# Patient Record
Sex: Female | Born: 1945 | Race: White | Hispanic: No | Smoking: Never smoker
Health system: Southern US, Community
[De-identification: ages and names within clinical notes are randomized; demographics above are authoritative.]

## PROBLEM LIST (undated history)

## (undated) DIAGNOSIS — E119 Type 2 diabetes mellitus without complications: Secondary | ICD-10-CM

## (undated) DIAGNOSIS — M1990 Unspecified osteoarthritis, unspecified site: Secondary | ICD-10-CM

## (undated) DIAGNOSIS — M109 Gout, unspecified: Secondary | ICD-10-CM

## (undated) DIAGNOSIS — D649 Anemia, unspecified: Secondary | ICD-10-CM

## (undated) DIAGNOSIS — Z860100 Personal history of colon polyps, unspecified: Secondary | ICD-10-CM

## (undated) DIAGNOSIS — N289 Disorder of kidney and ureter, unspecified: Secondary | ICD-10-CM

## (undated) DIAGNOSIS — C801 Malignant (primary) neoplasm, unspecified: Secondary | ICD-10-CM

## (undated) DIAGNOSIS — Z9889 Other specified postprocedural states: Secondary | ICD-10-CM

## (undated) DIAGNOSIS — J40 Bronchitis, not specified as acute or chronic: Secondary | ICD-10-CM

## (undated) DIAGNOSIS — I2699 Other pulmonary embolism without acute cor pulmonale: Secondary | ICD-10-CM

## (undated) DIAGNOSIS — E7800 Pure hypercholesterolemia, unspecified: Secondary | ICD-10-CM

## (undated) DIAGNOSIS — I10 Essential (primary) hypertension: Secondary | ICD-10-CM

## (undated) HISTORY — DX: Other pulmonary embolism without acute cor pulmonale: I26.99

## (undated) HISTORY — PX: CHOLECYSTECTOMY: SHX55

## (undated) HISTORY — DX: Anemia, unspecified: D64.9

## (undated) HISTORY — PX: PARTIAL HYSTERECTOMY: SHX80

## (undated) HISTORY — DX: Gout, unspecified: M10.9

## (undated) HISTORY — PX: KNEE ARTHROSCOPY: SHX127

## (undated) HISTORY — PX: TONSILLECTOMY: SUR1361

## (undated) HISTORY — PX: OTHER SURGICAL HISTORY: SHX169

## (undated) HISTORY — PX: THYROIDECTOMY, PARTIAL: SHX18

---

## 2011-06-14 HISTORY — PX: COLONOSCOPY: SHX174

## 2011-09-12 DIAGNOSIS — N189 Chronic kidney disease, unspecified: Secondary | ICD-10-CM | POA: Insufficient documentation

## 2012-11-02 DIAGNOSIS — E039 Hypothyroidism, unspecified: Secondary | ICD-10-CM | POA: Insufficient documentation

## 2012-11-15 DIAGNOSIS — E781 Pure hyperglyceridemia: Secondary | ICD-10-CM | POA: Insufficient documentation

## 2012-11-29 DIAGNOSIS — G4733 Obstructive sleep apnea (adult) (pediatric): Secondary | ICD-10-CM | POA: Insufficient documentation

## 2013-05-20 DIAGNOSIS — E785 Hyperlipidemia, unspecified: Secondary | ICD-10-CM | POA: Insufficient documentation

## 2016-07-04 DIAGNOSIS — M1712 Unilateral primary osteoarthritis, left knee: Secondary | ICD-10-CM | POA: Diagnosis not present

## 2016-07-04 DIAGNOSIS — M7051 Other bursitis of knee, right knee: Secondary | ICD-10-CM | POA: Diagnosis not present

## 2016-07-05 DIAGNOSIS — M1712 Unilateral primary osteoarthritis, left knee: Secondary | ICD-10-CM | POA: Diagnosis not present

## 2016-07-05 DIAGNOSIS — M7051 Other bursitis of knee, right knee: Secondary | ICD-10-CM | POA: Diagnosis not present

## 2016-07-25 DIAGNOSIS — J9691 Respiratory failure, unspecified with hypoxia: Secondary | ICD-10-CM | POA: Insufficient documentation

## 2016-07-25 DIAGNOSIS — J9621 Acute and chronic respiratory failure with hypoxia: Secondary | ICD-10-CM | POA: Diagnosis not present

## 2016-07-25 DIAGNOSIS — R748 Abnormal levels of other serum enzymes: Secondary | ICD-10-CM | POA: Diagnosis not present

## 2016-07-25 DIAGNOSIS — N183 Chronic kidney disease, stage 3 (moderate): Secondary | ICD-10-CM | POA: Diagnosis not present

## 2016-07-25 DIAGNOSIS — I2699 Other pulmonary embolism without acute cor pulmonale: Secondary | ICD-10-CM | POA: Diagnosis not present

## 2016-07-25 DIAGNOSIS — R079 Chest pain, unspecified: Secondary | ICD-10-CM | POA: Diagnosis not present

## 2016-07-25 DIAGNOSIS — Z79899 Other long term (current) drug therapy: Secondary | ICD-10-CM | POA: Diagnosis not present

## 2016-07-25 DIAGNOSIS — E039 Hypothyroidism, unspecified: Secondary | ICD-10-CM | POA: Diagnosis not present

## 2016-07-25 DIAGNOSIS — Z885 Allergy status to narcotic agent status: Secondary | ICD-10-CM | POA: Diagnosis not present

## 2016-07-25 DIAGNOSIS — I5189 Other ill-defined heart diseases: Secondary | ICD-10-CM | POA: Diagnosis not present

## 2016-07-25 DIAGNOSIS — E785 Hyperlipidemia, unspecified: Secondary | ICD-10-CM | POA: Diagnosis not present

## 2016-07-25 DIAGNOSIS — G4730 Sleep apnea, unspecified: Secondary | ICD-10-CM | POA: Diagnosis not present

## 2016-07-25 DIAGNOSIS — R0902 Hypoxemia: Secondary | ICD-10-CM | POA: Diagnosis not present

## 2016-07-25 DIAGNOSIS — Z888 Allergy status to other drugs, medicaments and biological substances status: Secondary | ICD-10-CM | POA: Diagnosis not present

## 2016-07-25 DIAGNOSIS — K219 Gastro-esophageal reflux disease without esophagitis: Secondary | ICD-10-CM | POA: Diagnosis not present

## 2016-07-25 DIAGNOSIS — J9601 Acute respiratory failure with hypoxia: Secondary | ICD-10-CM | POA: Diagnosis not present

## 2016-07-25 DIAGNOSIS — I129 Hypertensive chronic kidney disease with stage 1 through stage 4 chronic kidney disease, or unspecified chronic kidney disease: Secondary | ICD-10-CM | POA: Diagnosis present

## 2016-07-25 DIAGNOSIS — Z91040 Latex allergy status: Secondary | ICD-10-CM | POA: Diagnosis not present

## 2016-08-12 DIAGNOSIS — Z09 Encounter for follow-up examination after completed treatment for conditions other than malignant neoplasm: Secondary | ICD-10-CM | POA: Diagnosis not present

## 2016-08-12 DIAGNOSIS — I2699 Other pulmonary embolism without acute cor pulmonale: Secondary | ICD-10-CM | POA: Diagnosis not present

## 2016-08-12 DIAGNOSIS — I10 Essential (primary) hypertension: Secondary | ICD-10-CM | POA: Diagnosis not present

## 2016-08-12 DIAGNOSIS — N183 Chronic kidney disease, stage 3 (moderate): Secondary | ICD-10-CM | POA: Diagnosis not present

## 2016-08-18 DIAGNOSIS — K219 Gastro-esophageal reflux disease without esophagitis: Secondary | ICD-10-CM | POA: Insufficient documentation

## 2016-08-24 DIAGNOSIS — H1013 Acute atopic conjunctivitis, bilateral: Secondary | ICD-10-CM | POA: Diagnosis not present

## 2016-08-24 DIAGNOSIS — H2513 Age-related nuclear cataract, bilateral: Secondary | ICD-10-CM | POA: Diagnosis not present

## 2016-08-24 DIAGNOSIS — H524 Presbyopia: Secondary | ICD-10-CM | POA: Diagnosis not present

## 2016-08-24 DIAGNOSIS — H40053 Ocular hypertension, bilateral: Secondary | ICD-10-CM | POA: Diagnosis not present

## 2016-08-24 DIAGNOSIS — H16223 Keratoconjunctivitis sicca, not specified as Sjogren's, bilateral: Secondary | ICD-10-CM | POA: Diagnosis not present

## 2016-08-24 DIAGNOSIS — I10 Essential (primary) hypertension: Secondary | ICD-10-CM | POA: Diagnosis not present

## 2016-08-26 DIAGNOSIS — I2699 Other pulmonary embolism without acute cor pulmonale: Secondary | ICD-10-CM | POA: Diagnosis not present

## 2016-08-26 DIAGNOSIS — N183 Chronic kidney disease, stage 3 (moderate): Secondary | ICD-10-CM | POA: Diagnosis not present

## 2016-08-26 DIAGNOSIS — G4730 Sleep apnea, unspecified: Secondary | ICD-10-CM | POA: Diagnosis not present

## 2016-08-26 DIAGNOSIS — I129 Hypertensive chronic kidney disease with stage 1 through stage 4 chronic kidney disease, or unspecified chronic kidney disease: Secondary | ICD-10-CM | POA: Diagnosis not present

## 2016-08-26 DIAGNOSIS — R609 Edema, unspecified: Secondary | ICD-10-CM | POA: Diagnosis not present

## 2016-08-31 DIAGNOSIS — M170 Bilateral primary osteoarthritis of knee: Secondary | ICD-10-CM | POA: Diagnosis not present

## 2016-08-31 DIAGNOSIS — I129 Hypertensive chronic kidney disease with stage 1 through stage 4 chronic kidney disease, or unspecified chronic kidney disease: Secondary | ICD-10-CM | POA: Diagnosis not present

## 2016-08-31 DIAGNOSIS — N183 Chronic kidney disease, stage 3 (moderate): Secondary | ICD-10-CM | POA: Diagnosis not present

## 2016-08-31 DIAGNOSIS — Z6837 Body mass index (BMI) 37.0-37.9, adult: Secondary | ICD-10-CM | POA: Diagnosis not present

## 2016-08-31 DIAGNOSIS — I2699 Other pulmonary embolism without acute cor pulmonale: Secondary | ICD-10-CM | POA: Diagnosis not present

## 2016-08-31 DIAGNOSIS — E669 Obesity, unspecified: Secondary | ICD-10-CM | POA: Diagnosis not present

## 2016-08-31 DIAGNOSIS — K219 Gastro-esophageal reflux disease without esophagitis: Secondary | ICD-10-CM | POA: Diagnosis not present

## 2016-08-31 DIAGNOSIS — E782 Mixed hyperlipidemia: Secondary | ICD-10-CM | POA: Diagnosis not present

## 2016-09-08 DIAGNOSIS — E785 Hyperlipidemia, unspecified: Secondary | ICD-10-CM | POA: Diagnosis not present

## 2016-09-08 DIAGNOSIS — Z5181 Encounter for therapeutic drug level monitoring: Secondary | ICD-10-CM | POA: Diagnosis not present

## 2016-09-08 DIAGNOSIS — N189 Chronic kidney disease, unspecified: Secondary | ICD-10-CM | POA: Diagnosis not present

## 2016-09-08 DIAGNOSIS — I2699 Other pulmonary embolism without acute cor pulmonale: Secondary | ICD-10-CM | POA: Diagnosis not present

## 2016-09-08 DIAGNOSIS — D649 Anemia, unspecified: Secondary | ICD-10-CM | POA: Diagnosis not present

## 2016-09-08 DIAGNOSIS — Z7901 Long term (current) use of anticoagulants: Secondary | ICD-10-CM | POA: Diagnosis not present

## 2016-09-08 DIAGNOSIS — Z6838 Body mass index (BMI) 38.0-38.9, adult: Secondary | ICD-10-CM | POA: Diagnosis not present

## 2016-09-08 DIAGNOSIS — D631 Anemia in chronic kidney disease: Secondary | ICD-10-CM | POA: Diagnosis not present

## 2016-09-08 DIAGNOSIS — E669 Obesity, unspecified: Secondary | ICD-10-CM | POA: Diagnosis not present

## 2016-09-08 DIAGNOSIS — Z1211 Encounter for screening for malignant neoplasm of colon: Secondary | ICD-10-CM | POA: Diagnosis not present

## 2016-09-08 DIAGNOSIS — I129 Hypertensive chronic kidney disease with stage 1 through stage 4 chronic kidney disease, or unspecified chronic kidney disease: Secondary | ICD-10-CM | POA: Diagnosis not present

## 2016-09-08 DIAGNOSIS — Z6839 Body mass index (BMI) 39.0-39.9, adult: Secondary | ICD-10-CM | POA: Diagnosis not present

## 2016-09-26 DIAGNOSIS — H1013 Acute atopic conjunctivitis, bilateral: Secondary | ICD-10-CM | POA: Diagnosis not present

## 2016-09-26 DIAGNOSIS — H16223 Keratoconjunctivitis sicca, not specified as Sjogren's, bilateral: Secondary | ICD-10-CM | POA: Diagnosis not present

## 2016-09-26 DIAGNOSIS — H11433 Conjunctival hyperemia, bilateral: Secondary | ICD-10-CM | POA: Diagnosis not present

## 2016-10-04 DIAGNOSIS — M7051 Other bursitis of knee, right knee: Secondary | ICD-10-CM | POA: Diagnosis not present

## 2016-10-04 DIAGNOSIS — M1712 Unilateral primary osteoarthritis, left knee: Secondary | ICD-10-CM | POA: Diagnosis not present

## 2016-12-01 DIAGNOSIS — I10 Essential (primary) hypertension: Secondary | ICD-10-CM | POA: Diagnosis not present

## 2016-12-01 DIAGNOSIS — E782 Mixed hyperlipidemia: Secondary | ICD-10-CM | POA: Diagnosis not present

## 2016-12-01 DIAGNOSIS — E039 Hypothyroidism, unspecified: Secondary | ICD-10-CM | POA: Diagnosis not present

## 2016-12-02 DIAGNOSIS — E782 Mixed hyperlipidemia: Secondary | ICD-10-CM | POA: Diagnosis not present

## 2016-12-02 DIAGNOSIS — K219 Gastro-esophageal reflux disease without esophagitis: Secondary | ICD-10-CM | POA: Diagnosis not present

## 2016-12-02 DIAGNOSIS — N183 Chronic kidney disease, stage 3 (moderate): Secondary | ICD-10-CM | POA: Diagnosis not present

## 2016-12-02 DIAGNOSIS — Z780 Asymptomatic menopausal state: Secondary | ICD-10-CM | POA: Diagnosis not present

## 2016-12-02 DIAGNOSIS — I2699 Other pulmonary embolism without acute cor pulmonale: Secondary | ICD-10-CM | POA: Diagnosis not present

## 2016-12-02 DIAGNOSIS — G4733 Obstructive sleep apnea (adult) (pediatric): Secondary | ICD-10-CM | POA: Diagnosis not present

## 2016-12-02 DIAGNOSIS — I129 Hypertensive chronic kidney disease with stage 1 through stage 4 chronic kidney disease, or unspecified chronic kidney disease: Secondary | ICD-10-CM | POA: Diagnosis not present

## 2016-12-02 DIAGNOSIS — Z9989 Dependence on other enabling machines and devices: Secondary | ICD-10-CM | POA: Diagnosis not present

## 2016-12-02 DIAGNOSIS — E039 Hypothyroidism, unspecified: Secondary | ICD-10-CM | POA: Diagnosis not present

## 2016-12-07 DIAGNOSIS — Z13820 Encounter for screening for osteoporosis: Secondary | ICD-10-CM | POA: Diagnosis not present

## 2016-12-07 DIAGNOSIS — M8588 Other specified disorders of bone density and structure, other site: Secondary | ICD-10-CM | POA: Diagnosis not present

## 2016-12-07 DIAGNOSIS — Z780 Asymptomatic menopausal state: Secondary | ICD-10-CM | POA: Diagnosis not present

## 2016-12-09 DIAGNOSIS — N189 Chronic kidney disease, unspecified: Secondary | ICD-10-CM | POA: Diagnosis not present

## 2017-01-05 DIAGNOSIS — M1712 Unilateral primary osteoarthritis, left knee: Secondary | ICD-10-CM | POA: Diagnosis not present

## 2017-01-05 DIAGNOSIS — M7051 Other bursitis of knee, right knee: Secondary | ICD-10-CM | POA: Diagnosis not present

## 2017-01-08 DIAGNOSIS — M7051 Other bursitis of knee, right knee: Secondary | ICD-10-CM | POA: Diagnosis not present

## 2017-01-08 DIAGNOSIS — M1712 Unilateral primary osteoarthritis, left knee: Secondary | ICD-10-CM | POA: Diagnosis not present

## 2017-01-19 DIAGNOSIS — Z9989 Dependence on other enabling machines and devices: Secondary | ICD-10-CM | POA: Diagnosis not present

## 2017-01-19 DIAGNOSIS — I10 Essential (primary) hypertension: Secondary | ICD-10-CM | POA: Diagnosis not present

## 2017-01-19 DIAGNOSIS — G4733 Obstructive sleep apnea (adult) (pediatric): Secondary | ICD-10-CM | POA: Diagnosis not present

## 2017-03-28 DIAGNOSIS — H40053 Ocular hypertension, bilateral: Secondary | ICD-10-CM | POA: Diagnosis not present

## 2017-03-28 DIAGNOSIS — H5340 Unspecified visual field defects: Secondary | ICD-10-CM | POA: Diagnosis not present

## 2017-03-28 DIAGNOSIS — H539 Unspecified visual disturbance: Secondary | ICD-10-CM | POA: Diagnosis not present

## 2017-04-05 DIAGNOSIS — E039 Hypothyroidism, unspecified: Secondary | ICD-10-CM | POA: Diagnosis not present

## 2017-04-05 DIAGNOSIS — J0100 Acute maxillary sinusitis, unspecified: Secondary | ICD-10-CM | POA: Diagnosis not present

## 2017-04-05 DIAGNOSIS — N183 Chronic kidney disease, stage 3 (moderate): Secondary | ICD-10-CM | POA: Diagnosis not present

## 2017-04-05 DIAGNOSIS — H539 Unspecified visual disturbance: Secondary | ICD-10-CM | POA: Diagnosis not present

## 2017-04-05 DIAGNOSIS — I129 Hypertensive chronic kidney disease with stage 1 through stage 4 chronic kidney disease, or unspecified chronic kidney disease: Secondary | ICD-10-CM | POA: Diagnosis not present

## 2017-04-05 DIAGNOSIS — E782 Mixed hyperlipidemia: Secondary | ICD-10-CM | POA: Diagnosis not present

## 2017-04-05 DIAGNOSIS — I2699 Other pulmonary embolism without acute cor pulmonale: Secondary | ICD-10-CM | POA: Diagnosis not present

## 2017-04-11 DIAGNOSIS — I6782 Cerebral ischemia: Secondary | ICD-10-CM | POA: Diagnosis not present

## 2017-04-11 DIAGNOSIS — M1712 Unilateral primary osteoarthritis, left knee: Secondary | ICD-10-CM | POA: Diagnosis not present

## 2017-04-11 DIAGNOSIS — H5340 Unspecified visual field defects: Secondary | ICD-10-CM | POA: Diagnosis not present

## 2017-04-11 DIAGNOSIS — I6523 Occlusion and stenosis of bilateral carotid arteries: Secondary | ICD-10-CM | POA: Diagnosis not present

## 2017-04-11 DIAGNOSIS — H539 Unspecified visual disturbance: Secondary | ICD-10-CM | POA: Diagnosis not present

## 2017-04-11 DIAGNOSIS — M7051 Other bursitis of knee, right knee: Secondary | ICD-10-CM | POA: Diagnosis not present

## 2017-06-14 DIAGNOSIS — Z6838 Body mass index (BMI) 38.0-38.9, adult: Secondary | ICD-10-CM | POA: Diagnosis not present

## 2017-06-14 DIAGNOSIS — J0100 Acute maxillary sinusitis, unspecified: Secondary | ICD-10-CM | POA: Diagnosis not present

## 2017-06-14 DIAGNOSIS — I10 Essential (primary) hypertension: Secondary | ICD-10-CM | POA: Diagnosis not present

## 2017-06-14 DIAGNOSIS — Z86711 Personal history of pulmonary embolism: Secondary | ICD-10-CM | POA: Diagnosis not present

## 2017-07-11 DIAGNOSIS — I10 Essential (primary) hypertension: Secondary | ICD-10-CM | POA: Diagnosis not present

## 2017-07-11 DIAGNOSIS — Z0000 Encounter for general adult medical examination without abnormal findings: Secondary | ICD-10-CM | POA: Diagnosis not present

## 2017-07-13 DIAGNOSIS — M5137 Other intervertebral disc degeneration, lumbosacral region: Secondary | ICD-10-CM | POA: Diagnosis not present

## 2017-07-13 DIAGNOSIS — M9905 Segmental and somatic dysfunction of pelvic region: Secondary | ICD-10-CM | POA: Diagnosis not present

## 2017-07-17 DIAGNOSIS — M5137 Other intervertebral disc degeneration, lumbosacral region: Secondary | ICD-10-CM | POA: Diagnosis not present

## 2017-07-17 DIAGNOSIS — M9905 Segmental and somatic dysfunction of pelvic region: Secondary | ICD-10-CM | POA: Diagnosis not present

## 2017-07-18 DIAGNOSIS — I129 Hypertensive chronic kidney disease with stage 1 through stage 4 chronic kidney disease, or unspecified chronic kidney disease: Secondary | ICD-10-CM | POA: Diagnosis not present

## 2017-07-18 DIAGNOSIS — M1712 Unilateral primary osteoarthritis, left knee: Secondary | ICD-10-CM | POA: Diagnosis not present

## 2017-07-18 DIAGNOSIS — E7800 Pure hypercholesterolemia, unspecified: Secondary | ICD-10-CM | POA: Diagnosis not present

## 2017-07-18 DIAGNOSIS — N184 Chronic kidney disease, stage 4 (severe): Secondary | ICD-10-CM | POA: Diagnosis not present

## 2017-07-18 DIAGNOSIS — Z0000 Encounter for general adult medical examination without abnormal findings: Secondary | ICD-10-CM | POA: Diagnosis not present

## 2017-07-18 DIAGNOSIS — Z86711 Personal history of pulmonary embolism: Secondary | ICD-10-CM | POA: Diagnosis not present

## 2017-07-18 DIAGNOSIS — Z6838 Body mass index (BMI) 38.0-38.9, adult: Secondary | ICD-10-CM | POA: Diagnosis not present

## 2017-07-18 DIAGNOSIS — M7051 Other bursitis of knee, right knee: Secondary | ICD-10-CM | POA: Diagnosis not present

## 2017-07-18 DIAGNOSIS — K219 Gastro-esophageal reflux disease without esophagitis: Secondary | ICD-10-CM | POA: Diagnosis not present

## 2017-07-19 DIAGNOSIS — M9905 Segmental and somatic dysfunction of pelvic region: Secondary | ICD-10-CM | POA: Diagnosis not present

## 2017-07-19 DIAGNOSIS — M5137 Other intervertebral disc degeneration, lumbosacral region: Secondary | ICD-10-CM | POA: Diagnosis not present

## 2017-07-20 DIAGNOSIS — M9905 Segmental and somatic dysfunction of pelvic region: Secondary | ICD-10-CM | POA: Diagnosis not present

## 2017-07-20 DIAGNOSIS — M5137 Other intervertebral disc degeneration, lumbosacral region: Secondary | ICD-10-CM | POA: Diagnosis not present

## 2017-07-24 DIAGNOSIS — M9905 Segmental and somatic dysfunction of pelvic region: Secondary | ICD-10-CM | POA: Diagnosis not present

## 2017-07-24 DIAGNOSIS — M5137 Other intervertebral disc degeneration, lumbosacral region: Secondary | ICD-10-CM | POA: Diagnosis not present

## 2017-07-26 DIAGNOSIS — M5137 Other intervertebral disc degeneration, lumbosacral region: Secondary | ICD-10-CM | POA: Diagnosis not present

## 2017-07-26 DIAGNOSIS — M9905 Segmental and somatic dysfunction of pelvic region: Secondary | ICD-10-CM | POA: Diagnosis not present

## 2017-07-27 DIAGNOSIS — M5137 Other intervertebral disc degeneration, lumbosacral region: Secondary | ICD-10-CM | POA: Diagnosis not present

## 2017-07-27 DIAGNOSIS — M9905 Segmental and somatic dysfunction of pelvic region: Secondary | ICD-10-CM | POA: Diagnosis not present

## 2017-07-31 DIAGNOSIS — M9905 Segmental and somatic dysfunction of pelvic region: Secondary | ICD-10-CM | POA: Diagnosis not present

## 2017-07-31 DIAGNOSIS — M5137 Other intervertebral disc degeneration, lumbosacral region: Secondary | ICD-10-CM | POA: Diagnosis not present

## 2017-08-01 DIAGNOSIS — M9905 Segmental and somatic dysfunction of pelvic region: Secondary | ICD-10-CM | POA: Diagnosis not present

## 2017-08-01 DIAGNOSIS — M5137 Other intervertebral disc degeneration, lumbosacral region: Secondary | ICD-10-CM | POA: Diagnosis not present

## 2017-08-03 DIAGNOSIS — M9905 Segmental and somatic dysfunction of pelvic region: Secondary | ICD-10-CM | POA: Diagnosis not present

## 2017-08-03 DIAGNOSIS — M5137 Other intervertebral disc degeneration, lumbosacral region: Secondary | ICD-10-CM | POA: Diagnosis not present

## 2017-08-07 DIAGNOSIS — M9905 Segmental and somatic dysfunction of pelvic region: Secondary | ICD-10-CM | POA: Diagnosis not present

## 2017-08-07 DIAGNOSIS — M5137 Other intervertebral disc degeneration, lumbosacral region: Secondary | ICD-10-CM | POA: Diagnosis not present

## 2017-09-28 DIAGNOSIS — H40053 Ocular hypertension, bilateral: Secondary | ICD-10-CM | POA: Diagnosis not present

## 2017-10-03 DIAGNOSIS — J302 Other seasonal allergic rhinitis: Secondary | ICD-10-CM | POA: Diagnosis not present

## 2017-10-04 DIAGNOSIS — J302 Other seasonal allergic rhinitis: Secondary | ICD-10-CM | POA: Diagnosis not present

## 2017-10-05 DIAGNOSIS — J302 Other seasonal allergic rhinitis: Secondary | ICD-10-CM | POA: Diagnosis not present

## 2017-10-06 DIAGNOSIS — J302 Other seasonal allergic rhinitis: Secondary | ICD-10-CM | POA: Diagnosis not present

## 2017-10-07 DIAGNOSIS — J302 Other seasonal allergic rhinitis: Secondary | ICD-10-CM | POA: Diagnosis not present

## 2017-10-09 DIAGNOSIS — J302 Other seasonal allergic rhinitis: Secondary | ICD-10-CM | POA: Diagnosis not present

## 2017-10-10 DIAGNOSIS — J302 Other seasonal allergic rhinitis: Secondary | ICD-10-CM | POA: Diagnosis not present

## 2017-10-11 DIAGNOSIS — I2699 Other pulmonary embolism without acute cor pulmonale: Secondary | ICD-10-CM

## 2017-10-11 HISTORY — DX: Other pulmonary embolism without acute cor pulmonale: I26.99

## 2017-10-13 ENCOUNTER — Encounter (HOSPITAL_COMMUNITY): Payer: Self-pay | Admitting: Emergency Medicine

## 2017-10-13 ENCOUNTER — Emergency Department (HOSPITAL_COMMUNITY): Payer: Medicare Other

## 2017-10-13 ENCOUNTER — Observation Stay (HOSPITAL_COMMUNITY)
Admission: EM | Admit: 2017-10-13 | Discharge: 2017-10-14 | Disposition: A | Discharge status: Home / Self Care | Payer: Medicare Other | Attending: Internal Medicine | Admitting: Internal Medicine

## 2017-10-13 ENCOUNTER — Other Ambulatory Visit: Payer: Self-pay

## 2017-10-13 DIAGNOSIS — N289 Disorder of kidney and ureter, unspecified: Secondary | ICD-10-CM | POA: Diagnosis not present

## 2017-10-13 DIAGNOSIS — Z6839 Body mass index (BMI) 39.0-39.9, adult: Secondary | ICD-10-CM | POA: Diagnosis not present

## 2017-10-13 DIAGNOSIS — Z9889 Other specified postprocedural states: Secondary | ICD-10-CM

## 2017-10-13 DIAGNOSIS — Z86711 Personal history of pulmonary embolism: Secondary | ICD-10-CM | POA: Diagnosis not present

## 2017-10-13 DIAGNOSIS — I129 Hypertensive chronic kidney disease with stage 1 through stage 4 chronic kidney disease, or unspecified chronic kidney disease: Secondary | ICD-10-CM | POA: Diagnosis not present

## 2017-10-13 DIAGNOSIS — R0602 Shortness of breath: Secondary | ICD-10-CM | POA: Diagnosis not present

## 2017-10-13 DIAGNOSIS — N182 Chronic kidney disease, stage 2 (mild): Secondary | ICD-10-CM | POA: Insufficient documentation

## 2017-10-13 DIAGNOSIS — Z9049 Acquired absence of other specified parts of digestive tract: Secondary | ICD-10-CM | POA: Diagnosis not present

## 2017-10-13 DIAGNOSIS — M1990 Unspecified osteoarthritis, unspecified site: Secondary | ICD-10-CM | POA: Diagnosis present

## 2017-10-13 DIAGNOSIS — Z7901 Long term (current) use of anticoagulants: Secondary | ICD-10-CM | POA: Insufficient documentation

## 2017-10-13 DIAGNOSIS — I8290 Acute embolism and thrombosis of unspecified vein: Secondary | ICD-10-CM | POA: Insufficient documentation

## 2017-10-13 DIAGNOSIS — Z79899 Other long term (current) drug therapy: Secondary | ICD-10-CM | POA: Insufficient documentation

## 2017-10-13 DIAGNOSIS — Z888 Allergy status to other drugs, medicaments and biological substances status: Secondary | ICD-10-CM | POA: Insufficient documentation

## 2017-10-13 DIAGNOSIS — E7800 Pure hypercholesterolemia, unspecified: Secondary | ICD-10-CM | POA: Insufficient documentation

## 2017-10-13 DIAGNOSIS — K449 Diaphragmatic hernia without obstruction or gangrene: Secondary | ICD-10-CM | POA: Insufficient documentation

## 2017-10-13 DIAGNOSIS — I10 Essential (primary) hypertension: Secondary | ICD-10-CM | POA: Diagnosis not present

## 2017-10-13 DIAGNOSIS — Z860100 Personal history of colon polyps, unspecified: Secondary | ICD-10-CM

## 2017-10-13 DIAGNOSIS — Z86718 Personal history of other venous thrombosis and embolism: Secondary | ICD-10-CM | POA: Diagnosis not present

## 2017-10-13 DIAGNOSIS — I2699 Other pulmonary embolism without acute cor pulmonale: Principal | ICD-10-CM | POA: Diagnosis present

## 2017-10-13 DIAGNOSIS — E669 Obesity, unspecified: Secondary | ICD-10-CM | POA: Insufficient documentation

## 2017-10-13 DIAGNOSIS — Z886 Allergy status to analgesic agent status: Secondary | ICD-10-CM | POA: Diagnosis not present

## 2017-10-13 DIAGNOSIS — R05 Cough: Secondary | ICD-10-CM | POA: Diagnosis not present

## 2017-10-13 DIAGNOSIS — I700 Atherosclerosis of aorta: Secondary | ICD-10-CM | POA: Diagnosis not present

## 2017-10-13 DIAGNOSIS — Z885 Allergy status to narcotic agent status: Secondary | ICD-10-CM | POA: Diagnosis not present

## 2017-10-13 HISTORY — DX: Personal history of colon polyps, unspecified: Z86.0100

## 2017-10-13 HISTORY — DX: Pure hypercholesterolemia, unspecified: E78.00

## 2017-10-13 HISTORY — DX: Disorder of kidney and ureter, unspecified: N28.9

## 2017-10-13 HISTORY — DX: Other specified postprocedural states: Z86.010

## 2017-10-13 HISTORY — DX: Essential (primary) hypertension: I10

## 2017-10-13 HISTORY — DX: Unspecified osteoarthritis, unspecified site: M19.90

## 2017-10-13 HISTORY — DX: Bronchitis, not specified as acute or chronic: J40

## 2017-10-13 HISTORY — DX: Other specified postprocedural states: Z98.890

## 2017-10-13 LAB — HEPATIC FUNCTION PANEL
ALT: 31 U/L (ref 14–54)
AST: 39 U/L (ref 15–41)
Albumin: 4.2 g/dL (ref 3.5–5.0)
Alkaline Phosphatase: 99 U/L (ref 38–126)
Bilirubin, Direct: 0.1 mg/dL (ref 0.1–0.5)
Indirect Bilirubin: 0.7 mg/dL (ref 0.3–0.9)
Total Bilirubin: 0.8 mg/dL (ref 0.3–1.2)
Total Protein: 7.6 g/dL (ref 6.5–8.1)

## 2017-10-13 LAB — TROPONIN I
Troponin I: <0.03 ng/mL (ref <0.03)
Troponin I: <0.03 ng/mL (ref <0.03)

## 2017-10-13 LAB — CBC WITH DIFFERENTIAL/PLATELET
Basophils Absolute: 0 10*3/uL (ref 0.0–0.1)
Basophils Relative: 0 %
Eosinophils Absolute: 0.2 10*3/uL (ref 0.0–0.7)
Eosinophils Relative: 3 %
HCT: 38.4 % (ref 36.0–46.0)
Hemoglobin: 12.4 g/dL (ref 12.0–15.0)
Lymphocytes Relative: 26 %
Lymphs Abs: 2.3 10*3/uL (ref 0.7–4.0)
MCH: 29.5 pg (ref 26.0–34.0)
MCHC: 32.3 g/dL (ref 30.0–36.0)
MCV: 91.4 fL (ref 78.0–100.0)
Monocytes Absolute: 0.7 10*3/uL (ref 0.1–1.0)
Monocytes Relative: 7 %
Neutro Abs: 5.6 10*3/uL (ref 1.7–7.7)
Neutrophils Relative %: 64 %
Platelets: 252 10*3/uL (ref 150–400)
RBC: 4.2 MIL/uL (ref 3.87–5.11)
RDW: 14.6 % (ref 11.5–15.5)
WBC: 8.8 10*3/uL (ref 4.0–10.5)

## 2017-10-13 LAB — BASIC METABOLIC PANEL
Anion gap: 15 (ref 5–15)
BUN: 28 mg/dL — ABNORMAL HIGH (ref 6–20)
CO2: 23 mmol/L (ref 22–32)
Calcium: 9.7 mg/dL (ref 8.9–10.3)
Chloride: 97 mmol/L — ABNORMAL LOW (ref 101–111)
Creatinine, Ser: 1.58 mg/dL — ABNORMAL HIGH (ref 0.44–1.00)
GFR calc Af Amer: 37 mL/min — ABNORMAL LOW (ref >60)
GFR calc non Af Amer: 32 mL/min — ABNORMAL LOW (ref >60)
Glucose, Bld: 101 mg/dL — ABNORMAL HIGH (ref 65–99)
Potassium: 3.8 mmol/L (ref 3.5–5.1)
Sodium: 135 mmol/L (ref 135–145)

## 2017-10-13 LAB — D-DIMER, QUANTITATIVE (NOT AT ARMC): D-Dimer, Quant: 2.6 ug/mL-FEU — ABNORMAL HIGH (ref 0.00–0.50)

## 2017-10-13 LAB — PROTIME-INR
INR: 1.09
Prothrombin Time: 14 seconds (ref 11.4–15.2)

## 2017-10-13 MED ORDER — TRIAMTERENE-HCTZ 37.5-25 MG PO TABS
1.0000 | ORAL_TABLET | Freq: Every morning | ORAL | Status: DC
  Administered 2017-10-14: 1 via ORAL
  Filled 2017-10-13: qty 1

## 2017-10-13 MED ORDER — IOPAMIDOL (ISOVUE-370) INJECTION 76%
100.0000 mL | Freq: Once | INTRAVENOUS | Status: AC | PRN
  Administered 2017-10-13: 75 mL via INTRAVENOUS

## 2017-10-13 MED ORDER — SODIUM CHLORIDE 0.9 % IV BOLUS
1000.0000 mL | Freq: Once | INTRAVENOUS | Status: AC
  Administered 2017-10-13: 1000 mL via INTRAVENOUS

## 2017-10-13 MED ORDER — HEPARIN BOLUS VIA INFUSION
4000.0000 [IU] | Freq: Once | INTRAVENOUS | Status: AC
  Administered 2017-10-13: 4000 [IU] via INTRAVENOUS

## 2017-10-13 MED ORDER — SODIUM CHLORIDE 0.9% FLUSH: 3.0000 mL | INTRAVENOUS | Status: DC | PRN

## 2017-10-13 MED ORDER — SODIUM CHLORIDE 0.9 % IV SOLN: 250.0000 mL | INTRAVENOUS | Status: DC | PRN

## 2017-10-13 MED ORDER — CYCLOBENZAPRINE HCL 10 MG PO TABS: 10.0000 mg | ORAL_TABLET | Freq: Every day | ORAL | Status: DC | PRN

## 2017-10-13 MED ORDER — ONDANSETRON HCL 4 MG/2ML IJ SOLN: 4.0000 mg | Freq: Four times a day (QID) | INTRAMUSCULAR | Status: DC | PRN

## 2017-10-13 MED ORDER — ALBUTEROL SULFATE (2.5 MG/3ML) 0.083% IN NEBU: 5.0000 mg | INHALATION_SOLUTION | Freq: Once | RESPIRATORY_TRACT | Status: DC

## 2017-10-13 MED ORDER — FUROSEMIDE 20 MG PO TABS
20.0000 mg | ORAL_TABLET | Freq: Every day | ORAL | Status: DC | PRN
Start: 2017-10-13 — End: 2017-10-15

## 2017-10-13 MED ORDER — HEPARIN (PORCINE) IN NACL 100-0.45 UNIT/ML-% IJ SOLN
1300.0000 [IU]/h | INTRAMUSCULAR | Status: DC
Start: 2017-10-13 — End: 2017-10-14
  Administered 2017-10-13: 1000 [IU]/h via INTRAVENOUS
  Filled 2017-10-13: qty 250

## 2017-10-13 MED ORDER — ONDANSETRON HCL 4 MG PO TABS: 4.0000 mg | ORAL_TABLET | Freq: Four times a day (QID) | ORAL | Status: DC | PRN

## 2017-10-13 MED ORDER — SODIUM CHLORIDE 0.9% FLUSH
3.0000 mL | Freq: Two times a day (BID) | INTRAVENOUS | Status: DC
  Administered 2017-10-14: 3 mL via INTRAVENOUS

## 2017-10-13 MED ORDER — LISINOPRIL 10 MG PO TABS
40.0000 mg | ORAL_TABLET | Freq: Every evening | ORAL | Status: DC
  Administered 2017-10-13: 40 mg via ORAL
  Filled 2017-10-13: qty 4

## 2017-10-13 NOTE — Progress Notes (Signed)
ANTICOAGULATION CONSULT NOTE - Initial Consult  Pharmacy Consult for Heparin Indication: pulmonary embolus  Allergies  Allergen Reactions  . Hydrocodone Other (See Comments)    Altered mental status-Hallucination  . Naproxen Other (See Comments)    Damage to kidney Kidney damage   . Statins Other (See Comments)    Unknown reaction    Patient Measurements: Height: 5\' 6"  (167.6 cm) Weight: 242 lb (109.8 kg) IBW/kg (Calculated) : 59.3  HEPARIN DW (KG): 84.8  Vital Signs: Temp: 97.9 F (36.6 C) (05/03 1551) Temp Source: Oral (05/03 1551) BP: 185/84 (05/03 2006) Pulse Rate: 88 (05/03 2007)  Labs: Recent Labs    10/13/17 1635  HGB 12.4  HCT 38.4  PLT 252  CREATININE 1.58*  TROPONINI <0.03    Estimated Creatinine Clearance: 41 mL/min (A) (by C-G formula based on SCr of 1.58 mg/dL (H)).   Medical History: Past Medical History:  Diagnosis Date  . Arthritis   . Bronchitis   . H/O colonoscopy with polypectomy   . Hypercholesteremia   . Hypertension   . Renal disorder     Medications:  See med rec  Assessment: 72 y.o. 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 She presents to ED with over 2 weeks of worsening shortness of breath and dyspnea on exertion.  No pain or swelling in  her legs. Patient found to have bilateral pulmonary emboli again and referred for admission . Pharmacy asked to start heparin.   Goal of Therapy:  Heparin level 0.3-0.7 units/ml Monitor platelets by anticoagulation protocol: Yes   Plan:  Give 4000 units bolus x 1 Start heparin infusion at 1400 units/hr Check anti-Xa level in 6-8 hours and daily while on heparin Continue to monitor H&H and platelets F/U plan for transition to po  Isac Sarna, BS Vena [place], BCPS Clinical Pharmacist Pager [phone] 10/13/2017,8:35 PM

## 2017-10-13 NOTE — ED Notes (Signed)
Lung sounds clear but decreased

## 2017-10-13 NOTE — ED Provider Notes (Signed)
[facility] EMERGENCY DEPARTMENT Provider Note   CSN: [id number] Arrival date & time: 10/13/17  1533     History   Chief Complaint Chief Complaint  Patient presents with  . Shortness of Breath    HPI Lauren Hamilton is a 72 y.o. female.  Patient complains of shortness of breath.  Patient has a history of PE.  And has been off her blood thinner for about 3 months and has been short of breath for 3 weeks  The history is provided by the patient. No language interpreter was used.  Shortness of Breath  This is a recurrent problem. The current episode started more than 1 week ago. The problem has not changed since onset.Pertinent negatives include no fever, no headaches, no cough, no chest pain, no abdominal pain and no rash. Precipitated by: Unknown. Risk factors: Obese. She has tried nothing for the symptoms. The treatment provided no relief. She has had prior hospitalizations. She has had prior ED visits. She has had prior ICU admissions. Associated medical issues do not include asthma.    Past Medical History:  Diagnosis Date  . Arthritis   . Bronchitis   . Hypercholesteremia   . Hypertension   . Renal disorder     Patient Active Problem List   Diagnosis Date Noted  . Pulmonary embolism ([place]) 10/13/2017    Past Surgical History:  Procedure Laterality Date  . carpel tunnel    . CHOLECYSTECTOMY    . KNEE ARTHROSCOPY       OB History   None      Home Medications    Prior to Admission medications   Medication Sig Start Date End Date Taking? Authorizing Provider  Artificial Tear Solution (SOOTHE XP) SOLN Apply 1-2 drops to eye daily.   Yes [provider]  cetirizine (ZYRTEC) 10 MG tablet Take 10 mg by mouth every morning.   Yes [provider]  cyclobenzaprine (FLEXERIL) 10 MG tablet Take 10 mg by mouth daily as needed for muscle spasms.   Yes [provider]  furosemide (LASIX) 20 MG tablet Take 20 mg by mouth daily as needed for fluid.    Yes [provider]  lisinopril (PRINIVIL,ZESTRIL) 40 MG tablet Take 40 mg by mouth every evening.  10/03/17  Yes [provider]  lovastatin (MEVACOR) 10 MG tablet Take 10 mg by mouth at bedtime.   Yes [provider]  omeprazole (PRILOSEC) 20 MG capsule Take 20 mg by mouth every morning.   Yes [provider]  SALINE NASAL MIST NA Place 1-2 sprays into the nose daily as needed.   Yes [provider]  triamterene-hydrochlorothiazide (MAXZIDE-25) 37.5-25 MG tablet Take 1 tablet by mouth every morning.   Yes [provider]    Family History History reviewed. No pertinent family history.  Social History Social History   Tobacco Use  . Smoking status: Never Smoker  . Smokeless tobacco: Never Used  Substance Use Topics  . Alcohol use: Never    Frequency: Never  . Drug use: Never     Allergies   Hydrocodone; Naproxen; and Statins   Review of Systems Review of Systems  Constitutional: Negative for appetite change, fatigue and fever.  HENT: Negative for congestion, ear discharge and sinus pressure.   Eyes: Negative for discharge.  Respiratory: Positive for shortness of breath. Negative for cough.   Cardiovascular: Negative for chest pain.  Gastrointestinal: Negative for abdominal pain and diarrhea.  Genitourinary: Negative for frequency and hematuria.  Musculoskeletal:  Negative for back pain.  Skin: Negative for rash.  Neurological: Negative for seizures and headaches.  Psychiatric/Behavioral: Negative for hallucinations.     Physical Exam Updated Vital Signs BP 138/79   Pulse 95   Temp 97.9 F (36.6 C) (Oral)   Resp 20   SpO2 97%   Physical Exam  Constitutional: She is oriented to person, place, and time. She appears well-developed.  HENT:  Head: Normocephalic.  Eyes: Conjunctivae and EOM are normal. No scleral icterus.  Neck: Neck supple. No thyromegaly present.  Cardiovascular: Normal rate and regular  rhythm. Exam reveals no gallop and no friction rub.  No murmur heard. Pulmonary/Chest: No stridor. She has no wheezes. She has no rales. She exhibits no tenderness.  Abdominal: She exhibits no distension. There is no tenderness. There is no rebound.  Musculoskeletal: Normal range of motion. She exhibits no edema.  Lymphadenopathy:    She has no cervical adenopathy.  Neurological: She is oriented to person, place, and time. She exhibits normal muscle tone. Coordination normal.  Skin: No rash noted. No erythema.  Psychiatric: She has a normal mood and affect. Her behavior is normal.     ED Treatments / Results  Labs (all labs ordered are listed, but only abnormal results are displayed) Labs Reviewed  BASIC METABOLIC PANEL - Abnormal; Notable for the following components:      Result Value   Chloride 97 (*)    Glucose, Bld 101 (*)    BUN 28 (*)    Creatinine, Ser 1.58 (*)    GFR calc non Af Amer 32 (*)    GFR calc Af Amer 37 (*)    All other components within normal limits  D-DIMER, QUANTITATIVE (NOT AT [facility]) - Abnormal; Notable for the following components:   D-Dimer, Quant 2.60 (*)    All other components within normal limits  CBC WITH DIFFERENTIAL/PLATELET  TROPONIN I  HEPATIC FUNCTION PANEL    EKG None  Radiology Dg Chest 2 View  Result Date: 10/13/2017 CLINICAL DATA:  Cough and short of breath EXAM: CHEST - 2 VIEW COMPARISON:  None. FINDINGS: The heart size and mediastinal contours are within normal limits. Both lungs are clear. The visualized skeletal structures are unremarkable. IMPRESSION: No active cardiopulmonary disease. Electronically Signed   By: Franchot Gallo M.D.   On: 10/13/2017 16:05   Ct Angio Chest Pe W And/or Wo Contrast  Result Date: 10/13/2017 CLINICAL DATA:  Worsening shortness of breath. History of blood clots. EXAM: CT ANGIOGRAPHY CHEST WITH CONTRAST TECHNIQUE: Multidetector CT imaging of the chest was performed using the standard protocol during bolus  administration of intravenous contrast. Multiplanar CT image reconstructions and MIPs were obtained to evaluate the vascular anatomy. CONTRAST:  39mL ISOVUE-370 IOPAMIDOL (ISOVUE-370) INJECTION 76% COMPARISON:  Chest x-ray from same day. FINDINGS: Cardiovascular: Lobar and segmental pulmonary emboli involving the right upper and lower lobes. Segmental pulmonary emboli involving the left upper lobe, left lower lobe, and right middle lobe. No right heart strain. RV/LV ratio is 0.85. Borderline cardiomegaly. Trace pericardial effusion. Normal caliber thoracic aorta. Coronary, aortic arch, and branch vessel atherosclerotic vascular disease. Mediastinum/Nodes: No enlarged mediastinal, hilar, or axillary lymph nodes. Prior left hemithyroidectomy. The trachea and esophagus demonstrate no significant findings. Small hiatal hernia. Lungs/Pleura: Scattered geographic ground-glass density throughout both lungs could reflect atelectasis versus mosaic perfusion. No focal consolidation, pleural effusion, or pneumothorax. No suspicious pulmonary nodule. Upper Abdomen: No acute abnormality. Musculoskeletal: No chest wall abnormality. No acute or significant osseous findings. Review  of the MIP images confirms the above findings. IMPRESSION: 1. Bilateral lobar and segmental pulmonary emboli with borderline right heart strain. RV/LV ratio is 0.85. 2. Borderline cardiomegaly.  Trace pericardial effusion. 3.  Aortic atherosclerosis (ICD10-I70.0). Critical Value/emergent results were called by telephone at the time of interpretation on 10/13/2017 at 7:32 pm to Dr. Milton Ferguson , who verbally acknowledged these results. Electronically Signed   By: Titus Dubin M.D.   On: 10/13/2017 19:35    Procedures Procedures (including critical care time)  Medications Ordered in ED Medications  sodium chloride 0.9 % bolus 1,000 mL (1,000 mLs Intravenous New Bag/Given 10/13/17 1936)  heparin bolus via infusion 4,000 Units (has no  administration in time range)  heparin ADULT infusion 100 units/mL (25000 units/262mL sodium chloride 0.45%) (has no administration in time range)  iopamidol (ISOVUE-370) 76 % injection 100 mL (75 mLs Intravenous Contrast Given 10/13/17 1903)     Initial Impression / Assessment and Plan / ED Course  I have reviewed the triage vital signs and the nursing notes.  Pertinent labs & imaging results that were available during my care of the patient were reviewed by me and considered in my medical decision making (see chart for details).    CRITICAL CARE Performed by: Milton Ferguson Total critical care time: 45 minutes Critical care time was exclusive of separately billable procedures and treating other patients. Critical care was necessary to treat or prevent imminent or life-threatening deterioration. Critical care was time spent personally by me on the following activities: development of treatment plan with patient and/or surrogate as well as nursing, discussions with consultants, evaluation of patient's response to treatment, examination of patient, obtaining history from patient or surrogate, ordering and performing treatments and interventions, ordering and review of laboratory studies, ordering and review of radiographic studies, pulse oximetry and re-evaluation of patient's condition.   Patient with bilateral PEs seen on CT angios.  She will be placed on heparin and admitted to medicine  Final Clinical Impressions(s) / ED Diagnoses   Final diagnoses:  PE (pulmonary thromboembolism) [facility])    ED Discharge Orders    None       Milton Ferguson, MD 10/13/17 2006

## 2017-10-13 NOTE — ED Notes (Signed)
ED Provider at bedside. 

## 2017-10-13 NOTE — ED Notes (Signed)
Vitals obtained.  Pt knitting in the room and wants to ambulate in the room or sit in the chair so unable to having continuous VS

## 2017-10-13 NOTE — H&P (Signed)
History and Physical    Lyndy Russman ZCH:[id number] DOB: Aug 01, 1945 DOA: 10/13/2017  PCP: Medicine, [facility]  Patient coming from: Home  Chief Complaint: Shortness of breath  HPI: Monserrath Junio is a 72 y.o. 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  Patient denies any swelling her legs.  She called her primary care provider who advised her to come the emergency department for concerns of another PE.  Patient has been off of her anticoagulation since February.  She did complete a year therapy for her last PE.  Patient reports she did not get a cancer work-up from her last pulmonary emboli.  She has a history of having 3 polyps removed from her last colonoscopy which was 3 years ago and she is refused to have another one since.  She denies any rectal bleeding.  She gets her yearly mammograms done and they have been normal per her report.  Patient found to have bilateral pulmonary emboli again and referred for admission for such.  She is afebrile her vital signs are stable.  She is in start on heparin drip.   Review of Systems: As per HPI otherwise 10 point review of systems negative.   Past Medical History:  Diagnosis Date  . Arthritis   . Bronchitis   . H/O colonoscopy with polypectomy   . Hypercholesteremia   . Hypertension   . Renal disorder     Past Surgical History:  Procedure Laterality Date  . carpel tunnel    . CHOLECYSTECTOMY    . KNEE ARTHROSCOPY       reports that she has never smoked. She has never used smokeless tobacco. She reports that she does not drink alcohol or use drugs.  Allergies  Allergen Reactions  . Hydrocodone Other (See Comments)    Altered mental status-Hallucination  . Naproxen Other (See Comments)   Damage to kidney Kidney damage   . Statins Other (See Comments)    Unknown reaction    History reviewed. No pertinent family history.  No premature coronary artery disease  Prior to Admission medications   Medication Sig Start Date End Date Taking? Authorizing Provider  Artificial Tear Solution (SOOTHE XP) SOLN Apply 1-2 drops to eye daily.   Yes [provider]  cetirizine (ZYRTEC) 10 MG tablet Take 10 mg by mouth every morning.   Yes [provider]  cyclobenzaprine (FLEXERIL) 10 MG tablet Take 10 mg by mouth daily as needed for muscle spasms.   Yes [provider]  furosemide (LASIX) 20 MG tablet Take 20 mg by mouth daily as needed for fluid.   Yes [provider]  lisinopril (PRINIVIL,ZESTRIL) 40 MG tablet Take 40 mg by mouth every evening.  10/03/17  Yes [provider]  lovastatin (MEVACOR) 10 MG tablet Take 10 mg by mouth at bedtime.   Yes [provider]  omeprazole (PRILOSEC) 20 MG capsule Take 20 mg by mouth every morning.   Yes [provider]  SALINE NASAL MIST NA Place 1-2 sprays into the nose daily as needed.   Yes [provider]  triamterene-hydrochlorothiazide (MAXZIDE-25) 37.5-25 MG tablet Take 1 tablet by mouth every morning.   Yes [provider]    Physical Exam: Vitals:   10/13/17 1551 10/13/17 1746 10/13/17 1748  10/13/17 1939  BP: (!) 154/90 138/79    Pulse: 90  85 95  Resp: 20   20  Temp: 97.9 F (36.6 C)     TempSrc: Oral     SpO2: 95%  93% 97%    Constitutional: NAD, calm, comfortable Vitals:   10/13/17 1551 10/13/17 1746 10/13/17 1748 10/13/17 1939  BP: (!) 154/90 138/79    Pulse: 90  85 95  Resp: 20   20  Temp: 97.9 F (36.6 C)     TempSrc: Oral     SpO2: 95%  93% 97%   Eyes: PERRL, lids and conjunctivae normal ENMT: Mucous membranes are moist. Posterior pharynx clear of any exudate or lesions.Normal dentition.  Neck: normal, supple, no masses, no  thyromegaly Respiratory: clear to auscultation bilaterally, no wheezing, no crackles. Normal respiratory effort. No accessory muscle use.  Cardiovascular: Regular rate and rhythm, no murmurs / rubs / gallops. No extremity edema. 2+ pedal pulses. No carotid bruits.  Abdomen: no tenderness, no masses palpated. No hepatosplenomegaly. Bowel sounds positive.  Musculoskeletal: no clubbing / cyanosis. No joint deformity upper and lower extremities. Good ROM, no contractures. Normal muscle tone.  Skin: no rashes, lesions, ulcers. No induration Neurologic: CN 2-12 grossly intact. Sensation intact, DTR normal. Strength 5/5 in all 4.  Psychiatric: Normal judgment and insight. Alert and oriented x 3. Normal mood.    Labs on Admission: I have personally reviewed following labs and imaging studies  CBC: Recent Labs  Lab 10/13/17 1635  WBC 8.8  NEUTROABS 5.6  HGB 12.4  HCT 38.4  MCV 91.4  PLT 789   Basic Metabolic Panel: Recent Labs  Lab 10/13/17 1635  NA 135  K 3.8  CL 97*  CO2 23  GLUCOSE 101*  BUN 28*  CREATININE 1.58*  CALCIUM 9.7   GFR: CrCl cannot be calculated (Unknown ideal weight.). Liver Function Tests: Recent Labs  Lab 10/13/17 1723  AST 39  ALT 31  ALKPHOS 99  BILITOT 0.8  PROT 7.6  ALBUMIN 4.2   No results for input(s): LIPASE, AMYLASE in the last 168 hours. No results for input(s): AMMONIA in the last 168 hours. Coagulation Profile: No results for input(s): INR, PROTIME in the last 168 hours. Cardiac Enzymes: Recent Labs  Lab 10/13/17 1635  TROPONINI <0.03   BNP (last 3 results) No results for input(s): PROBNP in the last 8760 hours. HbA1C: No results for input(s): HGBA1C in the last 72 hours. CBG: No results for input(s): GLUCAP in the last 168 hours. Lipid Profile: No results for input(s): CHOL, HDL, LDLCALC, TRIG, CHOLHDL, LDLDIRECT in the last 72 hours. Thyroid Function Tests: No results for input(s): TSH, T4TOTAL, FREET4, T3FREE, THYROIDAB in  the last 72 hours. Anemia Panel: No results for input(s): VITAMINB12, FOLATE, FERRITIN, TIBC, IRON, RETICCTPCT in the last 72 hours. Urine analysis: No results found for: COLORURINE, APPEARANCEUR, LABSPEC, PHURINE, GLUCOSEU, HGBUR, BILIRUBINUR, KETONESUR, PROTEINUR, UROBILINOGEN, NITRITE, LEUKOCYTESUR Sepsis Labs: !!!!!!!!!!!!!!!!!!!!!!!!!!!!!!!!!!!!!!!!!!!! @LABRCNTIP (procalcitonin:4,lacticidven:4) )No results found for this or any previous visit (from the past 240 hour(s)).   Radiological Exams on Admission: Dg Chest 2 View  Result Date: 10/13/2017 CLINICAL DATA:  Cough and short of breath EXAM: CHEST - 2 VIEW COMPARISON:  None. FINDINGS: The heart size and mediastinal contours are within normal limits. Both lungs are clear. The visualized skeletal structures are unremarkable. IMPRESSION: No active cardiopulmonary disease. Electronically Signed   By: Franchot Gallo M.D.   On: 10/13/2017 16:05   Ct Angio Chest Pe W And/or Wo  Contrast  Result Date: 10/13/2017 CLINICAL DATA:  Worsening shortness of breath. History of blood clots. EXAM: CT ANGIOGRAPHY CHEST WITH CONTRAST TECHNIQUE: Multidetector CT imaging of the chest was performed using the standard protocol during bolus administration of intravenous contrast. Multiplanar CT image reconstructions and MIPs were obtained to evaluate the vascular anatomy. CONTRAST:  37mL ISOVUE-370 IOPAMIDOL (ISOVUE-370) INJECTION 76% COMPARISON:  Chest x-ray from same day. FINDINGS: Cardiovascular: Lobar and segmental pulmonary emboli involving the right upper and lower lobes. Segmental pulmonary emboli involving the left upper lobe, left lower lobe, and right middle lobe. No right heart strain. RV/LV ratio is 0.85. Borderline cardiomegaly. Trace pericardial effusion. Normal caliber thoracic aorta. Coronary, aortic arch, and branch vessel atherosclerotic vascular disease. Mediastinum/Nodes: No enlarged mediastinal, hilar, or axillary lymph nodes. Prior left  hemithyroidectomy. The trachea and esophagus demonstrate no significant findings. Small hiatal hernia. Lungs/Pleura: Scattered geographic ground-glass density throughout both lungs could reflect atelectasis versus mosaic perfusion. No focal consolidation, pleural effusion, or pneumothorax. No suspicious pulmonary nodule. Upper Abdomen: No acute abnormality. Musculoskeletal: No chest wall abnormality. No acute or significant osseous findings. Review of the MIP images confirms the above findings. IMPRESSION: 1. Bilateral lobar and segmental pulmonary emboli with borderline right heart strain. RV/LV ratio is 0.85. 2. Borderline cardiomegaly.  Trace pericardial effusion. 3.  Aortic atherosclerosis (ICD10-I70.0). Critical Value/emergent results were called by telephone at the time of interpretation on 10/13/2017 at 7:32 pm to Dr. Milton Ferguson , who verbally acknowledged these results. Electronically Signed   By: Titus Dubin M.D.   On: 10/13/2017 19:35    EKG: Independently reviewed.  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  Troponin is normal.  She will need lifelong anticoagulation at this time.  I discussed with her-the importance of cancer screening including colonoscopy she is going to think about this.  She is due for one due to her polypectomy x3 about 3 years ago.  Place patient on heparin drip.  Can transition back to Xarelto.  She cannot afford her medication in the past and she was getting free samples from her primary care physician every month.  We will leave up to PCP on whether to put her on Coumadin for life.  Active Problems:   H/O colonoscopy with  polypectomy-as above considering repeating colonoscopy   Hypertension stable-   Arthritis-stable   Renal disorder stable-    DVT prophylaxis: Heparin drip Code Status: Full Family Communication: Daughter Disposition Plan: Per day team Consults called: None Admission status: Observation   DAVID,RACHAL A MD [facility]  If 7PM-7AM, please contact night-coverage [email or web address] Password [email or web address]  10/13/2017, 8:08 PM

## 2017-10-13 NOTE — ED Triage Notes (Addendum)
Pt c/o sob. Hx of bilateral blood clots. Sob since then which was months ago. Sob mildy worse x 2 weeks. A/o. Ambulatory. Denies pain. No ble edema noted. Prod cough with yellow phlegm x 3 months

## 2017-10-14 DIAGNOSIS — I2699 Other pulmonary embolism without acute cor pulmonale: Secondary | ICD-10-CM | POA: Diagnosis not present

## 2017-10-14 DIAGNOSIS — I2602 Saddle embolus of pulmonary artery with acute cor pulmonale: Secondary | ICD-10-CM | POA: Diagnosis not present

## 2017-10-14 LAB — BASIC METABOLIC PANEL
Anion gap: 11 (ref 5–15)
BUN: 23 mg/dL — ABNORMAL HIGH (ref 6–20)
CO2: 23 mmol/L (ref 22–32)
Calcium: 9.3 mg/dL (ref 8.9–10.3)
Chloride: 103 mmol/L (ref 101–111)
Creatinine, Ser: 1.34 mg/dL — ABNORMAL HIGH (ref 0.44–1.00)
GFR calc Af Amer: 45 mL/min — ABNORMAL LOW (ref >60)
GFR calc non Af Amer: 39 mL/min — ABNORMAL LOW (ref >60)
Glucose, Bld: 108 mg/dL — ABNORMAL HIGH (ref 65–99)
Potassium: 4 mmol/L (ref 3.5–5.1)
Sodium: 137 mmol/L (ref 135–145)

## 2017-10-14 LAB — TROPONIN I
Troponin I: <0.03 ng/mL (ref <0.03)
Troponin I: <0.03 ng/mL (ref <0.03)

## 2017-10-14 LAB — CBC
HCT: 36.2 % (ref 36.0–46.0)
Hemoglobin: 11.7 g/dL — ABNORMAL LOW (ref 12.0–15.0)
MCH: 29.6 pg (ref 26.0–34.0)
MCHC: 32.3 g/dL (ref 30.0–36.0)
MCV: 91.6 fL (ref 78.0–100.0)
Platelets: 241 10*3/uL (ref 150–400)
RBC: 3.95 MIL/uL (ref 3.87–5.11)
RDW: 14.7 % (ref 11.5–15.5)
WBC: 8.4 10*3/uL (ref 4.0–10.5)

## 2017-10-14 LAB — MRSA PCR SCREENING: MRSA by PCR: NEGATIVE

## 2017-10-14 LAB — ANTITHROMBIN III: AntiThromb III Func: 103 % (ref 75–120)

## 2017-10-14 LAB — HEPARIN LEVEL (UNFRACTIONATED): Heparin Unfractionated: 0.77 IU/mL — ABNORMAL HIGH (ref 0.30–0.70)

## 2017-10-14 MED ORDER — RIVAROXABAN (XARELTO) VTE STARTER PACK (15 & 20 MG): ORAL_TABLET | ORAL | 0 refills | Status: DC

## 2017-10-14 MED ORDER — RIVAROXABAN 15 MG PO TABS
15.0000 mg | ORAL_TABLET | Freq: Two times a day (BID) | ORAL | Status: DC
  Administered 2017-10-14 (×2): 15 mg via ORAL
  Filled 2017-10-14 (×3): qty 1

## 2017-10-14 MED ORDER — LISINOPRIL 10 MG PO TABS
40.0000 mg | ORAL_TABLET | Freq: Every morning | ORAL | Status: DC
  Administered 2017-10-14: 40 mg via ORAL
  Filled 2017-10-14: qty 4

## 2017-10-14 MED ORDER — RIVAROXABAN 20 MG PO TABS: 20.0000 mg | ORAL_TABLET | Freq: Every day | ORAL | 3 refills | Status: DC

## 2017-10-14 NOTE — Discharge Summary (Addendum)
Physician Discharge Summary  Adri Schloss ZOX:[id number] DOB: 1945/10/28 DOA: 10/13/2017  PCP: [facility]  Admit date: 10/13/2017 Discharge date: 10/14/2017  Time spent: 45 minutes  Recommendations for Outpatient Follow-up:  -To be discharged home today. -Has been started on Xarelto for pulmonary embolism.  As this is her second event she is now on lifelong anticoagulation. -She has been provided a free 30-day card, has been instructed to follow-up with PCP within the next 1 to 2 weeks to discuss further assistance with anticoagulation. -Hypercoagulable panel has been ordered and will need to be followed at time of hospital follow-up. -She also needs to be up-to-date on her cancer screening, she is due for repeat colonoscopy. -Desaturates to the mid 80s with ambulation although remains in the low 90s at rest.  We will send her home with 2 L of oxygen to use with activity.  Suspect this can be discontinued over the course of the next 2 to 3 weeks, oxygen levels will need to be assessed at time of hospital follow-up.  Discharge Diagnoses:  Principal Problem:   Pulmonary embolism ([place]) Active Problems:   Hypertension   Arthritis   Renal disorder   H/O colonoscopy with polypectomy   Discharge Condition: Stable and improved  Filed Weights   10/13/17 2033 10/14/17 0400  Weight: 109.8 kg (242 lb) 111.2 kg (245 lb 2.4 oz)    History of present illness:  As per Dr. Shanon Brow on 5/3: Lauren Hamilton is a 72 y.o. 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  Patient denies any swelling her legs.  She called her primary care provider who advised her to come the emergency department for concerns of another PE.  Patient has been off of her  anticoagulation since February.  She did complete a year therapy for her last PE.  Patient reports she did not get a cancer work-up from her last pulmonary emboli.  She has a history of having 3 polyps removed from her last colonoscopy which was 3 years ago and she is refused to have another one since.  She denies any rectal bleeding.  She gets her yearly mammograms done and they have been normal per her report.  Patient found to have bilateral pulmonary emboli again and referred for admission for such.  She is afebrile her vital signs are stable.  She is in start on heparin drip.    Hospital Course:   Bilateral pulmonary embolism -This is her second episode of unprovoked VTE, she finished a 1 year anticoagulation course in February. -I have not recommended lifelong anticoagulation, she wishes to resume Xarelto which is what she had been on in the past. -Case management has provided her with a 30-day card. -Hypercoagulable panel has been ordered with results pending at time of discharge and will need to be followed up. -She has been advised to stay up-to-date on her cancer screening, she is due for repeat colonoscopy. -As above, will send home with oxygen 2 L to use with activity, suspect she can be weaned off this over the course of the next 2 to 4 weeks.  Hypertension -Stable  Chronic kidney disease stage II -Creatinine stable   Procedures:  None  Consultations:  None  Discharge Instructions  Discharge Instructions  Diet - low sodium heart healthy   Complete by:  As directed    Increase activity slowly   Complete by:  As directed      Allergies as of 10/14/2017      Reactions   Hydrocodone Other (See Comments)   Altered mental status-Hallucination   Naproxen Other (See Comments)   Damage to kidney Kidney damage   Statins Other (See Comments)   Unknown reaction      Medication List    TAKE these medications   cetirizine 10 MG tablet Commonly known as:   ZYRTEC Take 10 mg by mouth every morning.   cyclobenzaprine 10 MG tablet Commonly known as:  FLEXERIL Take 10 mg by mouth daily as needed for muscle spasms.   furosemide 20 MG tablet Commonly known as:  LASIX Take 20 mg by mouth daily as needed for fluid.   lisinopril 40 MG tablet Commonly known as:  PRINIVIL,ZESTRIL Take 40 mg by mouth every evening.   lovastatin 10 MG tablet Commonly known as:  MEVACOR Take 10 mg by mouth at bedtime.   omeprazole 20 MG capsule Commonly known as:  PRILOSEC Take 20 mg by mouth every morning.   Rivaroxaban 15 & 20 MG Tbpk Take as directed on package: Start with one 15mg  tablet by mouth twice a day with food. On Day 22, switch to one 20mg  tablet once a day with food.   rivaroxaban 20 MG Tabs tablet Commonly known as:  XARELTO Take 1 tablet (20 mg total) by mouth daily with supper. Start using after you complete the starter pack   SALINE NASAL MIST NA Place 1-2 sprays into the nose daily as needed.   SOOTHE XP Soln Apply 1-2 drops to eye daily.   triamterene-hydrochlorothiazide 37.5-25 MG tablet Commonly known as:  MAXZIDE-25 Take 1 tablet by mouth every morning.      Allergies  Allergen Reactions  . Hydrocodone Other (See Comments)    Altered mental status-Hallucination  . Naproxen Other (See Comments)    Damage to kidney Kidney damage   . Statins Other (See Comments)    Unknown reaction   Follow-up Information    [facility]. Schedule an appointment as soon as possible for a visit in 2 week(s).   Specialty:  Internal Medicine Contact information: [place] [address] [phone]            The results of significant diagnostics from this hospitalization (including imaging, microbiology, ancillary and laboratory) are listed below for reference.    Significant Diagnostic Studies: Dg Chest 2 View  Result Date: 10/13/2017 CLINICAL DATA:  Cough and short of breath EXAM: CHEST - 2  VIEW COMPARISON:  None. FINDINGS: The heart size and mediastinal contours are within normal limits. Both lungs are clear. The visualized skeletal structures are unremarkable. IMPRESSION: No active cardiopulmonary disease. Electronically Signed   By: Franchot Gallo M.D.   On: 10/13/2017 16:05   Ct Angio Chest Pe W And/or Wo Contrast  Result Date: 10/13/2017 CLINICAL DATA:  Worsening shortness of breath. History of blood clots. EXAM: CT ANGIOGRAPHY CHEST WITH CONTRAST TECHNIQUE: Multidetector CT imaging of the chest was performed using the standard protocol during bolus administration of intravenous contrast. Multiplanar CT image reconstructions and MIPs were obtained to evaluate the vascular anatomy. CONTRAST:  51mL ISOVUE-370 IOPAMIDOL (ISOVUE-370) INJECTION 76% COMPARISON:  Chest x-ray from same day. FINDINGS: Cardiovascular: Lobar and segmental pulmonary emboli involving the right upper and lower lobes. Segmental pulmonary  emboli involving the left upper lobe, left lower lobe, and right middle lobe. No right heart strain. RV/LV ratio is 0.85. Borderline cardiomegaly. Trace pericardial effusion. Normal caliber thoracic aorta. Coronary, aortic arch, and branch vessel atherosclerotic vascular disease. Mediastinum/Nodes: No enlarged mediastinal, hilar, or axillary lymph nodes. Prior left hemithyroidectomy. The trachea and esophagus demonstrate no significant findings. Small hiatal hernia. Lungs/Pleura: Scattered geographic ground-glass density throughout both lungs could reflect atelectasis versus mosaic perfusion. No focal consolidation, pleural effusion, or pneumothorax. No suspicious pulmonary nodule. Upper Abdomen: No acute abnormality. Musculoskeletal: No chest wall abnormality. No acute or significant osseous findings. Review of the MIP images confirms the above findings. IMPRESSION: 1. Bilateral lobar and segmental pulmonary emboli with borderline right heart strain. RV/LV ratio is 0.85. 2. Borderline  cardiomegaly.  Trace pericardial effusion. 3.  Aortic atherosclerosis (ICD10-I70.0). Critical Value/emergent results were called by telephone at the time of interpretation on 10/13/2017 at 7:32 pm to Dr. Milton Ferguson , who verbally acknowledged these results. Electronically Signed   By: Titus Dubin M.D.   On: 10/13/2017 19:35    Microbiology: Recent Results (from the past 240 hour(s))  MRSA PCR Screening     Status: None   Collection Time: 10/13/17  9:30 PM  Result Value Ref Range Status   MRSA by PCR NEGATIVE NEGATIVE Final    Comment:        The GeneXpert MRSA Assay (FDA approved for NASAL specimens only), is one component of a comprehensive MRSA colonization surveillance program. It is not intended to diagnose MRSA infection nor to guide or monitor treatment for MRSA infections. Performed at [facility], [address], [place], [place] [address]      Labs: Basic Metabolic Panel: Recent Labs  Lab 10/13/17 1635 10/14/17 0416  NA 135 137  K 3.8 4.0  CL 97* 103  CO2 23 23  GLUCOSE 101* 108*  BUN 28* 23*  CREATININE 1.58* 1.34*  CALCIUM 9.7 9.3   Liver Function Tests: Recent Labs  Lab 10/13/17 1723  AST 39  ALT 31  ALKPHOS 99  BILITOT 0.8  PROT 7.6  ALBUMIN 4.2   No results for input(s): LIPASE, AMYLASE in the last 168 hours. No results for input(s): AMMONIA in the last 168 hours. CBC: Recent Labs  Lab 10/13/17 1635 10/14/17 0416  WBC 8.8 8.4  NEUTROABS 5.6  --   HGB 12.4 11.7*  HCT 38.4 36.2  MCV 91.4 91.6  PLT 252 241   Cardiac Enzymes: Recent Labs  Lab 10/13/17 1635 10/13/17 2042 10/14/17 0416 10/14/17 0812  TROPONINI <0.03 <0.03 <0.03 <0.03   BNP: BNP (last 3 results) No results for input(s): BNP in the last 8760 hours.  ProBNP (last 3 results) No results for input(s): PROBNP in the last 8760 hours.  CBG: No results for input(s): GLUCAP in the last 168 hours.     Signed:  Lelon Frohlich  [facility] Pager:  [phone] 10/14/2017, 11:42 AM

## 2017-10-14 NOTE — Progress Notes (Signed)
ANTICOAGULATION CONSULT NOTE - follow up [place] for Heparin Indication: pulmonary embolus  Allergies  Allergen Reactions  . Hydrocodone Other (See Comments)    Altered mental status-Hallucination  . Naproxen Other (See Comments)    Damage to kidney Kidney damage   . Statins Other (See Comments)    Unknown reaction    Patient Measurements: Height: 5\' 6"  (167.6 cm) Weight: 245 lb 2.4 oz (111.2 kg) IBW/kg (Calculated) : 59.3  HEPARIN DW (KG): 84.8  Vital Signs: Temp: 98.3 F (36.8 C) (05/04 0400) Temp Source: Axillary (05/04 0000) BP: 145/82 (05/04 0600) Pulse Rate: 84 (05/04 0600)  Labs: Recent Labs    10/13/17 1635 10/13/17 2042 10/14/17 0416  HGB 12.4  --  11.7*  HCT 38.4  --  36.2  PLT 252  --  241  LABPROT  --  14.0  --   INR  --  1.09  --   HEPARINUNFRC  --   --  0.77*  CREATININE 1.58*  --  1.34*  TROPONINI <0.03 <0.03 <0.03    Estimated Creatinine Clearance: 48.7 mL/min (A) (by C-G formula based on SCr of 1.34 mg/dL (H)).   Medical History: Past Medical History:  Diagnosis Date  . Arthritis   . Bronchitis   . H/O colonoscopy with polypectomy   . Hypercholesteremia   . Hypertension   . Renal disorder     Medications:  See med rec  Assessment: 72 y.o. 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 She presents to ED with over 2 weeks of worsening shortness of breath and dyspnea on exertion.  No pain or swelling in  her legs. Patient found to have bilateral pulmonary emboli again and referred for admission . Pharmacy asked to start heparin. HL is slightly above goal this AM  Goal of Therapy:  Heparin level 0.3-0.7 units/ml Monitor platelets by anticoagulation protocol: Yes   Plan:  Decrease heparin infusion to 1300 units/hr Check anti-Xa level in 6-8 hours and daily while on  heparin Continue to monitor H&H and platelets F/U plan for transition to po  Isac Sarna, BS [place], BCPS Clinical Pharmacist Pager [phone] 10/14/2017,7:51 AM

## 2017-10-14 NOTE — Progress Notes (Signed)
ANTICOAGULATION CONSULT NOTE - follow up [place] for Heparin--> xarelto Indication: pulmonary embolus  Allergies  Allergen Reactions  . Hydrocodone Other (See Comments)    Altered mental status-Hallucination  . Naproxen Other (See Comments)    Damage to kidney Kidney damage   . Statins Other (See Comments)    Unknown reaction    Patient Measurements: Height: 5\' 6"  (167.6 cm) Weight: 245 lb 2.4 oz (111.2 kg) IBW/kg (Calculated) : 59.3  HEPARIN DW (KG): 84.8  Vital Signs: Temp: 97.9 F (36.6 C) (05/04 0800) Temp Source: Oral (05/04 0800) BP: 145/68 (05/04 0800) Pulse Rate: 77 (05/04 0800)  Labs: Recent Labs    10/13/17 1635 10/13/17 2042 10/14/17 0416 10/14/17 0812  HGB 12.4  --  11.7*  --   HCT 38.4  --  36.2  --   PLT 252  --  241  --   LABPROT  --  14.0  --   --   INR  --  1.09  --   --   HEPARINUNFRC  --   --  0.77*  --   CREATININE 1.58*  --  1.34*  --   TROPONINI <0.03 <0.03 <0.03 <0.03    Estimated Creatinine Clearance: 48.7 mL/min (A) (by C-G formula based on SCr of 1.34 mg/dL (H)).   Medical History: Past Medical History:  Diagnosis Date  . Arthritis   . Bronchitis   . H/O colonoscopy with polypectomy   . Hypercholesteremia   . Hypertension   . Renal disorder     Medications:  See med rec  Assessment: 72 y.o. 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 She presents to ED with over 2 weeks of worsening shortness of breath and dyspnea on exertion.  No pain or swelling in  her legs. Patient found to have bilateral pulmonary emboli again and referred for admission . Pharmacy asked to transition to xarelto  Goal of Therapy:  Monitor platelets by anticoagulation protocol: Yes   Plan:  D/c heparin Xarelto 15mg  po BID with food for 21 days, then 20mg  daily Monitor for S/S of bleeding Educate  on [place], BS Pharm D, BCPS Clinical Pharmacist Pager [phone] 10/14/2017,10:57 AM

## 2017-10-14 NOTE — Progress Notes (Signed)
Faxed Xaralto 30 day card to nurses station at [phone]. Please call for any questions. [place] RN [employer] [phone]

## 2017-10-14 NOTE — Progress Notes (Signed)
Patient's O2 sat noted to be dropping mid-low 80's on RA. Dr. Jerilee Hoh made aware- patient still able to be discharged home with O2. Patient currently on 2 L [place] sat 96-98%.

## 2017-10-14 NOTE — Progress Notes (Signed)
Patient Saturations on Room Air at Rest = 88-96%   Patient Saturations on [employer] while Ambulating = 84-88%  Patient Saturations on 2 Liters of oxygen while Ambulating = 94%

## 2017-10-14 NOTE — Care Management Note (Signed)
Case Management Note  Patient Details  Name: Lauren Hamilton MRN: [id number] Date of Birth: 10-30-1945  Subjective/Objective:                 Oxygen referral made to [facility]. Portable tank will be delivered to room today by 5pm for patient to use in transport home. Jennye Moccasin card faxed to RN station, verified with nurse that it was received, and will go home with patient. No other CM needs.   Action/Plan:   Expected Discharge Date:  10/14/17               Expected Discharge Plan:  Home/Self Care  In-House Referral:     Discharge planning Services  CM Consult  Post Acute Care Choice:    Choice offered to:     DME Arranged:  Oxygen DME Agency:  [place]:    [facility] Agency:     Status of Service:  Completed, signed off  If discussed at [place] of Stay Meetings, dates discussed:    Additional Comments:  Carles Collet, RN 10/14/2017, 1:51 PM

## 2017-10-16 LAB — HOMOCYSTEINE: Homocysteine: 16.3 umol/L — ABNORMAL HIGH (ref 0.0–15.0)

## 2017-10-17 LAB — PROTEIN S ACTIVITY: Protein S Activity: 115 % (ref 63–140)

## 2017-10-17 LAB — PROTEIN S, TOTAL: Protein S Ag, Total: 123 % (ref 60–150)

## 2017-10-17 LAB — DRVVT CONFIRM: dRVVT Confirm: 1.4 ratio — ABNORMAL HIGH (ref 0.8–1.2)

## 2017-10-17 LAB — LUPUS ANTICOAGULANT PANEL
DRVVT: 76.7 s — ABNORMAL HIGH (ref 0.0–47.0)
PTT Lupus Anticoagulant: 44.8 s (ref 0.0–51.9)

## 2017-10-17 LAB — BETA-2-GLYCOPROTEIN I ABS, IGG/M/A
Beta-2 Glyco I IgG: <9 GPI IgG units (ref 0–20)
Beta-2-Glycoprotein I IgA: <9 GPI IgA units (ref 0–25)
Beta-2-Glycoprotein I IgM: <9 GPI IgM units (ref 0–32)

## 2017-10-17 LAB — CARDIOLIPIN ANTIBODIES, IGG, IGM, IGA
Anticardiolipin IgA: <9 APL U/mL (ref 0–11)
Anticardiolipin IgG: <9 GPL U/mL (ref 0–14)
Anticardiolipin IgM: 16 MPL U/mL — ABNORMAL HIGH (ref 0–12)

## 2017-10-17 LAB — DRVVT MIX: dRVVT Mix: 58.9 s — ABNORMAL HIGH (ref 0.0–47.0)

## 2017-10-17 LAB — PROTEIN C ACTIVITY: Protein C Activity: 140 % (ref 73–180)

## 2017-10-18 LAB — PROTEIN C, TOTAL: Protein C, Total: 117 % (ref 60–150)

## 2017-10-18 LAB — PROTHROMBIN GENE MUTATION

## 2017-10-20 LAB — FACTOR 5 LEIDEN

## 2017-10-27 DIAGNOSIS — I129 Hypertensive chronic kidney disease with stage 1 through stage 4 chronic kidney disease, or unspecified chronic kidney disease: Secondary | ICD-10-CM | POA: Diagnosis not present

## 2017-10-27 DIAGNOSIS — I2699 Other pulmonary embolism without acute cor pulmonale: Secondary | ICD-10-CM | POA: Diagnosis not present

## 2017-10-27 DIAGNOSIS — N184 Chronic kidney disease, stage 4 (severe): Secondary | ICD-10-CM | POA: Diagnosis not present

## 2017-11-08 DIAGNOSIS — Z1212 Encounter for screening for malignant neoplasm of rectum: Secondary | ICD-10-CM | POA: Diagnosis not present

## 2017-11-08 DIAGNOSIS — Z1211 Encounter for screening for malignant neoplasm of colon: Secondary | ICD-10-CM | POA: Diagnosis not present

## 2017-11-13 DIAGNOSIS — R05 Cough: Secondary | ICD-10-CM | POA: Diagnosis not present

## 2017-11-13 DIAGNOSIS — Z6838 Body mass index (BMI) 38.0-38.9, adult: Secondary | ICD-10-CM | POA: Diagnosis not present

## 2017-11-13 DIAGNOSIS — K219 Gastro-esophageal reflux disease without esophagitis: Secondary | ICD-10-CM | POA: Diagnosis not present

## 2017-11-13 DIAGNOSIS — R1314 Dysphagia, pharyngoesophageal phase: Secondary | ICD-10-CM | POA: Diagnosis not present

## 2017-11-13 DIAGNOSIS — I129 Hypertensive chronic kidney disease with stage 1 through stage 4 chronic kidney disease, or unspecified chronic kidney disease: Secondary | ICD-10-CM | POA: Diagnosis not present

## 2017-11-13 DIAGNOSIS — N184 Chronic kidney disease, stage 4 (severe): Secondary | ICD-10-CM | POA: Diagnosis not present

## 2018-01-05 ENCOUNTER — Encounter: Payer: Self-pay | Admitting: Gastroenterology

## 2018-01-11 ENCOUNTER — Encounter: Payer: Self-pay | Admitting: Allergy & Immunology

## 2018-01-11 ENCOUNTER — Ambulatory Visit (INDEPENDENT_AMBULATORY_CARE_PROVIDER_SITE_OTHER): Payer: Medicare Other | Admitting: Allergy & Immunology

## 2018-01-11 VITALS — BP 118/62 | HR 94 | Temp 98.1°F | Ht 65.5 in | Wt 225.2 lb

## 2018-01-11 DIAGNOSIS — T781XXD Other adverse food reactions, not elsewhere classified, subsequent encounter: Secondary | ICD-10-CM

## 2018-01-11 DIAGNOSIS — R05 Cough: Secondary | ICD-10-CM

## 2018-01-11 DIAGNOSIS — R059 Cough, unspecified: Secondary | ICD-10-CM

## 2018-01-11 DIAGNOSIS — J310 Chronic rhinitis: Secondary | ICD-10-CM

## 2018-01-11 NOTE — Patient Instructions (Addendum)
1. Cough - Spirometry looked good today, but it did improve slightly with the use of albuterol.  - We will give you a sample of Symbicort to see if this helps with your cough.  - Spacer sample and demonstration provided. - Daily controller medication(s): Symbicort 160/4.13mcg two puffs twice daily with spacer - Prior to physical activity: ProAir 2 puffs 10-15 minutes before physical activity. - Rescue medications: ProAir 4 puffs every 4-6 hours as needed - Asthma control goals:  * Full participation in all desired activities (may need albuterol before activity) * Albuterol use two time or less a week on average (not counting use with activity) * Cough interfering with sleep two time or less a month * Oral steroids no more than once a year * No hospitalizations   2. Chronic rhinitis - We will get the shots overnighted to [place] and you can get your next shot on Tuesday in [place]. - Continue with nasal saline rinses 1-2 times daily as needed.  - Continue with montelukast 10mg  daily.  - EpiPen is up to date.   3. Adverse food reaction (corn) - I am not convinced that the corn is related to your symptoms at all. - Since there is no improvement with the cough with avoidance of corn, I would just reintroduce corn back into your diet.  4. Return in about 4 days (around 01/15/2018) for alergy shot ([place]).   Please inform us of any Emergency Department visits, hospitalizations, or changes in symptoms. Call us before going to the ED for breathing or allergy symptoms since we might be able to fit you in for a sick visit. Feel free to contact us anytime with any questions, problems, or concerns.  It was a pleasure to meet you today!  Websites that have reliable patient information: 1. American Academy of Asthma, Allergy, and Immunology: www.aaaai.org 2. Food Allergy Research and Education (FARE): foodallergy.org 3. Mothers of Asthmatics: http://www.asthmacommunitynetwork.org 4. American  College of Allergy, Asthma, and Immunology: MonthlyElectricBill.co.uk   Make sure you are registered to vote! If you have moved or changed any of your contact information, you will need to get this updated before voting!

## 2018-01-11 NOTE — Progress Notes (Signed)
NEW PATIENT  Date of Service/Encounter:  01/11/18  Referring provider: Gildardo Cranker, MD   Assessment:   Chronic rhinitis  Cough  Adverse food reaction  Plan/Recommendations:   1. Cough - Spirometry looked good today, but it did improve slightly with the use of albuterol.  - We will give you a sample of Symbicort to see if this helps with your cough.  - Spacer sample and demonstration provided. - Daily controller medication(s): Symbicort 160/4.83mcg two puffs twice daily with spacer - Prior to physical activity: ProAir 2 puffs 10-15 minutes before physical activity. - Rescue medications: ProAir 4 puffs every 4-6 hours as needed - Asthma control goals:  * Full participation in all desired activities (may need albuterol before activity) * Albuterol use two time or less a week on average (not counting use with activity) * Cough interfering with sleep two time or less a month * Oral steroids no more than once a year * No hospitalizations   2. Chronic rhinitis - We will get the shots overnighted to [place] and you can get your next shot on Tuesday in [place]. - Continue with nasal saline rinses 1-2 times daily as needed.  - Continue with montelukast 10mg  daily.  - EpiPen is up to date.   3. Adverse food reaction (corn) - I am not convinced that the corn is related to your symptoms at all. - Since there is no improvement with the cough with avoidance of corn, I would just reintroduce corn back into your diet. - I will review her other results once I get them from her previous provider.   4. Return in about 4 days (around 01/15/2018) for alergy shot ([place]).  Subjective:   Lauren Hamilton is a 72 y.o. female presenting today for evaluation of  Chief Complaint  Patient presents with  . Injections    Trying to relocate with injections to [employer] has a history of the following: Patient Active Problem List   Diagnosis Date Noted  . Chronic  rhinitis 01/12/2018  . Pulmonary embolism ([place]) 10/13/2017  . Hypertension   . Arthritis   . Renal disorder   . H/O colonoscopy with polypectomy     History obtained from: chart review and patient.  Lauren Hamilton was referred by Gildardo Cranker, MD. She sees [facility] in [place], [place]. She has moved to [place] from here, and now lives in [place]. She would like to get her shots in [place].    Lauren Hamilton is a 72 y.o. female presenting to establish care. She is on allergy shots and has only received her first three injections. She is moving from [place] to [place].   Asthma/Respiratory Symptom History: She does report a chronic cough for ages. She has had this constantly for around 7 months. She "took bronchitis". She has never been treated for prednisone for her cough. She has never had a breathing test to evaluate for asthma. She does have GERD and is on omeprazole on a daily basis. She can tell when she does not take it since her symptoms do acutely worsen.   Allergic Rhinitis Symptom History: She has "sinuses" with headaches and drainage. She does not do fluticasone. But she will do nasal saline rinses as well. She is on montelukast, but she is not on an antihistamine.  She is unsure what was positive on the testing at her outside provider, but she had vials made and has only received three injections. She said that  her previous provider would sent notes AFTER she established care, so we will get those after her visit today. She did have skin testing performed, but again she does not remember what was positive.   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 She has avoided all of this for 2.5 months without any improvement. It seems that she was under the impression that this might help with her coughing. In any case, there have been no changes. She does not think that anything else was was positive with regards  to food. She has never had a classical IgE mediated reaction to any foods at all.   Otherwise, there is no history of other atopic diseases, including stinging insect allergies or urticaria. There is no significant infectious history. Vaccinations are up to date.    Past Medical History: Patient Active Problem List   Diagnosis Date Noted  . Chronic rhinitis 01/12/2018  . Pulmonary embolism ([place]) 10/13/2017  . Hypertension   . Arthritis   . Renal disorder   . H/O colonoscopy with polypectomy     Medication List:  Allergies as of 01/11/2018      Reactions   Hydrocodone Other (See Comments)   Altered mental status-Hallucination   Naproxen Other (See Comments)   Damage to kidney Kidney damage   Statins Other (See Comments)   Unknown reaction      Medication List        Accurate as of 01/11/18 11:59 PM. Always use your most recent med list.          albuterol 108 (90 Base) MCG/ACT inhaler Commonly known as:  PROAIR HFA Inhale 2 puffs into the lungs every 4 (four) hours as needed for wheezing or shortness of breath.   budesonide-formoterol 160-4.5 MCG/ACT inhaler Commonly known as:  SYMBICORT Inhale 2 puffs into the lungs 2 (two) times daily.   EPINEPHrine 0.3 mg/0.3 mL Soaj injection Commonly known as:  EPI-PEN See admin instructions.   furosemide 20 MG tablet Commonly known as:  LASIX Take 20 mg by mouth daily as needed for fluid.   lovastatin 10 MG tablet Commonly known as:  MEVACOR Take 10 mg by mouth at bedtime.   omeprazole 40 MG capsule Commonly known as:  PRILOSEC Take 40 mg by mouth daily.   rivaroxaban 20 MG Tabs tablet Commonly known as:  XARELTO Take 1 tablet (20 mg total) by mouth daily with supper. Start using after you complete the starter pack   SOOTHE XP Soln Apply 1-2 drops to eye daily.   triamterene-hydrochlorothiazide 37.5-25 MG tablet Commonly known as:  MAXZIDE-25 Take 1 tablet by mouth every morning.       Birth History:  non-contributory.   Developmental History: non-contributory.   Past Surgical History: Past Surgical History:  Procedure Laterality Date  . carpel tunnel    . CHOLECYSTECTOMY    . KNEE ARTHROSCOPY    . TONSILLECTOMY       Family History: Family History  Problem Relation Age of Onset  . Allergic rhinitis Sister   . Allergic rhinitis Brother      Social History: Margarett lives at home by herself. She moved to [place] recently to be closer to her daughter, who has 8 children and homeschools all of them. She clearly loves her grandchildren and she tells me that they "keep her young".  She has no animals in her room.  There are hardwoods throughout her home.  There is no  smoking exposure.  She stays busy in a knitting club as well as another group that does a lot of volunteer work for [employer].  She is also clearly busy with her grandchildren.    Review of Systems: a 14-point review of systems is pertinent for what is mentioned in HPI.  Otherwise, all other systems were negative. Constitutional: negative other than that listed in the HPI Eyes: negative other than that listed in the HPI Ears, nose, mouth, throat, and face: negative other than that listed in the HPI Respiratory: negative other than that listed in the HPI Cardiovascular: negative other than that listed in the HPI Gastrointestinal: negative other than that listed in the HPI Genitourinary: negative other than that listed in the HPI Integument: negative other than that listed in the HPI Hematologic: negative other than that listed in the HPI Musculoskeletal: negative other than that listed in the HPI Neurological: negative other than that listed in the HPI Allergy/Immunologic: negative other than that listed in the HPI    Objective:   Blood pressure 118/62, pulse 94, temperature 98.1 F (36.7 C), temperature source Oral, height 5' 5.5" (1.664 m), weight 225 lb 3.2 oz (102.2 kg), SpO2 98 %. Body mass index  is 36.91 kg/m.   Physical Exam:  General: Alert, interactive, in no acute distress. Smiling. Weaving a hat for a preemie female on a loom when I walk in. Eyes: No conjunctival injection bilaterally, no discharge on the right, no discharge on the left and no Horner-Trantas dots present. PERRL bilaterally. EOMI without pain. No photophobia.  Ears: Right TM pearly gray with normal light reflex, Left TM pearly gray with normal light reflex, Right TM intact without perforation and Left TM intact without perforation.  Nose/Throat: External nose within normal limits and septum midline. Turbinates edematous with clear discharge. Posterior oropharynx moderately erythematous with cobblestoning in the posterior oropharynx. Tonsils 2+ without exudates.  Tongue without thrush. Neck: Supple without thyromegaly. Trachea midline. Adenopathy: no enlarged lymph nodes appreciated in the anterior cervical, occipital, axillary, epitrochlear, inguinal, or popliteal regions. Lungs: Clear to auscultation without wheezing, rhonchi or rales. No increased work of breathing. CV: Normal S1/S2. No murmurs. Capillary refill <2 seconds.  Abdomen: Nondistended, nontender. No guarding or rebound tenderness. Bowel sounds present in all fields and hyperactive  Skin: Warm and dry, without lesions or rashes. Extremities:  No clubbing, cyanosis or edema. Neuro:   Grossly intact. No focal deficits appreciated. Responsive to questions.  Diagnostic studies:   Spirometry: results normal (FEV1: 2.49/107%, FVC: 3.09/100%, FEV1/FVC: 81%).    Spirometry consistent with normal pattern.   Allergy Studies: none     Salvatore Marvel, MD Allergy and [place] [facility] [place]

## 2018-01-12 ENCOUNTER — Encounter: Payer: Self-pay | Admitting: Allergy & Immunology

## 2018-01-12 DIAGNOSIS — J310 Chronic rhinitis: Secondary | ICD-10-CM | POA: Insufficient documentation

## 2018-01-12 MED ORDER — BUDESONIDE-FORMOTEROL FUMARATE 160-4.5 MCG/ACT IN AERO: 2.0000 | INHALATION_SPRAY | Freq: Two times a day (BID) | RESPIRATORY_TRACT | 5 refills | Status: DC

## 2018-01-12 MED ORDER — ALBUTEROL SULFATE HFA 108 (90 BASE) MCG/ACT IN AERS: 2.0000 | INHALATION_SPRAY | RESPIRATORY_TRACT | 3 refills | Status: DC | PRN

## 2018-01-12 NOTE — Progress Notes (Signed)
I spoke with [facility] again. Advised by Anderson Malta that the allergy person was gone for the day. I then spoke with T. Glass the office manager. She advised that I would not be getting the vials today because they were not able to access them. She told me that they have an outside source who handles that for them. I did let her know that was poor continuity of care for our patient. She told me that she ws sorry and that our office has policies and so does theirs. I then spoke with my Engineer, building services and she told me to tell the other office that we would just contact the patient and let them know what was going on.   I spoke with patient and advised of above. Patient advised that she had requested the vials yesterday to be mailed overnight. They were not received today and I did tell the patient that. She was very unhappy and told me that she would be call that office back and someone would be hearing something. I let her know that the only options we have are to let her pick them up on Monday and bring them with her or to just remake the entire set.

## 2018-01-12 NOTE — Progress Notes (Signed)
I spoke with Anderson Malta at 108 pm. She advised me that Lauren Hamilton had left for the day and unfortunately they were not able to access the vials. I told her that I would like a call back from the MD or NP on site because this was important. I let her know that the patient was expecting her injections to be here Monday and the MD I worked for was expecting the same. She took my name and number, again, then advised someone would be in touch today.

## 2018-01-12 NOTE — Progress Notes (Signed)
I have reached out to [facility]. They do the immunotherapy onsite. I am waiting on a call from Roselyn Reef, the allergy coordinator, to call back.

## 2018-01-16 ENCOUNTER — Ambulatory Visit (INDEPENDENT_AMBULATORY_CARE_PROVIDER_SITE_OTHER): Payer: Medicare Other | Admitting: *Deleted

## 2018-01-16 DIAGNOSIS — H40053 Ocular hypertension, bilateral: Secondary | ICD-10-CM | POA: Diagnosis not present

## 2018-01-16 DIAGNOSIS — J309 Allergic rhinitis, unspecified: Secondary | ICD-10-CM | POA: Diagnosis not present

## 2018-01-17 NOTE — Addendum Note (Signed)
Addended by: Isabel Caprice on: 01/17/2018 05:28 PM   Modules accepted: Orders

## 2018-01-17 NOTE — Progress Notes (Signed)
I spoke to [facility] about her vials, most of which looked like they were straight diluent rather than allergen. The directions are completely unclear and we are not certain what is in the vials themselves. Therefore we are going to get blood testing and then reorder the vials ourselves.  Salvatore Marvel, MD Allergy and [place] [facility] [place]

## 2018-01-17 NOTE — Addendum Note (Signed)
Addended by: Valentina Shaggy on: 01/17/2018 04:42 PM   Modules accepted: Orders

## 2018-01-18 ENCOUNTER — Telehealth: Payer: Self-pay | Admitting: *Deleted

## 2018-01-18 NOTE — Progress Notes (Signed)
Immunotherapy   Patient Details  Name: Lauren Hamilton MRN: [id number] Date of Birth: [birth year]  01/16/18   Patient transferred vials and started injections in office.  Vials are from [facility].  [employer].  Silver Vial 1:10,000 and 0.30 ml of each vial was given per their schedule and protocol.  Patient will come once weekly for injections.   Patient does have current EpiPen and has been instructed on proper use. Consent signed and patient instructions given.  Patient waited 30 minutes after injection and no issues.   Maree Erie 01/18/2018, 11:14 AM

## 2018-01-18 NOTE — Telephone Encounter (Signed)
Called and spoke with patient regarding lab work requested by Dr. Ernst Bowler.  Patient will come to [facility] office on Tuesday to pick up lab requisition and get lab work done.  Discussed with patient getting vials made by our office depending on results of blood work.  Patient voiced understanding.

## 2018-01-19 NOTE — Progress Notes (Signed)
Dr Ernst Bowler asked me if we could charge for new vials since the other office had just recently made some.  I explained that we can only charge for 5 units, so I thought it would be ok.  However, I did tell him there is a chance we will not get paid for the vials but there should be no problem getting paid for her injections.

## 2018-01-23 ENCOUNTER — Other Ambulatory Visit (HOSPITAL_COMMUNITY)
Admission: RE | Admit: 2018-01-23 | Discharge: 2018-01-23 | Disposition: A | Discharge status: Home / Self Care | Payer: Medicare Other | Source: Ambulatory Visit | Attending: Allergy & Immunology | Admitting: Allergy & Immunology

## 2018-01-23 DIAGNOSIS — J310 Chronic rhinitis: Secondary | ICD-10-CM | POA: Insufficient documentation

## 2018-01-25 DIAGNOSIS — Z9989 Dependence on other enabling machines and devices: Secondary | ICD-10-CM | POA: Diagnosis not present

## 2018-01-25 DIAGNOSIS — G4733 Obstructive sleep apnea (adult) (pediatric): Secondary | ICD-10-CM | POA: Diagnosis not present

## 2018-01-25 LAB — ALLERGENS W/TOTAL IGE AREA 2

## 2018-01-31 NOTE — Progress Notes (Signed)
We received records from [facility], where her allergy testing was performed. She had an entire food panel for unknown reason that showed positives to shrimp, sesame, buckwheat, fish mix, and vanilla. She had environmental allergy testing that demonstrated positives to white ash, bahia grass, rye grass, sorrel, cocklebur, lamb's quarters, rough marshelder, pigweed, ragweed, russian thistle, mugwort, Alternaria, Aspergillus, and Mucor.    She is scheduled for repeat testing in our clinic next Tuesday 02/06/18.   Salvatore Marvel, MD Allergy and [place] [facility] [place]

## 2018-02-06 ENCOUNTER — Encounter: Payer: Self-pay | Admitting: Allergy & Immunology

## 2018-02-06 ENCOUNTER — Ambulatory Visit (INDEPENDENT_AMBULATORY_CARE_PROVIDER_SITE_OTHER): Payer: Medicare Other | Admitting: Allergy & Immunology

## 2018-02-06 VITALS — BP 116/64 | HR 90 | Resp 17

## 2018-02-06 DIAGNOSIS — J302 Other seasonal allergic rhinitis: Secondary | ICD-10-CM

## 2018-02-06 DIAGNOSIS — J3089 Other allergic rhinitis: Secondary | ICD-10-CM

## 2018-02-06 DIAGNOSIS — J4540 Moderate persistent asthma, uncomplicated: Secondary | ICD-10-CM | POA: Diagnosis not present

## 2018-02-06 NOTE — Patient Instructions (Addendum)
1. Cough - Spirometry looked good today.  - Your cough seems to have improved with the inhaler, so I think we are going to keep you on that.  - Start Breo 200/25 one puff once daily (CALL us if this is too expensive!)  - Daily controller medication(s): Breo 200/8mcg one puff once daily - Prior to physical activity: ProAir 2 puffs 10-15 minutes before physical activity. - Rescue medications: ProAir 4 puffs every 4-6 hours as needed - Asthma control goals:  * Full participation in all desired activities (may need albuterol before activity) * Albuterol use two time or less a week on average (not counting use with activity) * Cough interfering with sleep two time or less a month * Oral steroids no more than once a year * No hospitalizations   2. Chronic rhinitis - Testing today showed: indoor molds and outdoor molds - Avoidance measures provided. - Continue with: nasal saline rinses 1-2 times daily.  - Start taking: Singulair (montelukast) 10mg  daily - You can use an extra dose of the antihistamine, if needed, for breakthrough symptoms.  - Consider nasal saline rinses 1-2 times daily to remove allergens from the nasal cavities as well as help with mucous clearance (this is especially helpful to do before the nasal sprays are given) - Consider allergy shots as a means of long-term control if this is not working.   3. Return in about 3 months (around 05/09/2018).   Please inform us of any Emergency Department visits, hospitalizations, or changes in symptoms. Call us before going to the ED for breathing or allergy symptoms since we might be able to fit you in for a sick visit. Feel free to contact us anytime with any questions, problems, or concerns.  It was a pleasure to see you again today!  Websites that have reliable patient information: 1. American Academy of Asthma, Allergy, and Immunology: www.aaaai.org 2. Food Allergy Research and Education (FARE): foodallergy.org 3. Mothers of  Asthmatics: http://www.asthmacommunitynetwork.org 4. American College of Allergy, Asthma, and Immunology: MonthlyElectricBill.co.uk   Make sure you are registered to vote! If you have moved or changed any of your contact information, you will need to get this updated before voting!       Control of Mold Allergen   Mold and fungi can grow on a variety of surfaces provided certain temperature and moisture conditions exist.  Outdoor molds grow on plants, decaying vegetation and soil.  The major outdoor mold, Alternaria and Cladosporium, are found in very high numbers during hot and dry conditions.  Generally, a late Summer - Fall peak is seen for common outdoor fungal spores.  Rain will temporarily lower outdoor mold spore count, but counts rise rapidly when the rainy period ends.  The most important indoor molds are Aspergillus and Penicillium.  Dark, humid and poorly ventilated basements are ideal sites for mold growth.  The next most common sites of mold growth are the bathroom and the kitchen.  Outdoor (Seasonal) Mold Control  Positive outdoor molds via skin testing: Alternaria and Cladosporium  1. Use air conditioning and keep windows closed 2. Avoid exposure to decaying vegetation. 3. Avoid leaf raking. 4. Avoid grain handling. 5. Consider wearing a face mask if working in moldy areas.  6.   Indoor (Perennial) Mold Control   Positive indoor molds via skin testing: Aspergillus, Penicillium, Fusarium, Aureobasidium (Pullulara) and Rhizopus  1. Maintain humidity below 50%. 2. Clean washable surfaces with 5% bleach solution. 3. Remove sources e.g. contaminated carpets.  Allergy Shots   Allergies are the result of a chain reaction that starts in the immune system. Your immune system controls how your body defends itself. For instance, if you have an allergy to pollen, your immune system identifies pollen as an invader or allergen. Your immune system overreacts by producing antibodies called  Immunoglobulin E (IgE). These antibodies travel to cells that release chemicals, causing an allergic reaction.  The concept behind allergy immunotherapy, whether it is received in the form of shots or tablets, is that the immune system can be desensitized to specific allergens that trigger allergy symptoms. Although it requires time and patience, the payback can be long-term relief.  How Do Allergy Shots Work?  Allergy shots work much like a vaccine. Your body responds to injected amounts of a particular allergen given in increasing doses, eventually developing a resistance and tolerance to it. Allergy shots can lead to decreased, minimal or no allergy symptoms.  There generally are two phases: build-up and maintenance. Build-up often ranges from three to six months and involves receiving injections with increasing amounts of the allergens. The shots are typically given once or twice a week, though more rapid build-up schedules are sometimes used.  The maintenance phase begins when the most effective dose is reached. This dose is different for each person, depending on how allergic you are and your response to the build-up injections. Once the maintenance dose is reached, there are longer periods between injections, typically two to four weeks.  Occasionally doctors give cortisone-type shots that can temporarily reduce allergy symptoms. These types of shots are different and should not be confused with allergy immunotherapy shots.  Who Can Be Treated with Allergy Shots?  Allergy shots may be a good treatment approach for people with allergic rhinitis (hay fever), allergic asthma, conjunctivitis (eye allergy) or stinging insect allergy.   Before deciding to begin allergy shots, you should consider:  . The length of allergy season and the severity of your symptoms . Whether medications and/or changes to your environment can control your symptoms . Your desire to avoid long-term medication use .  Time: allergy immunotherapy requires a major time commitment . Cost: may vary depending on your insurance coverage  Allergy shots for children age 30 and older are effective and often well tolerated. They might prevent the onset of new allergen sensitivities or the progression to asthma.  Allergy shots are not started on patients who are pregnant but can be continued on patients who become pregnant while receiving them. In some patients with other medical conditions or who take certain common medications, allergy shots may be of risk. It is important to mention other medications you talk to your allergist.   When Will I Feel Better?  Some may experience decreased allergy symptoms during the build-up phase. For others, it may take as long as 12 months on the maintenance dose. If there is no improvement after a year of maintenance, your allergist will discuss other treatment options with you.  If you aren't responding to allergy shots, it may be because there is not enough dose of the allergen in your vaccine or there are missing allergens that were not identified during your allergy testing. Other reasons could be that there are high levels of the allergen in your environment or major exposure to non-allergic triggers like tobacco smoke.  What Is the Length of Treatment?  Once the maintenance dose is reached, allergy shots are generally continued for three to five years. The decision to stop  should be discussed with your allergist at that time. Some people may experience a permanent reduction of allergy symptoms. Others may relapse and a longer course of allergy shots can be considered.  What Are the Possible Reactions?  The two types of adverse reactions that can occur with allergy shots are local and systemic. Common local reactions include very mild redness and swelling at the injection site, which can happen immediately or several hours after. A systemic reaction, which is less common,  affects the entire body or a particular body system. They are usually mild and typically respond quickly to medications. Signs include increased allergy symptoms such as sneezing, a stuffy nose or hives.  Rarely, a serious systemic reaction called anaphylaxis can develop. Symptoms include swelling in the throat, wheezing, a feeling of tightness in the chest, nausea or dizziness. Most serious systemic reactions develop within 30 minutes of allergy shots. This is why it is strongly recommended you wait in your doctor's office for 30 minutes after your injections. Your allergist is trained to watch for reactions, and his or her staff is trained and equipped with the proper medications to identify and treat them.  Who Should Administer Allergy Shots?  The preferred location for receiving shots is your prescribing allergist's office. Injections can sometimes be given at another facility where the physician and staff are trained to recognize and treat reactions, and have received instructions by your prescribing allergist.

## 2018-02-06 NOTE — Progress Notes (Signed)
FOLLOW UP  Date of Service/Encounter:  02/06/18   Assessment:   Seasonal and perennial allergic rhinitis (indoor/outdoor molds)  Moderate persistent asthma, uncomplicated   Ms. Robben has mildly reactive intradermal testing to indoor and outdoor molds, otherwise her testing is completely negative. It is difficult to understand how the testing could be completely different at a previous evaluation. Testing can vary based on the prick devices used and interpretation can vary slightly, but I do not think that this would make the testing so different. We did provide a copy of our testing to Ms. Uphoff so that she can use it as she would like. We are doing to continue with nasal saline rinses for now. Unfortunately, she does not tolerate antihistamines or nasal steroids/antihistamines due to over drying. She also has a history of glaucoma and is hesitant to use any nasal steroids. Therefore, we can consider allergen immunotherapy if needed at that point.   Plan/Recommendations:   1. Cough - Spirometry looked good today.  - Your cough seems to have improved with the inhaler, so I think we are going to keep you on that.  - Start Breo 200/25 one puff once daily (CALL us if this is too expensive!)  - Daily controller medication(s): Breo 200/37mcg one puff once daily - Prior to physical activity: ProAir 2 puffs 10-15 minutes before physical activity. - Rescue medications: ProAir 4 puffs every 4-6 hours as needed - Asthma control goals:  * Full participation in all desired activities (may need albuterol before activity) * Albuterol use two time or less a week on average (not counting use with activity) * Cough interfering with sleep two time or less a month * Oral steroids no more than once a year * No hospitalizations   2. Chronic rhinitis - Testing today showed: indoor molds and outdoor molds - Avoidance measures provided. - Continue with: nasal saline rinses 1-2 times daily.  - Start  taking: Singulair (montelukast) 10mg  daily - You can use an extra dose of the antihistamine, if needed, for breakthrough symptoms.  - Consider nasal saline rinses 1-2 times daily to remove allergens from the nasal cavities as well as help with mucous clearance (this is especially helpful to do before the nasal sprays are given) - Consider allergy shots as a means of long-term control if this is not working.   3. Return in about 3 months (around 05/09/2018).  Subjective:   Illyanna Petillo is a 72 y.o. female presenting today for follow up of  Chief Complaint  Patient presents with  . Allergy Testing    Kae Lauman has a history of the following: Patient Active Problem List   Diagnosis Date Noted  . Moderate persistent asthma, uncomplicated 16/03/9603  . Chronic rhinitis 01/12/2018  . Pulmonary embolism ([place]) 10/13/2017  . Hypertension   . Arthritis   . Renal disorder   . H/O colonoscopy with polypectomy     History obtained from: chart review and patient.  Angelita Ingles Kurowski's Primary Care Provider is Gildardo Cranker, MD.     Alona is a 72 y.o. female presenting for a follow up visit. She was last seen in early August for her first visit with [place]. At the time, she endorsed a chronic cough. A spirometry was normal, but there was some improvement with the albuterol nebulizer treatment. We provided a sample of Symbicort to see if this would help with the chronic cough. She had never needed inhalers in the past and had never been diagnosed with asthma.  She also came with a diagnosis of allergic rhinitis. She had allergy shots that had been ordered by her previous physician in [place], [place]. It was unclear at the visit what her testing had shown, so we requested the records and obtained an environmental sIgE panel. The blood work was completely negative.  In the interim, we received her vials (which were made at [place]). One of them was completely clear despite being the Red  Vial. We were confused and felt that there was no allergen in the vials. Therefore we recommended that she get retested before we order new vials. We also obtained the records from her previous physician that demonstrated positives to white ash, bahia grass, rye grass, sorrel, cocklebur, lamb's quarters, rough marshelder, pigweed, ragweed, russian thistle, mugwort, Alternaria, Aspergillus, and Mucor. She also had food testing performed for unknown reasons that showed sensitizations to shrimp, sesame, buckwheat, fish mix, and vanilla.   Since the last visit, she has done well. The addition of the Symbicort has helped to clear up her cough. However, the cost of the Symbicort was $120 so she did not fill the prescription (instead she has been using the samples we provided). ACT today is 20, indicating excellent asthma control. She has not been on prednisone since the last visit.  Rhinitis has continued to be a problem. She has been off of antihistamines for one week for the testing today. She is very interested in seeing what our testing today shows. She tells me that her daughter works for an [employer] and is investigating [employer] [facility]. Evidently there were some charges on days that Ms. Start was not in the clinic, which peaked Ms. Blakeman's interest.   Otherwise, there have been no changes to her past medical history, surgical history, family history, or social history.    Review of Systems: a 14-point review of systems is pertinent for what is mentioned in HPI.  Otherwise, all other systems were negative. Constitutional: negative other than that listed in the HPI Eyes: negative other than that listed in the HPI Ears, nose, mouth, throat, and face: negative other than that listed in the HPI Respiratory: negative other than that listed in the HPI Cardiovascular: negative other than that listed in the HPI Gastrointestinal: negative other than that listed in the HPI Genitourinary:  negative other than that listed in the HPI Integument: negative other than that listed in the HPI Hematologic: negative other than that listed in the HPI Musculoskeletal: negative other than that listed in the HPI Neurological: negative other than that listed in the HPI Allergy/Immunologic: negative other than that listed in the HPI    Objective:   Blood pressure 116/64, pulse 90, resp. rate 17, SpO2 97 %. There is no height or weight on file to calculate BMI.   Physical Exam:  General: Alert, interactive, in no acute distress. Eyes: No conjunctival injection bilaterally, no discharge on the right, no discharge on the left and no Horner-Trantas dots present. PERRL bilaterally. EOMI without pain. No photophobia.  Ears: Right TM pearly gray with normal light reflex, Left TM pearly gray with normal light reflex, Right TM intact without perforation and Left TM intact without perforation.  Nose/Throat: External nose within normal limits and septum midline. Turbinates edematous without discharge. Posterior oropharynx mildly erythematous without cobblestoning in the posterior oropharynx. Tonsils 2+ without exudates.  Tongue without thrush. Lungs: Clear to auscultation without wheezing, rhonchi or rales. No increased work of breathing. CV: Normal S1/S2. No murmurs. Capillary refill <  2 seconds.  Skin: Warm and dry, without lesions or rashes. Neuro:   Grossly intact. No focal deficits appreciated. Responsive to questions.  Diagnostic studies:   Allergy Studies:   Indoor/Outdoor Percutaneous Adult Environmental Panel: negative with adequate controls.  Indoor/Outdoor Selected Intradermal Environmental Panel: positive to mold mix #1, mold mix #2 and mold mix #4. Otherwise negative with adequate controls.  Allergy testing results were read and interpreted by myself, documented by clinical staff.      Salvatore Marvel, MD  [facility]

## 2018-03-17 DIAGNOSIS — M10071 Idiopathic gout, right ankle and foot: Secondary | ICD-10-CM | POA: Diagnosis not present

## 2018-03-19 DIAGNOSIS — M1712 Unilateral primary osteoarthritis, left knee: Secondary | ICD-10-CM | POA: Diagnosis not present

## 2018-03-19 DIAGNOSIS — M7051 Other bursitis of knee, right knee: Secondary | ICD-10-CM | POA: Diagnosis not present

## 2018-03-19 DIAGNOSIS — M10071 Idiopathic gout, right ankle and foot: Secondary | ICD-10-CM | POA: Diagnosis not present

## 2018-03-27 DIAGNOSIS — E7800 Pure hypercholesterolemia, unspecified: Secondary | ICD-10-CM | POA: Diagnosis not present

## 2018-03-27 DIAGNOSIS — J069 Acute upper respiratory infection, unspecified: Secondary | ICD-10-CM | POA: Diagnosis not present

## 2018-03-27 DIAGNOSIS — I10 Essential (primary) hypertension: Secondary | ICD-10-CM | POA: Diagnosis not present

## 2018-03-27 DIAGNOSIS — Z6833 Body mass index (BMI) 33.0-33.9, adult: Secondary | ICD-10-CM | POA: Diagnosis not present

## 2018-03-27 DIAGNOSIS — Z299 Encounter for prophylactic measures, unspecified: Secondary | ICD-10-CM | POA: Diagnosis not present

## 2018-03-27 DIAGNOSIS — R350 Frequency of micturition: Secondary | ICD-10-CM | POA: Diagnosis not present

## 2018-03-28 DIAGNOSIS — E7800 Pure hypercholesterolemia, unspecified: Secondary | ICD-10-CM | POA: Diagnosis not present

## 2018-04-12 ENCOUNTER — Encounter: Payer: Self-pay | Admitting: Gastroenterology

## 2018-04-12 ENCOUNTER — Other Ambulatory Visit: Payer: Self-pay

## 2018-04-12 ENCOUNTER — Ambulatory Visit (INDEPENDENT_AMBULATORY_CARE_PROVIDER_SITE_OTHER): Payer: Medicare Other | Admitting: Gastroenterology

## 2018-04-12 VITALS — BP 121/71 | HR 101 | Temp 97.9°F | Ht 66.0 in | Wt 214.0 lb

## 2018-04-12 DIAGNOSIS — Z86010 Personal history of colonic polyps: Secondary | ICD-10-CM | POA: Diagnosis not present

## 2018-04-12 DIAGNOSIS — Z7901 Long term (current) use of anticoagulants: Secondary | ICD-10-CM | POA: Diagnosis not present

## 2018-04-12 DIAGNOSIS — R195 Other fecal abnormalities: Secondary | ICD-10-CM

## 2018-04-12 DIAGNOSIS — Z9889 Other specified postprocedural states: Secondary | ICD-10-CM

## 2018-04-12 MED ORDER — NA SULFATE-K SULFATE-MG SULF 17.5-3.13-1.6 GM/177ML PO SOLN: 1.0000 | ORAL | 0 refills | Status: DC

## 2018-04-12 NOTE — Assessment & Plan Note (Signed)
72 y/o female presenting to schedule colonoscopy for h/o positive cologuard testing. She has intermittent brbpr possibly benign anorectal source. She has prior h/o colon polyps, last tcs 2013, hyperplastic. Would agree with need for colonoscopy at this time. Her last PEs were in early 10/2017. Would advise holding Xarelto for 36 hours prior to colonoscopy with plans to resume asap after procedure.  I have discussed the risks, alternatives, benefits with regards to but not limited to the risk of reaction to medication, bleeding, infection, perforation and the patient is agreeable to proceed. Written consent to be obtained.  Recommend small volume prep given history of intolerances to prep in the past.

## 2018-04-12 NOTE — Patient Instructions (Addendum)
1. Colonoscopy as scheduled. See separate instructions.  

## 2018-04-12 NOTE — Progress Notes (Signed)
Primary Care Physician: Monico Blitz, MD  Primary Gastroenterologist:  Garfield Cornea, MD   Chief Complaint  Patient presents with  . Abdominal Pain    denies having any abd pain  . Colonoscopy    done about ? 59yrs ago. Reports had hard time doing the bowel prep  . Hemorrhoids    reports has internal/external    HPI: Lauren Hamilton is a 72 y.o. female here to schedule colonoscopy. She was referred by her previous PCP at [facility]. She is now followed by Dr. Manuella Ghazi in [place].   She reports that she completed Cologuard several months back and was advised by PCP at the time to follow up for a colonoscopy. She says it was positive. Patient was overdue for surveillance colonoscopy in 2018 for h/o colon polyps but due to h/o PEs, most recent one in 10/2017, colonoscopy was postponed and Cologuard ordered instead.   Initially had PEs in 2018. On eliquis for 10 months and then switched to Xarelto for 2 months due to cost. She was off Xarelto for two months (stopped after 12 months of anticoagulation at request of her provider) which she developed new PEs. She has been back on Xarelto for six months. Previously saw hematology who felt her age and obesity were risk factors for PEs. Due to medicare not paying for hypercoagulable work up this was not done. She reports that she was told she can come off Xarelto for 48 hours for procedures.   She denies any current UGI symptoms. Her reflux is well controlled, although did require increasing to omeprazole 40mg  daily from 20mg  daily about one year ago. No dysphagia. No vomiting. She has chronic constipation but currently going regularly on slippery elm supplement. No melena. She has intermittent brbpr which she suspects is due to internal/external hemorrhoids. No abdominal pain.   Her last colonoscopy was in 2013. She had hyperplastic polyps. Advised to come back in five years. unclear if previous history of adenomatous colon polyps. She  reports problems with vomiting with prep last time due to taste/amount.      Current Outpatient Medications  Medication Sig Dispense Refill  . albuterol (PROAIR HFA) 108 (90 Base) MCG/ACT inhaler Inhale 2 puffs into the lungs every 4 (four) hours as needed for wheezing or shortness of breath. 1 Inhaler 3  . Artificial Tear Solution (SOOTHE XP) SOLN Apply 1-2 drops to eye as needed.     . cyclobenzaprine (FLEXERIL) 10 MG tablet TAKE 1 TABLET BY MOUTH TWICE DAILY AS NEEDED FOR MUSCLE SPASMS    . EPINEPHrine 0.3 mg/0.3 mL IJ SOAJ injection See admin instructions.  0  . furosemide (LASIX) 20 MG tablet Take 20 mg by mouth daily as needed for fluid.    [name] lovastatin (MEVACOR) 10 MG tablet Take 10 mg by mouth at bedtime.    [name] METOPROLOL TARTRATE PO Take 10 mg by mouth at bedtime.     [name] omeprazole (PRILOSEC) 40 MG capsule Take 40 mg by mouth daily.    . rivaroxaban (XARELTO) 20 MG TABS tablet Take 1 tablet (20 mg total) by mouth daily with supper. Start using after you complete the starter pack 30 tablet 3  . triamterene-hydrochlorothiazide (MAXZIDE-25) 37.5-25 MG tablet Take 1 tablet by mouth every morning.     No current facility-administered medications for this visit.     Allergies as of 04/12/2018 - Review Complete 04/12/2018  Allergen Reaction Noted  . Etodolac  04/12/2018  . Hydrocodone  Other (See Comments) 10/13/2017  . Naproxen Other (See Comments) 11/10/2011  . Statins Other (See Comments) 10/13/2017  . Tramadol  10/20/2011  . Amlodipine Cough 01/25/2018  . Lisinopril Cough 01/25/2018  . Losartan Cough 01/25/2018   Past Medical History:  Diagnosis Date  . Arthritis   . 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 Bronchitis   . Gout   . H/O colonoscopy with polypectomy   . Hypercholesteremia   . Hypertension   . Moderate persistent  asthma, uncomplicated 02/13/4096  . Renal disorder    Past Surgical History:  Procedure Laterality Date  . carpel tunnel Bilateral   . CHOLECYSTECTOMY    . COLONOSCOPY  2013   [facility]: hyperplastic polyps. advised 5 year follow up colonoscopy.   [name] KNEE ARTHROSCOPY Right   . PARTIAL HYSTERECTOMY    . THYROIDECTOMY, PARTIAL    . TONSILLECTOMY     Family History  Problem Relation Age of Onset  . Allergic rhinitis Sister   . Allergic rhinitis Brother   . Hypertension Mother   . Heart attack Father   . Cirrhosis Father   . Emphysema Father   . Breast cancer Maternal Aunt   . Colon cancer Neg Hx    Social History   Tobacco Use  . Smoking status: Never Smoker  . Smokeless tobacco: Never Used  Substance Use Topics  . Alcohol use: Never    Frequency: Never  . Drug use: Never    ROS:  General: Negative for anorexia, weight loss, fever, chills, fatigue, weakness. ENT: Negative for hoarseness, difficulty swallowing , nasal congestion. CV: Negative for chest pain, angina, palpitations, dyspnea on exertion, peripheral edema.  Respiratory: Negative for dyspnea at rest, dyspnea on exertion, cough, sputum, wheezing.  GI: See history of present illness. GU:  Negative for dysuria, hematuria, urinary incontinence, urinary frequency, nocturnal urination.  Endo: Negative for unusual weight change.    Physical Examination:   BP 121/71   Pulse (!) 101   Temp 97.9 F (36.6 C) (Oral)   Ht 5\' 6"  (1.676 m)   Wt 214 lb (97.1 kg)   BMI 34.54 kg/m   General: Well-nourished, well-developed in no acute distress.  Eyes: No icterus. Mouth: Oropharyngeal mucosa moist and pink , no lesions erythema or exudate. Lungs: Clear to auscultation bilaterally.  Heart: Regular rate and rhythm, no murmurs rubs or gallops.  Abdomen: Bowel sounds are normal, nontender, nondistended, no hepatosplenomegaly or masses, no abdominal bruits or hernia , no rebound or guarding.     Extremities: No lower extremity edema. No clubbing or deformities. Neuro: Alert and oriented x 4   Skin: Warm and dry, no jaundice.   Psych: Alert and cooperative, normal mood and affect.  Labs:  Lab Results  Component Value Date   CREATININE 1.34 (H) 10/14/2017   BUN 23 (H) 10/14/2017   NA 137 10/14/2017   K 4.0 10/14/2017   CL 103 10/14/2017   CO2 23 10/14/2017   Lab Results  Component Value Date   ALT 31 10/13/2017   AST 39 10/13/2017   ALKPHOS 99 10/13/2017   BILITOT 0.8 10/13/2017   Lab Results  Component Value Date   WBC 8.4 10/14/2017   HGB 11.7 (L) 10/14/2017   HCT 36.2 10/14/2017   MCV 91.6 10/14/2017   PLT 241 10/14/2017   No results found for:  IRON, TIBC, FERRITIN   Imaging Studies: No results found.

## 2018-04-13 NOTE — Progress Notes (Signed)
CC'D TO PCP °

## 2018-04-20 ENCOUNTER — Telehealth: Payer: Self-pay | Admitting: Gastroenterology

## 2018-04-20 NOTE — Telephone Encounter (Signed)
Noted. Letter faxed to Dr. Manuella Ghazi. Called and spoke with the receptionist that will keep an eye out on the letter.

## 2018-04-20 NOTE — Telephone Encounter (Signed)
Please ask Dr. Manuella Ghazi if we can hold Xarelto 36 hours for colonoscopy.

## 2018-04-24 NOTE — Telephone Encounter (Signed)
Dr. Cyndi Bender called back from [facility] and said it's ok for pt to hold Xarelto 48 hrs prior to TCS. They called pt and let her know it was ok to proceed with procedure.

## 2018-04-24 NOTE — Telephone Encounter (Signed)
Lauren Hamilton, she is scheduled for procedure 06/12/18 at 8:30am. I had put on her instructions to hold Xarelto the day before procedure on 06/11/18. Do I need to let her know anything different?

## 2018-05-01 NOTE — Telephone Encounter (Signed)
Called and informed pt, verbalized understanding.

## 2018-05-01 NOTE — Telephone Encounter (Addendum)
Last dose of Xarelto evening of 06/10/18. No Xarelto 06/11/18. Procedure 06/12/18 at 730.   Just make sure she takes last dose of Xarelto prior to 7:30 PM on 06/10/18  Since she has early am procedure. This will allow full 36 hours off med. She is at risk of clots so want to minimize time of xarelto.

## 2018-05-08 ENCOUNTER — Ambulatory Visit: Payer: Medicare Other | Admitting: Allergy & Immunology

## 2018-05-09 ENCOUNTER — Telehealth: Payer: Self-pay | Admitting: *Deleted

## 2018-05-09 ENCOUNTER — Ambulatory Visit (INDEPENDENT_AMBULATORY_CARE_PROVIDER_SITE_OTHER): Payer: Medicare Other | Admitting: Allergy & Immunology

## 2018-05-09 ENCOUNTER — Encounter: Payer: Self-pay | Admitting: Allergy & Immunology

## 2018-05-09 ENCOUNTER — Other Ambulatory Visit: Payer: Self-pay | Admitting: Allergy & Immunology

## 2018-05-09 VITALS — BP 118/76 | HR 91 | Resp 12

## 2018-05-09 DIAGNOSIS — J3089 Other allergic rhinitis: Secondary | ICD-10-CM | POA: Diagnosis not present

## 2018-05-09 DIAGNOSIS — R0982 Postnasal drip: Secondary | ICD-10-CM

## 2018-05-09 DIAGNOSIS — J302 Other seasonal allergic rhinitis: Secondary | ICD-10-CM | POA: Diagnosis not present

## 2018-05-09 MED ORDER — CARBINOXAMINE MALEATE 4 MG PO TABS: 1.0000 | ORAL_TABLET | Freq: Three times a day (TID) | ORAL | 5 refills | Status: DC | PRN

## 2018-05-09 NOTE — Patient Instructions (Addendum)
1. Seasonal and perennial allergic rhinitis (indoor/outdoor molds) - Continue with: nasal saline rinses 1-2 times daily.  - Add on carbinoxamine 4mg  every 8 hours as needed (this can cause sleepiness). - Call us with an update on this.   2. Return in about 1 year (around 05/10/2019).   Please inform us of any Emergency Department visits, hospitalizations, or changes in symptoms. Call us before going to the ED for breathing or allergy symptoms since we might be able to fit you in for a sick visit. Feel free to contact us anytime with any questions, problems, or concerns.  It was a pleasure to see you again today!  Websites that have reliable patient information: 1. American Academy of Asthma, Allergy, and Immunology: www.aaaai.org 2. Food Allergy Research and Education (FARE): foodallergy.org 3. Mothers of Asthmatics: http://www.asthmacommunitynetwork.org 4. American College of Allergy, Asthma, and Immunology: MonthlyElectricBill.co.uk   Make sure you are registered to vote! If you have moved or changed any of your contact information, you will need to get this updated before voting!

## 2018-05-09 NOTE — Telephone Encounter (Signed)
Per Dr. Ernst Bowler patient has tried and failed Xyzal and Zyrtec. PA will need to be submitted.

## 2018-05-09 NOTE — Telephone Encounter (Signed)
A PA has been submitted for Carbinoxamine and is currently pending with Cover My Meds. Patient's Rx card has not been scanned into media. Called patient and received insurance card information for prescriptions. Please review below.  Member#: [id number] Plan #: [id number] Rx BIN: [id number] RxPCN: [id number] Group#: [id number] [facility] Medicare Rx Card

## 2018-05-09 NOTE — Telephone Encounter (Signed)
Error

## 2018-05-09 NOTE — Telephone Encounter (Signed)
Will initiate PA today.

## 2018-05-09 NOTE — Telephone Encounter (Signed)
Lauren Hamilton is not covered. Patient's insurance will cover Xyzal or Zyrtec. Please advise.

## 2018-05-09 NOTE — Progress Notes (Signed)
FOLLOW UP  Date of Service/Encounter:  05/09/18   Assessment:   Seasonal and perennial allergic rhinitis (indoor molds, outdoor molds)  Postnasal drip   Lauren Hamilton returns and is doing better from her breathing.  She did have a chronic cough for the last couple of visits, but this seems to have resolved itself.  We have tried treating this with asthma medications, including Breo at the last visit, but she has completed all of the samples and felt no different.  She does have albuterol on hand to use if needed, but she has not needed it in several months.  Her rhinitis symptoms continue to be a problem, predominantly in the form of postnasal drip.  Unfortunately, she is not able to tolerate any of the traditional medications used for this purpose including nasal sprays as well as antihistamines.  I did add montelukast at the last visit but this seems to have done nothing.  She is using nasal saline rinses 1-2 times daily which is the only thing that provide any kind of help.  Because of the postnasal drip as a predominant symptom, I think a trial of carbinoxamine could be very helpful.  We will send in the 4 mg tablet since this is generic and should be covered by her Medicare.  I did tell her to be careful since she does tend to be sensitive to new medications.  I also warned her that it could cause some fatigue.  Plan/Recommendations:   1. Seasonal and perennial allergic rhinitis (indoor/outdoor molds) - Continue with: nasal saline rinses 1-2 times daily.  - Add on carbinoxamine 4mg  every 8 hours as needed (this can cause sleepiness). - Call us with an update on this.   2. Return in about 1 year (around 05/10/2019).  Subjective:   Lauren Hamilton is a 72 y.o. female presenting today for follow up of  Chief Complaint  Patient presents with  . Allergic Rhinitis     no complaints    Lauren Hamilton has a history of the following: Patient Active Problem List   Diagnosis Date Noted    . Seasonal and perennial allergic rhinitis 05/09/2018  . Positive colorectal cancer screening using Cologuard test 04/12/2018  . Long term current use of anticoagulant therapy 04/12/2018  . Chronic rhinitis 01/12/2018  . Pulmonary embolism ([place]) 10/13/2017  . Hypertension   . Arthritis   . Renal disorder   . H/O colonoscopy with polypectomy     History obtained from: chart review and patient.  Lauren Hamilton's Primary Care Provider is Lauren Katz, Lauren Hamilton.     Lauren Hamilton is a 72 y.o. female presenting for a follow up visit.  She was last seen in August 2019 when we did intradermal testing.  It was reactive to indoor and outdoor mold, but was otherwise negative.  This was confusing since she had had multiple positives at a previous provider.  She had been on allergen immunotherapy for a couple of doses at her previous provider, but we were concerned that there was nothing in the vials since they were very clear.  She was going to discuss this with her insurance company.  She does have a history of a cough and her spirometry looked good.  Her cough had improved with the use of the inhaler so we continued her on Breo 200/25 mcg 1 puff once daily.  She does have Pro Air to use as needed.  For her rhinitis, we continue nasal saline rinses and added Singulair 10 mg  daily.  She does not tolerate nasal sprays or antihistamines due to increased glaucoma risk and overdrying, respectively.  Since the last visit, she has done very well. She did use up the Breo from the last visit and she has done well without it. She has not needed her albuterol inhaler at all. The cough has improved.   She has worked with [employer] and they are working on a case for insurance fraud against the previous provider as well as the serum company in [place]. Apparently she was charged for days that she was not even at the practice. Lauren Hamilton works in [place] with the [employer], which is how this started  becoming a case.   In any case, her rhinitis symptoms have been well controlled with the nasal saline rinses. She was on the montelukast but is no longer taking it.  She did not feel that this provided any improvement in her symptoms.  She does still endorse some postnasal drip and is considering an ENT referral.  She was recently diagnosed with gout and has been on prednisone intermittently for these attacks. Otherwise, there have been no changes to her past medical history, surgical history, family history, or social history.    Review of Systems: a 14-point review of systems is pertinent for what is mentioned in HPI.  Otherwise, all other systems were negative.  Constitutional: negative other than that listed in the HPI Eyes: negative other than that listed in the HPI Ears, nose, mouth, throat, and face: negative other than that listed in the HPI Respiratory: negative other than that listed in the HPI Cardiovascular: negative other than that listed in the HPI Gastrointestinal: negative other than that listed in the HPI Genitourinary: negative other than that listed in the HPI Integument: negative other than that listed in the HPI Hematologic: negative other than that listed in the HPI Musculoskeletal: negative other than that listed in the HPI Neurological: negative other than that listed in the HPI Allergy/Immunologic: negative other than that listed in the HPI    Objective:   Blood pressure 118/76, pulse 91, resp. rate 12, SpO2 94 %. There is no height or weight on file to calculate BMI.   Physical Exam:  General: Alert, interactive, in no acute distress.  Pleasant female.  Crocheting during the visit. Eyes: No conjunctival injection bilaterally, no discharge on the right, no discharge on the left and no Horner-Trantas dots present. PERRL bilaterally. EOMI without pain. No photophobia.  Ears: Right TM pearly gray with normal light reflex, Left TM pearly gray with normal light  reflex, Right TM intact without perforation and Left TM intact without perforation.  Nose/Throat: External nose within normal limits and septum midline. Turbinates edematous and pale with clear discharge. Posterior oropharynx erythematous without cobblestoning in the posterior oropharynx. Tonsils 2+ without exudates.  Tongue without thrush. Lungs: Clear to auscultation without wheezing, rhonchi or rales. No increased work of breathing. CV: Normal S1/S2. No murmurs. Capillary refill <2 seconds.  Skin: Warm and dry, without lesions or rashes. Neuro:   Grossly intact. No focal deficits appreciated. Responsive to questions.  Diagnostic studies: none    Salvatore Marvel, MD  [facility]

## 2018-05-31 ENCOUNTER — Telehealth: Payer: Self-pay | Admitting: *Deleted

## 2018-05-31 DIAGNOSIS — Z6834 Body mass index (BMI) 34.0-34.9, adult: Secondary | ICD-10-CM | POA: Diagnosis not present

## 2018-05-31 DIAGNOSIS — N183 Chronic kidney disease, stage 3 (moderate): Secondary | ICD-10-CM | POA: Diagnosis not present

## 2018-05-31 DIAGNOSIS — R5383 Other fatigue: Secondary | ICD-10-CM | POA: Diagnosis not present

## 2018-05-31 DIAGNOSIS — Z1331 Encounter for screening for depression: Secondary | ICD-10-CM | POA: Diagnosis not present

## 2018-05-31 DIAGNOSIS — M109 Gout, unspecified: Secondary | ICD-10-CM | POA: Diagnosis not present

## 2018-05-31 DIAGNOSIS — Z299 Encounter for prophylactic measures, unspecified: Secondary | ICD-10-CM | POA: Diagnosis not present

## 2018-05-31 DIAGNOSIS — I2699 Other pulmonary embolism without acute cor pulmonale: Secondary | ICD-10-CM | POA: Diagnosis not present

## 2018-05-31 DIAGNOSIS — Z2821 Immunization not carried out because of patient refusal: Secondary | ICD-10-CM | POA: Diagnosis not present

## 2018-05-31 DIAGNOSIS — Z7189 Other specified counseling: Secondary | ICD-10-CM | POA: Diagnosis not present

## 2018-05-31 DIAGNOSIS — Z1339 Encounter for screening examination for other mental health and behavioral disorders: Secondary | ICD-10-CM | POA: Diagnosis not present

## 2018-05-31 DIAGNOSIS — E7800 Pure hypercholesterolemia, unspecified: Secondary | ICD-10-CM | POA: Diagnosis not present

## 2018-05-31 DIAGNOSIS — Z0000 Encounter for general adult medical examination without abnormal findings: Secondary | ICD-10-CM | POA: Diagnosis not present

## 2018-05-31 NOTE — Telephone Encounter (Signed)
Patient is taking medication differently than prescribed. Please advise.

## 2018-05-31 NOTE — Telephone Encounter (Signed)
The lower dose is fine with me if it is working for her. I am not sure that there is a 2mg  tablet so she might have to continue to cut them in half.  Salvatore Marvel, MD [facility]

## 2018-05-31 NOTE — Telephone Encounter (Signed)
Patient called stating she is taking carbinoxamine and she is cutting it in half it was 4mg  and she is taknig 2mg , patient states the 2mg  works better than 4mg . Patient would like a 90 day supply in 2mg . [phone]   [facility]

## 2018-05-31 NOTE — Telephone Encounter (Signed)
Called and spoke with patient and she stated that was fine, she doesn't mind cutting them in half she just wanted to inform Dr. Ernst Bowler how she was taking it.

## 2018-06-07 DIAGNOSIS — I10 Essential (primary) hypertension: Secondary | ICD-10-CM | POA: Diagnosis not present

## 2018-06-07 DIAGNOSIS — Z299 Encounter for prophylactic measures, unspecified: Secondary | ICD-10-CM | POA: Diagnosis not present

## 2018-06-07 DIAGNOSIS — Z6834 Body mass index (BMI) 34.0-34.9, adult: Secondary | ICD-10-CM | POA: Diagnosis not present

## 2018-06-07 DIAGNOSIS — M109 Gout, unspecified: Secondary | ICD-10-CM | POA: Diagnosis not present

## 2018-06-12 ENCOUNTER — Encounter (HOSPITAL_COMMUNITY)
Admission: RE | Disposition: A | Discharge status: Home / Self Care | Payer: Self-pay | Source: Home / Self Care | Attending: Internal Medicine

## 2018-06-12 ENCOUNTER — Encounter (HOSPITAL_COMMUNITY): Payer: Self-pay | Admitting: *Deleted

## 2018-06-12 ENCOUNTER — Ambulatory Visit (HOSPITAL_COMMUNITY)
Admission: RE | Admit: 2018-06-12 | Discharge: 2018-06-12 | Disposition: A | Discharge status: Home / Self Care | Payer: Medicare Other | Attending: Internal Medicine | Admitting: Internal Medicine

## 2018-06-12 ENCOUNTER — Other Ambulatory Visit: Payer: Self-pay

## 2018-06-12 DIAGNOSIS — Z86711 Personal history of pulmonary embolism: Secondary | ICD-10-CM | POA: Diagnosis not present

## 2018-06-12 DIAGNOSIS — M109 Gout, unspecified: Secondary | ICD-10-CM | POA: Insufficient documentation

## 2018-06-12 DIAGNOSIS — R195 Other fecal abnormalities: Secondary | ICD-10-CM | POA: Diagnosis not present

## 2018-06-12 DIAGNOSIS — K635 Polyp of colon: Secondary | ICD-10-CM | POA: Insufficient documentation

## 2018-06-12 DIAGNOSIS — Z886 Allergy status to analgesic agent status: Secondary | ICD-10-CM | POA: Diagnosis not present

## 2018-06-12 DIAGNOSIS — Z7901 Long term (current) use of anticoagulants: Secondary | ICD-10-CM | POA: Insufficient documentation

## 2018-06-12 DIAGNOSIS — Z9889 Other specified postprocedural states: Secondary | ICD-10-CM

## 2018-06-12 DIAGNOSIS — Z888 Allergy status to other drugs, medicaments and biological substances status: Secondary | ICD-10-CM | POA: Diagnosis not present

## 2018-06-12 DIAGNOSIS — Z86010 Personal history of colonic polyps: Secondary | ICD-10-CM | POA: Diagnosis not present

## 2018-06-12 DIAGNOSIS — Z79899 Other long term (current) drug therapy: Secondary | ICD-10-CM | POA: Diagnosis not present

## 2018-06-12 DIAGNOSIS — D123 Benign neoplasm of transverse colon: Secondary | ICD-10-CM | POA: Insufficient documentation

## 2018-06-12 DIAGNOSIS — K642 Third degree hemorrhoids: Secondary | ICD-10-CM | POA: Diagnosis not present

## 2018-06-12 DIAGNOSIS — Z885 Allergy status to narcotic agent status: Secondary | ICD-10-CM | POA: Insufficient documentation

## 2018-06-12 DIAGNOSIS — E7800 Pure hypercholesterolemia, unspecified: Secondary | ICD-10-CM | POA: Diagnosis not present

## 2018-06-12 DIAGNOSIS — I10 Essential (primary) hypertension: Secondary | ICD-10-CM | POA: Insufficient documentation

## 2018-06-12 DIAGNOSIS — K644 Residual hemorrhoidal skin tags: Secondary | ICD-10-CM | POA: Insufficient documentation

## 2018-06-12 HISTORY — PX: POLYPECTOMY: SHX5525

## 2018-06-12 HISTORY — PX: COLONOSCOPY: SHX5424

## 2018-06-12 SURGERY — COLONOSCOPY
Anesthesia: Moderate Sedation

## 2018-06-12 MED ORDER — MIDAZOLAM HCL 5 MG/5ML IJ SOLN
INTRAMUSCULAR | Status: DC | PRN
  Administered 2018-06-12 (×3): 1 mg via INTRAVENOUS
  Administered 2018-06-12: 2 mg via INTRAVENOUS
  Administered 2018-06-12: 1 mg via INTRAVENOUS

## 2018-06-12 MED ORDER — MIDAZOLAM HCL 5 MG/5ML IJ SOLN
INTRAMUSCULAR | Status: AC
  Filled 2018-06-12: qty 10

## 2018-06-12 MED ORDER — MEPERIDINE HCL 50 MG/ML IJ SOLN
INTRAMUSCULAR | Status: AC
  Filled 2018-06-12: qty 1

## 2018-06-12 MED ORDER — ONDANSETRON HCL 4 MG/2ML IJ SOLN
INTRAMUSCULAR | Status: DC | PRN
  Administered 2018-06-12: 4 mg via INTRAVENOUS

## 2018-06-12 MED ORDER — MEPERIDINE HCL 100 MG/ML IJ SOLN
INTRAMUSCULAR | Status: DC | PRN
  Administered 2018-06-12: 25 mg
  Administered 2018-06-12: 15 mg

## 2018-06-12 MED ORDER — STERILE WATER FOR IRRIGATION IR SOLN
Status: DC | PRN
  Administered 2018-06-12: 1.5 mL

## 2018-06-12 MED ORDER — SODIUM CHLORIDE 0.9 % IV SOLN
INTRAVENOUS | Status: DC
  Administered 2018-06-12: 08:00:00 via INTRAVENOUS

## 2018-06-12 MED ORDER — ONDANSETRON HCL 4 MG/2ML IJ SOLN
INTRAMUSCULAR | Status: AC
  Filled 2018-06-12: qty 2

## 2018-06-12 NOTE — H&P (Signed)
@LOGO @   Primary Care Physician:  Arsenio Katz, NP Primary Gastroenterologist:  Dr. Gala Romney   Pre-Procedure History & Physical: HPI:  Lauren Hamilton is a 72 y.o. female here for positive fecal DNA testing.  Patient stopped taking Xarelto 3 days ago.  Past Medical History:  Diagnosis Date  . Arthritis   . 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 Bronchitis   . Gout   . H/O colonoscopy with polypectomy   . Hypercholesteremia   . Hypertension   . Renal disorder     Past Surgical History:  Procedure Laterality Date  . bilateral heel spur removed    . carpel tunnel Bilateral   . CHOLECYSTECTOMY    . COLONOSCOPY  2013   [facility]: hyperplastic polyps. advised 5 year follow up colonoscopy.   [name] KNEE ARTHROSCOPY Right   . PARTIAL HYSTERECTOMY    . THYROIDECTOMY, PARTIAL    . TONSILLECTOMY      Prior to Admission medications   Medication Sig Start Date End Date Taking? Authorizing Provider  allopurinol (ZYLOPRIM) 100 MG tablet Take 100 mg by mouth at bedtime.   Yes [provider]  Artificial Tear Solution (SOOTHE XP) SOLN Apply 1-2 drops to eye as needed (dry eyes).    Yes [provider]  Carbinoxamine Maleate 4 MG TABS TAKE 1 TABLET BY MOUTH EVERY 8 HOURS AS NEEDED Patient taking differently: Take 2 mg by mouth at bedtime.  05/14/18  Yes Valentina Shaggy, MD  Cholecalciferol (VITAMIN D3) 125 MCG (5000 UT) CAPS Take 5,000 Units by mouth daily.   Yes [provider]  cyclobenzaprine (FLEXERIL) 10 MG tablet Take 5 mg by mouth 2 (two) times daily as needed for muscle spasms.  04/04/16  Yes [provider]  furosemide (LASIX) 20 MG tablet Take 20 mg by mouth daily.    Yes [provider]  lovastatin (MEVACOR) 20 MG tablet Take 20 mg by mouth at bedtime.   Yes [provider]  metoprolol succinate (TOPROL-XL) 25 MG 24 hr tablet Take 12.5 mg by mouth daily.   Yes [provider]  Misc Natural Products (TART CHERRY ADVANCED PO) Take 425 mg by mouth daily.   Yes [provider]  Na Sulfate-K Sulfate-Mg Sulf (SUPREP BOWEL PREP KIT) 17.5-3.13-1.6 GM/177ML SOLN Take 1 kit by mouth as directed. 04/12/18  Yes Rourk, Cristopher Estimable, MD  omeprazole (PRILOSEC) 40 MG capsule Take 40 mg by mouth daily.   Yes [provider]  predniSONE (DELTASONE) 5 MG tablet Take 5 mg by mouth See admin instructions. (Typical regimens for 21 tablet dose packs of Methylprednisolone 67m, Prednisone 573m and Prednisone 1078mDay 1: 2 tabs before breakfast, 1 tab after lunch, 1 tab after supper, and 2 tabs at bedtime. Day 2: 1 tab before breakfast, 1 tab after lunch, 1 tab after supper, and 2 tabs at bedtime. Day 3: 1 tab before breakfast, 1 tab after lunch, 1 tab after supper, and 1 tab at bedtime. Day 4: 1 tab before breakfast, 1 tab after lunch, and 1 tab at bedtime. Day 5: 1 tab before breakfast and 1 tab at bedtime. Day 6: 1 tab before breakfast.   Yes [provider]  rivaroxaban (XARELTO) 20 MG TABS tablet Take 1 tablet (20 mg total) by mouth daily with supper. Start using after you complete the starter  pack 10/14/17  Yes Isaac Bliss, Rayford Halsted, MD  sodium chloride (OCEAN) 0.65 % SOLN nasal spray Place 1 spray into both nostrils as needed for congestion.   Yes [provider]  triamterene-hydrochlorothiazide (MAXZIDE-25) 37.5-25 MG tablet Take 1 tablet by mouth every morning.   Yes [provider]  vitamin C (ASCORBIC ACID) 500 MG tablet Take 500 mg by mouth daily.   Yes [provider]  vitamin E 400 UNIT capsule Take 400 Units by mouth daily.   Yes [provider]  albuterol (PROAIR HFA) 108 (90 Base) MCG/ACT inhaler Inhale 2 puffs into the lungs every 4 (four) hours as needed for wheezing or shortness of  breath. Patient not taking: Reported on 05/31/2018 01/12/18   Valentina Shaggy, MD    Allergies as of 04/12/2018 - Review Complete 04/12/2018  Allergen Reaction Noted  . Etodolac  04/12/2018  . Hydrocodone Other (See Comments) 10/13/2017  . Naproxen Other (See Comments) 11/10/2011  . Statins Other (See Comments) 10/13/2017  . Tramadol  10/20/2011  . Amlodipine Cough 01/25/2018  . Lisinopril Cough 01/25/2018  . Losartan Cough 01/25/2018    Family History  Problem Relation Age of Onset  . Allergic rhinitis Sister   . Allergic rhinitis Brother   . Hypertension Mother   . Heart attack Father   . Cirrhosis Father   . Emphysema Father   . Breast cancer Maternal Aunt   . Colon cancer Neg Hx     Social History   Socioeconomic History  . Marital status: Widowed    Spouse name: Not on file  . Number of children: Not on file  . Years of education: Not on file  . Highest education level: Not on file  Occupational History  . Not on file  Social Needs  . Financial resource strain: Not on file  . Food insecurity:    Worry: Not on file    Inability: Not on file  . Transportation needs:    Medical: Not on file    Non-medical: Not on file  Tobacco Use  . Smoking status: Never Smoker  . Smokeless tobacco: Never Used  Substance and Sexual Activity  . Alcohol use: Never    Frequency: Never  . Drug use: Never  . Sexual activity: Not Currently  Lifestyle  . Physical activity:    Days per week: Not on file    Minutes per session: Not on file  . Stress: Not on file  Relationships  . Social connections:    Talks on phone: Not on file    Gets together: Not on file    Attends religious service: Not on file    Active member of club or organization: Not on file    Attends meetings of clubs or organizations: Not on file    Relationship status: Not on file  . Intimate partner violence:    Fear of current or ex partner: Not on file    Emotionally abused: Not on file     Physically abused: Not on file    Forced sexual activity: Not on file  Other Topics Concern  . Not on file  Social History Narrative  . Not on file    Review of Systems: See HPI, otherwise negative ROS  Physical Exam: BP (!) 156/96   Pulse 78   Temp 97.8 F (36.6 C) (Oral)   Resp 15   Ht 5' 6"  (1.676 m)   Wt 93.4 kg   SpO2 100%   BMI  33.25 kg/m  General:   Alert,  Well-developed, well-nourished, pleasant and cooperative in NAD Neck:  Supple; no masses or thyromegaly. No significant cervical adenopathy. Lungs:  Clear throughout to auscultation.   No wheezes, crackles, or rhonchi. No acute distress. Heart:  Regular rate and rhythm; no murmurs, clicks, rubs,  or gallops. Abdomen: Non-distended, normal bowel sounds.  Soft and nontender without appreciable mass or hepatosplenomegaly.  Pulses:  Normal pulses noted. Extremities:  Without clubbing or edema.  Impression/Plan: 72 year old lady with a positive fecal DNA test.  Also a history of colonic polyps.  Here for colonoscopy. The risks, benefits, limitations, alternatives and imponderables have been reviewed with the patient. Questions have been answered. All parties are agreeable.      Notice: This dictation was prepared with Dragon dictation along with smaller phrase technology. Any transcriptional errors that result from this process are unintentional and may not be corrected upon review.

## 2018-06-12 NOTE — Discharge Instructions (Signed)
Colonoscopy Discharge Instructions  Read the instructions outlined below and refer to this sheet in the next few weeks. These discharge instructions provide you with general information on caring for yourself after you leave the hospital. Your doctor may also give you specific instructions. While your treatment has been planned according to the most current medical practices available, unavoidable complications occasionally occur. If you have any problems or questions after discharge, call Dr. Gala Romney at [phone]. ACTIVITY  You may resume your regular activity, but move at a slower pace for the next 24 hours.   Take frequent rest periods for the next 24 hours.   Walking will help get rid of the air and reduce the bloated feeling in your belly (abdomen).   No driving for 24 hours (because of the medicine (anesthesia) used during the test).    Do not sign any important legal documents or operate any machinery for 24 hours (because of the anesthesia used during the test).  NUTRITION  Drink plenty of fluids.   You may resume your normal diet as instructed by your doctor.   Begin with a light meal and progress to your normal diet. Heavy or fried foods are harder to digest and may make you feel sick to your stomach (nauseated).   Avoid alcoholic beverages for 24 hours or as instructed.  MEDICATIONS  You may resume your normal medications unless your doctor tells you otherwise.  WHAT YOU CAN EXPECT TODAY  Some feelings of bloating in the abdomen.   Passage of more gas than usual.   Spotting of blood in your stool or on the toilet paper.  IF YOU HAD POLYPS REMOVED DURING THE COLONOSCOPY:  No aspirin products for 7 days or as instructed.   No alcohol for 7 days or as instructed.   Eat a soft diet for the next 24 hours.  FINDING OUT THE RESULTS OF YOUR TEST Not all test results are available during your visit. If your test results are not back during the visit, make an appointment  with your caregiver to find out the results. Do not assume everything is normal if you have not heard from your caregiver or the medical facility. It is important for you to follow up on all of your test results.  SEEK IMMEDIATE MEDICAL ATTENTION IF:  You have more than a spotting of blood in your stool.   Your belly is swollen (abdominal distention).   You are nauseated or vomiting.   You have a temperature over 101.   You have abdominal pain or discomfort that is severe or gets worse throughout the day.    Colon polyp and diverticulosis information provided  Resume Xarelto January 2.  Further recommendations to follow pending review of pathology report     Colon Polyps  Polyps are tissue growths inside the body. Polyps can grow in many places, including the large intestine (colon). A polyp may be a round bump or a mushroom-shaped growth. You could have one polyp or several. Most colon polyps are noncancerous (benign). However, some colon polyps can become cancerous over time. Finding and removing the polyps early can help prevent this. What are the causes? The exact cause of colon polyps is not known. What increases the risk? You are more likely to develop this condition if you:  Have a family history of colon cancer or colon polyps.  Are older than 6 or older than 45 if you are African American.  Have inflammatory bowel disease, such as ulcerative colitis  or Crohn's disease.  Have certain hereditary conditions, such as: ? Familial adenomatous polyposis. ? Lynch syndrome. ? Turcot syndrome. ? Peutz-Jeghers syndrome.  Are overweight.  Smoke cigarettes.  Do not get enough exercise.  Drink too much alcohol.  Eat a diet that is high in fat and red meat and low in fiber.  Had childhood cancer that was treated with abdominal radiation. What are the signs or symptoms? Most polyps do not cause symptoms. If you have symptoms, they may include:  Blood coming from  your rectum when having a bowel movement.  Blood in your stool. The stool may look dark red or black.  Abdominal pain.  A change in bowel habits, such as constipation or diarrhea. How is this diagnosed? This condition is diagnosed with a colonoscopy. This is a procedure in which a lighted, flexible scope is inserted into the anus and then passed into the colon to examine the area. Polyps are sometimes found when a colonoscopy is done as part of routine cancer screening tests. How is this treated? Treatment for this condition involves removing any polyps that are found. Most polyps can be removed during a colonoscopy. Those polyps will then be tested for cancer. Additional treatment may be needed depending on the results of testing. Follow these instructions at home: Lifestyle  Maintain a healthy weight, or lose weight if recommended by your health care provider.  Exercise every day or as told by your health care provider.  Do not use any products that contain nicotine or tobacco, such as cigarettes and e-cigarettes. If you need help quitting, ask your health care provider.  If you drink alcohol, limit how much you have: ? 0-1 drink a day for women. ? 0-2 drinks a day for men.  Be aware of how much alcohol is in your drink. In the [place] one drink equals one 12 oz bottle of beer (355 mL), one 5 oz glass of wine (148 mL), or one 1 oz shot of hard liquor (44 mL). Eating and drinking   Eat foods that are high in fiber, such as fruits, vegetables, and whole grains.  Eat foods that are high in calcium and vitamin D, such as milk, cheese, yogurt, eggs, liver, fish, and broccoli.  Limit foods that are high in fat, such as fried foods and desserts.  Limit the amount of red meat and processed meat you eat, such as hot dogs, sausage, bacon, and lunch meats. General instructions  Keep all follow-up visits as told by your health care provider. This is important. ? This includes having  regularly scheduled colonoscopies. ? Talk to your health care provider about when you need a colonoscopy. Contact a health care provider if:  You have new or worsening bleeding during a bowel movement.  You have new or increased blood in your stool.  You have a change in bowel habits.  You lose weight for no known reason. Summary  Polyps are tissue growths inside the body. Polyps can grow in many places, including the colon.  Most colon polyps are noncancerous (benign), but some can become cancerous over time.  This condition is diagnosed with a colonoscopy.  Treatment for this condition involves removing any polyps that are found. Most polyps can be removed during a colonoscopy. This information is not intended to replace advice given to you by your health care provider. Make sure you discuss any questions you have with your health care provider. Document Released: 02/24/2004 Document Revised: 09/14/2017 Document Reviewed: 09/14/2017 Elsevier Interactive  Patient Education  2019 [employer]     Diverticulosis  Diverticulosis is a condition that develops when small pouches (diverticula) form in the wall of the large intestine (colon). The colon is where water is absorbed and stool is formed. The pouches form when the inside layer of the colon pushes through weak spots in the outer layers of the colon. You may have a few pouches or many of them. What are the causes? The cause of this condition is not known. What increases the risk? The following factors may make you more likely to develop this condition:  Being older than age 43. Your risk for this condition increases with age. Diverticulosis is rare among people younger than age 66. By age 62, many people have it.  Eating a low-fiber diet.  Having frequent constipation.  Being overweight.  Not getting enough exercise.  Smoking.  Taking over-the-counter pain medicines, like aspirin and ibuprofen.  Having a family  history of diverticulosis. What are the signs or symptoms? In most people, there are no symptoms of this condition. If you do have symptoms, they may include:  Bloating.  Cramps in the abdomen.  Constipation or diarrhea.  Pain in the lower left side of the abdomen. How is this diagnosed? This condition is most often diagnosed during an exam for other colon problems. Because diverticulosis usually has no symptoms, it often cannot be diagnosed independently. This condition may be diagnosed by:  Using a flexible scope to examine the colon (colonoscopy).  Taking an X-ray of the colon after dye has been put into the colon (barium enema).  Doing a CT scan. How is this treated? You may not need treatment for this condition if you have never developed an infection related to diverticulosis. If you have had an infection before, treatment may include:  Eating a high-fiber diet. This may include eating more fruits, vegetables, and grains.  Taking a fiber supplement.  Taking a live bacteria supplement (probiotic).  Taking medicine to relax your colon.  Taking antibiotic medicines. Follow these instructions at home:  Drink 6-8 glasses of water or more each day to prevent constipation.  Try not to strain when you have a bowel movement.  If you have had an infection before: ? Eat more fiber as directed by your health care provider or your diet and nutrition specialist (dietitian). ? Take a fiber supplement or probiotic, if your health care provider approves.  Take over-the-counter and prescription medicines only as told by your health care provider.  If you were prescribed an antibiotic, take it as told by your health care provider. Do not stop taking the antibiotic even if you start to feel better.  Keep all follow-up visits as told by your health care provider. This is important. Contact a health care provider if:  You have pain in your abdomen.  You have bloating.  You have  cramps.  You have not had a bowel movement in 3 days. Get help right away if:  Your pain gets worse.  Your bloating becomes very bad.  You have a fever or chills, and your symptoms suddenly get worse.  You vomit.  You have bowel movements that are bloody or black.  You have bleeding from your rectum. Summary  Diverticulosis is a condition that develops when small pouches (diverticula) form in the wall of the large intestine (colon).  You may have a few pouches or many of them.  This condition is most often diagnosed during an exam for other  colon problems.  If you have had an infection related to diverticulosis, treatment may include increasing the fiber in your diet, taking supplements, or taking medicines. This information is not intended to replace advice given to you by your health care provider. Make sure you discuss any questions you have with your health care provider. Document Released: 02/25/2004 Document Revised: 04/18/2016 Document Reviewed: 04/18/2016 Elsevier Interactive Patient Education  2019 [employer].

## 2018-06-12 NOTE — Op Note (Signed)
[facility] Patient Name: Lauren Hamilton Procedure Date: 06/12/2018 8:02 AM MRN: [id number] Date of Birth: 01/27/1946 Attending MD: Norvel Richards , MD CSN: [id number] Age: 72 Admit Type: Outpatient Procedure:                Colonoscopy Indications:              Positive Cologuard test Providers:                Norvel Richards, MD, Gerome Sam, RN,                            Aram Candela Referring MD:              Medicines:                Midazolam 6 mg IV, Meperidine 40 mg IV, Ondansetron                            4 mg IV Complications:            No immediate complications. Estimated Blood Loss:     Estimated blood loss was minimal. Procedure:                Pre-Anesthesia Assessment:                           - Prior to the procedure, a History and Physical                            was performed, and patient medications and                            allergies were reviewed. The patient's tolerance of                            previous anesthesia was also reviewed. The risks                            and benefits of the procedure and the sedation                            options and risks were discussed with the patient.                            All questions were answered, and informed consent                            was obtained. Prior Anticoagulants: The patient                            last took Xarelto (rivaroxaban) 4 days prior to the                            procedure. ASA Grade Assessment: II - A patient  with mild systemic disease. After reviewing the                            risks and benefits, the patient was deemed in                            satisfactory condition to undergo the procedure.                           After obtaining informed consent, the colonoscope                            was passed under direct vision. Throughout the                            procedure, the patient's blood  pressure, pulse, and                            oxygen saturations were monitored continuously. The                            CF-HQ190L ([id number]) scope was introduced through                            the anus and advanced to the the cecum, identified                            by appendiceal orifice and ileocecal valve. The                            colonoscopy was performed without difficulty. The                            patient tolerated the procedure well. The quality                            of the bowel preparation was adequate. Scope In: 8:30:43 AM Scope Out: 8:49:26 AM Scope Withdrawal Time: 0 hours 11 minutes 47 seconds  Total Procedure Duration: 0 hours 18 minutes 43 seconds  Findings:      The perianal and digital rectal examinations were normal.      Three sessile polyps were found in the hepatic flexure. The polyps were       4 to 6 mm in size. These polyps were removed with a cold snare.       Resection and retrieval were complete. Estimated blood loss was minimal.      A 7 mm polyp was found in the hepatic flexure. The polyp was sessile.       The polyp was removed with a hot snare. Resection and retrieval were       complete. Estimated blood loss: none.      Non-bleeding external and internal hemorrhoids were found. The       hemorrhoids were moderate, medium-sized and Grade III (internal       hemorrhoids that prolapse but require manual reduction).  The exam was otherwise without abnormality on direct and retroflexion       views. Impression:               - Three 4 to 6 mm polyps at the hepatic flexure,                            removed with a cold snare. Resected and retrieved.                           - One 7 mm polyp at the hepatic flexure, removed                            with a hot snare. Resected and retrieved.                           - Non-bleeding external and internal hemorrhoids.                           - The examination was otherwise  normal on direct                            and retroflexion views. Moderate Sedation:      Moderate (conscious) sedation was administered by the endoscopy nurse       and supervised by the endoscopist. The following parameters were       monitored: oxygen saturation, heart rate, blood pressure, respiratory       rate, EKG, adequacy of pulmonary ventilation, and response to care.       Total physician intraservice time was 27 minutes. Recommendation:           - Patient has a contact number available for                            emergencies. The signs and symptoms of potential                            delayed complications were discussed with the                            patient. Return to normal activities tomorrow.                            Written discharge instructions were provided to the                            patient.                           - Resume previous diet.                           - Continue present medications.                           - Repeat colonoscopy date to be determined after  pending pathology results are reviewed for                            surveillance based on pathology results.                           - Return to GI office (date not yet determined).                            resume Xarelto January 2nd, 2020. Procedure Code(s):        --- Professional ---                           [phone], Colonoscopy, flexible; with removal of                            tumor(s), polyp(s), or other lesion(s) by snare                            technique                           99153, Moderate sedation; each additional 15                            minutes intraservice time                           G0500, Moderate sedation services provided by the                            same physician or other qualified health care                            professional performing a gastrointestinal                            endoscopic  service that sedation supports,                            requiring the presence of an independent trained                            observer to assist in the monitoring of the                            patient's level of consciousness and physiological                            status; initial 15 minutes of intra-service time;                            patient age 39 years or older (additional time 30  be reported with [phone], as appropriate) Diagnosis Code(s):        --- Professional ---                           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 All rights reserved. The codes documented in this report are preliminary and upon coder review may  be revised to meet current compliance requirements. Cristopher Estimable. Shalene Gallen, MD Norvel Richards, MD 06/12/2018 8:58:23 AM This report has been signed electronically. Number of Addenda: 0

## 2018-06-16 ENCOUNTER — Encounter: Payer: Self-pay | Admitting: Internal Medicine

## 2018-06-19 ENCOUNTER — Encounter (HOSPITAL_COMMUNITY): Payer: Self-pay | Admitting: Internal Medicine

## 2018-07-16 DIAGNOSIS — H40053 Ocular hypertension, bilateral: Secondary | ICD-10-CM | POA: Diagnosis not present

## 2018-07-16 DIAGNOSIS — H16223 Keratoconjunctivitis sicca, not specified as Sjogren's, bilateral: Secondary | ICD-10-CM | POA: Diagnosis not present

## 2018-11-19 DIAGNOSIS — H5203 Hypermetropia, bilateral: Secondary | ICD-10-CM | POA: Diagnosis not present

## 2018-11-19 DIAGNOSIS — H524 Presbyopia: Secondary | ICD-10-CM | POA: Diagnosis not present

## 2018-11-19 DIAGNOSIS — H5211 Myopia, right eye: Secondary | ICD-10-CM | POA: Diagnosis not present

## 2018-11-19 DIAGNOSIS — H40053 Ocular hypertension, bilateral: Secondary | ICD-10-CM | POA: Diagnosis not present

## 2019-01-28 DIAGNOSIS — G8929 Other chronic pain: Secondary | ICD-10-CM | POA: Insufficient documentation

## 2019-02-01 ENCOUNTER — Telehealth: Payer: Self-pay | Admitting: *Deleted

## 2019-02-01 NOTE — Telephone Encounter (Signed)
Called and spoke with patient regarding vials we were holding for patient.  Patient informed vials are now expired.  Per patient, she would like for [place] to still hold vials here at [facility] office until she contacts her agent and she will call us back to let us know if she will pick them up or we can dispose of vials.  Dr. Ernst Bowler informed of call with patient.

## 2019-02-21 DIAGNOSIS — H25041 Posterior subcapsular polar age-related cataract, right eye: Secondary | ICD-10-CM | POA: Diagnosis not present

## 2019-02-21 DIAGNOSIS — H40053 Ocular hypertension, bilateral: Secondary | ICD-10-CM | POA: Diagnosis not present

## 2019-06-05 ENCOUNTER — Other Ambulatory Visit: Payer: Self-pay | Admitting: Nephrology

## 2019-06-05 ENCOUNTER — Other Ambulatory Visit (HOSPITAL_COMMUNITY): Payer: Self-pay | Admitting: Nephrology

## 2019-06-05 DIAGNOSIS — N170 Acute kidney failure with tubular necrosis: Secondary | ICD-10-CM

## 2019-06-17 ENCOUNTER — Ambulatory Visit (HOSPITAL_COMMUNITY)
Admission: RE | Admit: 2019-06-17 | Discharge: 2019-06-17 | Disposition: A | Discharge status: Home / Self Care | Payer: PPO | Source: Ambulatory Visit | Attending: Nephrology | Admitting: Nephrology

## 2019-06-17 ENCOUNTER — Other Ambulatory Visit: Payer: Self-pay

## 2019-06-17 DIAGNOSIS — I129 Hypertensive chronic kidney disease with stage 1 through stage 4 chronic kidney disease, or unspecified chronic kidney disease: Secondary | ICD-10-CM | POA: Diagnosis not present

## 2019-06-17 DIAGNOSIS — N179 Acute kidney failure, unspecified: Secondary | ICD-10-CM | POA: Diagnosis not present

## 2019-06-17 DIAGNOSIS — N184 Chronic kidney disease, stage 4 (severe): Secondary | ICD-10-CM | POA: Diagnosis not present

## 2019-06-17 DIAGNOSIS — N170 Acute kidney failure with tubular necrosis: Secondary | ICD-10-CM | POA: Diagnosis not present

## 2019-06-17 DIAGNOSIS — D638 Anemia in other chronic diseases classified elsewhere: Secondary | ICD-10-CM | POA: Diagnosis not present

## 2019-07-11 DIAGNOSIS — I129 Hypertensive chronic kidney disease with stage 1 through stage 4 chronic kidney disease, or unspecified chronic kidney disease: Secondary | ICD-10-CM | POA: Diagnosis not present

## 2019-07-11 DIAGNOSIS — N1831 Chronic kidney disease, stage 3a: Secondary | ICD-10-CM | POA: Diagnosis not present

## 2019-07-11 DIAGNOSIS — R809 Proteinuria, unspecified: Secondary | ICD-10-CM | POA: Diagnosis not present

## 2019-07-11 DIAGNOSIS — E1122 Type 2 diabetes mellitus with diabetic chronic kidney disease: Secondary | ICD-10-CM | POA: Diagnosis not present

## 2019-07-11 DIAGNOSIS — N170 Acute kidney failure with tubular necrosis: Secondary | ICD-10-CM | POA: Diagnosis not present

## 2019-07-11 DIAGNOSIS — M10272 Drug-induced gout, left ankle and foot: Secondary | ICD-10-CM | POA: Diagnosis not present

## 2019-07-11 DIAGNOSIS — N189 Chronic kidney disease, unspecified: Secondary | ICD-10-CM | POA: Diagnosis not present

## 2019-07-15 DIAGNOSIS — Z6834 Body mass index (BMI) 34.0-34.9, adult: Secondary | ICD-10-CM | POA: Diagnosis not present

## 2019-07-15 DIAGNOSIS — E1129 Type 2 diabetes mellitus with other diabetic kidney complication: Secondary | ICD-10-CM | POA: Diagnosis not present

## 2019-07-15 DIAGNOSIS — I10 Essential (primary) hypertension: Secondary | ICD-10-CM | POA: Diagnosis not present

## 2019-07-15 DIAGNOSIS — Z299 Encounter for prophylactic measures, unspecified: Secondary | ICD-10-CM | POA: Diagnosis not present

## 2019-07-15 DIAGNOSIS — Z789 Other specified health status: Secondary | ICD-10-CM | POA: Diagnosis not present

## 2019-07-15 DIAGNOSIS — E1165 Type 2 diabetes mellitus with hyperglycemia: Secondary | ICD-10-CM | POA: Diagnosis not present

## 2019-07-15 DIAGNOSIS — G43909 Migraine, unspecified, not intractable, without status migrainosus: Secondary | ICD-10-CM | POA: Diagnosis not present

## 2019-07-15 DIAGNOSIS — J309 Allergic rhinitis, unspecified: Secondary | ICD-10-CM | POA: Diagnosis not present

## 2019-08-02 DIAGNOSIS — M1712 Unilateral primary osteoarthritis, left knee: Secondary | ICD-10-CM | POA: Diagnosis not present

## 2019-08-02 DIAGNOSIS — M7051 Other bursitis of knee, right knee: Secondary | ICD-10-CM | POA: Diagnosis not present

## 2019-08-21 DIAGNOSIS — I10 Essential (primary) hypertension: Secondary | ICD-10-CM | POA: Diagnosis not present

## 2019-08-21 DIAGNOSIS — Z299 Encounter for prophylactic measures, unspecified: Secondary | ICD-10-CM | POA: Diagnosis not present

## 2019-08-21 DIAGNOSIS — E7800 Pure hypercholesterolemia, unspecified: Secondary | ICD-10-CM | POA: Diagnosis not present

## 2019-08-21 DIAGNOSIS — E1165 Type 2 diabetes mellitus with hyperglycemia: Secondary | ICD-10-CM | POA: Diagnosis not present

## 2019-08-21 DIAGNOSIS — N184 Chronic kidney disease, stage 4 (severe): Secondary | ICD-10-CM | POA: Diagnosis not present

## 2019-08-23 DIAGNOSIS — E8940 Asymptomatic postprocedural ovarian failure: Secondary | ICD-10-CM | POA: Diagnosis not present

## 2019-08-28 DIAGNOSIS — Z789 Other specified health status: Secondary | ICD-10-CM | POA: Diagnosis not present

## 2019-08-28 DIAGNOSIS — I10 Essential (primary) hypertension: Secondary | ICD-10-CM | POA: Diagnosis not present

## 2019-08-28 DIAGNOSIS — Z299 Encounter for prophylactic measures, unspecified: Secondary | ICD-10-CM | POA: Diagnosis not present

## 2019-09-02 DIAGNOSIS — Z85820 Personal history of malignant melanoma of skin: Secondary | ICD-10-CM | POA: Diagnosis not present

## 2019-09-02 DIAGNOSIS — D485 Neoplasm of uncertain behavior of skin: Secondary | ICD-10-CM | POA: Diagnosis not present

## 2019-09-02 DIAGNOSIS — Z85828 Personal history of other malignant neoplasm of skin: Secondary | ICD-10-CM | POA: Diagnosis not present

## 2019-09-02 DIAGNOSIS — L570 Actinic keratosis: Secondary | ICD-10-CM | POA: Diagnosis not present

## 2019-09-02 DIAGNOSIS — L821 Other seborrheic keratosis: Secondary | ICD-10-CM | POA: Diagnosis not present

## 2019-09-06 DIAGNOSIS — I129 Hypertensive chronic kidney disease with stage 1 through stage 4 chronic kidney disease, or unspecified chronic kidney disease: Secondary | ICD-10-CM | POA: Diagnosis not present

## 2019-09-06 DIAGNOSIS — E1122 Type 2 diabetes mellitus with diabetic chronic kidney disease: Secondary | ICD-10-CM | POA: Diagnosis not present

## 2019-09-06 DIAGNOSIS — M10272 Drug-induced gout, left ankle and foot: Secondary | ICD-10-CM | POA: Diagnosis not present

## 2019-09-06 DIAGNOSIS — R809 Proteinuria, unspecified: Secondary | ICD-10-CM | POA: Diagnosis not present

## 2019-09-06 DIAGNOSIS — N189 Chronic kidney disease, unspecified: Secondary | ICD-10-CM | POA: Diagnosis not present

## 2019-09-06 DIAGNOSIS — N1831 Chronic kidney disease, stage 3a: Secondary | ICD-10-CM | POA: Diagnosis not present

## 2019-09-06 DIAGNOSIS — N170 Acute kidney failure with tubular necrosis: Secondary | ICD-10-CM | POA: Diagnosis not present

## 2019-09-11 DIAGNOSIS — N189 Chronic kidney disease, unspecified: Secondary | ICD-10-CM | POA: Diagnosis not present

## 2019-09-11 DIAGNOSIS — I129 Hypertensive chronic kidney disease with stage 1 through stage 4 chronic kidney disease, or unspecified chronic kidney disease: Secondary | ICD-10-CM | POA: Diagnosis not present

## 2019-09-11 DIAGNOSIS — N170 Acute kidney failure with tubular necrosis: Secondary | ICD-10-CM | POA: Diagnosis not present

## 2019-09-11 DIAGNOSIS — R809 Proteinuria, unspecified: Secondary | ICD-10-CM | POA: Diagnosis not present

## 2019-09-11 DIAGNOSIS — E1122 Type 2 diabetes mellitus with diabetic chronic kidney disease: Secondary | ICD-10-CM | POA: Diagnosis not present

## 2019-09-11 DIAGNOSIS — E876 Hypokalemia: Secondary | ICD-10-CM | POA: Diagnosis not present

## 2019-09-11 DIAGNOSIS — Z79899 Other long term (current) drug therapy: Secondary | ICD-10-CM | POA: Diagnosis not present

## 2019-09-18 DIAGNOSIS — Z299 Encounter for prophylactic measures, unspecified: Secondary | ICD-10-CM | POA: Diagnosis not present

## 2019-09-18 DIAGNOSIS — Z713 Dietary counseling and surveillance: Secondary | ICD-10-CM | POA: Diagnosis not present

## 2019-09-18 DIAGNOSIS — E1165 Type 2 diabetes mellitus with hyperglycemia: Secondary | ICD-10-CM | POA: Diagnosis not present

## 2019-09-18 DIAGNOSIS — Z6834 Body mass index (BMI) 34.0-34.9, adult: Secondary | ICD-10-CM | POA: Diagnosis not present

## 2019-09-18 DIAGNOSIS — I10 Essential (primary) hypertension: Secondary | ICD-10-CM | POA: Diagnosis not present

## 2019-10-10 DIAGNOSIS — Z1231 Encounter for screening mammogram for malignant neoplasm of breast: Secondary | ICD-10-CM | POA: Diagnosis not present

## 2019-10-18 DIAGNOSIS — E1165 Type 2 diabetes mellitus with hyperglycemia: Secondary | ICD-10-CM | POA: Diagnosis not present

## 2019-10-18 DIAGNOSIS — Z299 Encounter for prophylactic measures, unspecified: Secondary | ICD-10-CM | POA: Diagnosis not present

## 2019-10-18 DIAGNOSIS — E1122 Type 2 diabetes mellitus with diabetic chronic kidney disease: Secondary | ICD-10-CM | POA: Diagnosis not present

## 2019-10-18 DIAGNOSIS — I700 Atherosclerosis of aorta: Secondary | ICD-10-CM | POA: Diagnosis not present

## 2019-10-18 DIAGNOSIS — N184 Chronic kidney disease, stage 4 (severe): Secondary | ICD-10-CM | POA: Diagnosis not present

## 2019-10-18 DIAGNOSIS — I10 Essential (primary) hypertension: Secondary | ICD-10-CM | POA: Diagnosis not present

## 2019-10-18 DIAGNOSIS — E119 Type 2 diabetes mellitus without complications: Secondary | ICD-10-CM | POA: Diagnosis not present

## 2019-10-25 DIAGNOSIS — N184 Chronic kidney disease, stage 4 (severe): Secondary | ICD-10-CM | POA: Diagnosis not present

## 2019-10-25 DIAGNOSIS — E1165 Type 2 diabetes mellitus with hyperglycemia: Secondary | ICD-10-CM | POA: Diagnosis not present

## 2019-10-25 DIAGNOSIS — Z299 Encounter for prophylactic measures, unspecified: Secondary | ICD-10-CM | POA: Diagnosis not present

## 2019-10-25 DIAGNOSIS — I10 Essential (primary) hypertension: Secondary | ICD-10-CM | POA: Diagnosis not present

## 2019-10-25 DIAGNOSIS — I700 Atherosclerosis of aorta: Secondary | ICD-10-CM | POA: Diagnosis not present

## 2019-10-31 ENCOUNTER — Other Ambulatory Visit: Payer: Self-pay

## 2019-10-31 ENCOUNTER — Emergency Department (HOSPITAL_COMMUNITY)
Admission: EM | Admit: 2019-10-31 | Discharge: 2019-10-31 | Disposition: A | Discharge status: Home / Self Care | Payer: PPO | Attending: Emergency Medicine | Admitting: Emergency Medicine

## 2019-10-31 ENCOUNTER — Encounter (HOSPITAL_COMMUNITY): Payer: Self-pay

## 2019-10-31 ENCOUNTER — Emergency Department (HOSPITAL_COMMUNITY): Payer: PPO

## 2019-10-31 DIAGNOSIS — R079 Chest pain, unspecified: Secondary | ICD-10-CM | POA: Insufficient documentation

## 2019-10-31 DIAGNOSIS — Z79899 Other long term (current) drug therapy: Secondary | ICD-10-CM | POA: Diagnosis not present

## 2019-10-31 DIAGNOSIS — R42 Dizziness and giddiness: Secondary | ICD-10-CM | POA: Insufficient documentation

## 2019-10-31 DIAGNOSIS — I10 Essential (primary) hypertension: Secondary | ICD-10-CM | POA: Diagnosis not present

## 2019-10-31 DIAGNOSIS — R071 Chest pain on breathing: Secondary | ICD-10-CM | POA: Diagnosis not present

## 2019-10-31 DIAGNOSIS — Z7901 Long term (current) use of anticoagulants: Secondary | ICD-10-CM | POA: Diagnosis not present

## 2019-10-31 DIAGNOSIS — R0602 Shortness of breath: Secondary | ICD-10-CM | POA: Diagnosis not present

## 2019-10-31 DIAGNOSIS — R0789 Other chest pain: Secondary | ICD-10-CM | POA: Diagnosis not present

## 2019-10-31 LAB — CBC WITH DIFFERENTIAL/PLATELET
Abs Immature Granulocytes: 0.03 10*3/uL (ref 0.00–0.07)
Basophils Absolute: 0.1 10*3/uL (ref 0.0–0.1)
Basophils Relative: 1 %
Eosinophils Absolute: 0.5 10*3/uL (ref 0.0–0.5)
Eosinophils Relative: 7 %
HCT: 44.3 % (ref 36.0–46.0)
Hemoglobin: 13.7 g/dL (ref 12.0–15.0)
Immature Granulocytes: 0 %
Lymphocytes Relative: 33 %
Lymphs Abs: 2.2 10*3/uL (ref 0.7–4.0)
MCH: 29.1 pg (ref 26.0–34.0)
MCHC: 30.9 g/dL (ref 30.0–36.0)
MCV: 94.1 fL (ref 80.0–100.0)
Monocytes Absolute: 0.5 10*3/uL (ref 0.1–1.0)
Monocytes Relative: 8 %
Neutro Abs: 3.5 10*3/uL (ref 1.7–7.7)
Neutrophils Relative %: 51 %
Platelets: 226 10*3/uL (ref 150–400)
RBC: 4.71 MIL/uL (ref 3.87–5.11)
RDW: 15.1 % (ref 11.5–15.5)
WBC: 6.7 10*3/uL (ref 4.0–10.5)
nRBC: 0 % (ref 0.0–0.2)

## 2019-10-31 LAB — COMPREHENSIVE METABOLIC PANEL
ALT: 27 U/L (ref 0–44)
AST: 30 U/L (ref 15–41)
Albumin: 4.3 g/dL (ref 3.5–5.0)
Alkaline Phosphatase: 116 U/L (ref 38–126)
Anion gap: 11 (ref 5–15)
BUN: 25 mg/dL — ABNORMAL HIGH (ref 8–23)
CO2: 29 mmol/L (ref 22–32)
Calcium: 9.7 mg/dL (ref 8.9–10.3)
Chloride: 97 mmol/L — ABNORMAL LOW (ref 98–111)
Creatinine, Ser: 1.3 mg/dL — ABNORMAL HIGH (ref 0.44–1.00)
GFR calc Af Amer: 47 mL/min — ABNORMAL LOW (ref >60)
GFR calc non Af Amer: 41 mL/min — ABNORMAL LOW (ref >60)
Glucose, Bld: 118 mg/dL — ABNORMAL HIGH (ref 70–99)
Potassium: 3.3 mmol/L — ABNORMAL LOW (ref 3.5–5.1)
Sodium: 137 mmol/L (ref 135–145)
Total Bilirubin: 0.9 mg/dL (ref 0.3–1.2)
Total Protein: 6.9 g/dL (ref 6.5–8.1)

## 2019-10-31 LAB — TROPONIN I (HIGH SENSITIVITY)
Troponin I (High Sensitivity): 5 ng/L (ref <18)
Troponin I (High Sensitivity): 6 ng/L (ref <18)

## 2019-10-31 MED ORDER — MORPHINE SULFATE (PF) 4 MG/ML IV SOLN
4.0000 mg | Freq: Once | INTRAVENOUS | Status: AC
  Administered 2019-10-31: 4 mg via INTRAVENOUS
  Filled 2019-10-31: qty 1

## 2019-10-31 MED ORDER — ONDANSETRON HCL 4 MG/2ML IJ SOLN
4.0000 mg | Freq: Once | INTRAMUSCULAR | Status: AC
  Administered 2019-10-31: 4 mg via INTRAVENOUS
  Filled 2019-10-31: qty 2

## 2019-10-31 NOTE — ED Notes (Signed)
Pt verbalized understanding of discharge instructions and need to attend follow up appointment.nad noted. E-signature pad not working.

## 2019-10-31 NOTE — Discharge Instructions (Addendum)
As we discussed, your work-up here looked reassuring.  We have discussed with the cardiologist who has arranged for you for to have an appointment next week.  Please follow-up with them as directed.  Return to the Emergency Department immediately if you experiencing worsening chest pain, difficulty breathing, nausea/vomiting, get very sweaty, headache or any other worsening or concerning symptoms.

## 2019-10-31 NOTE — ED Triage Notes (Signed)
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 Reports some SOB and HA. Pt reports that she has been having issues with her BP being high and is suppose to followup with cardiology

## 2019-10-31 NOTE — ED Provider Notes (Signed)
  Face-to-face evaluation   History: She is here for evaluation of high blood pressure, and some chest tightness which occurred while walking today.  Tightness is worsened when up and moving, goes away when she rests.  She also notices the pain sometimes when taking a deep breath or palpating the chest.  Physical exam: Elderly, alert, comfortable.  Heart regular rate and rhythm without murmur.  Lungs clear to auscultation.  No increased work of breathing.   Medical screening examination/treatment/procedure(s) were conducted as a shared visit with non-physician practitioner(s) and myself.  I personally evaluated the patient during the encounter    Daleen Bo, MD 11/01/19 [phone]

## 2019-10-31 NOTE — ED Provider Notes (Signed)
[facility] EMERGENCY DEPARTMENT Provider Note   CSN: [id number] Arrival date & time: 10/31/19  1225     History Chief Complaint  Patient presents with  . Chest Pain    Lauren Hamilton is a 74 y.o. 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  She states that this morning at about 9:30-10 AM, she was working in her garden.  She states she walked from her house to the mailbox to the garden and started having some left-sided chest pain.  The chest pain did not radiate.  She described it as a tightness or a pulling sensation.  She states it got worse as she continued to walk around.  No associated diaphoresis, nausea/vomiting.  She did feel like she had some mild shortness of breath associated with it.  Since then, the chest pain has been constant.  She has not taken any medications for it.  She states it does not radiate anywhere.  She did feel like it is worse when she exerted herself and walked around.  She did not feel like it was worse with deep inspiration but when she came to the ED, she still felt like it may have been worse with deep inspiration.  She states she has not had this pain before.  She denies any trauma, injury, fall.  No recent sicknesses with fevers, cough.  She has history of PE and states she is on Xarelto and has not missed any doses.  Patient also reports that she is concerned about her blood pressure.  She reports that since April, she has had elevated readings and she checks her blood pressure about 2-3 times a day.  She was prescribed Klonopin but states that she did not like the way it made her feel so she stopped taking it.  Then she became concerned that her blood pressure was elevated so she started taking it again.  She states that sometimes she will feel dizzy and lightheaded.  Currently denies of sensations.  She reports that  usually when she has that, her blood pressure is elevated.  She does not smoke and denies any cocaine use.  She reports that she was recently diagnosed with diabetes in February.  She is not on any medication.  She reports history of hypertension.  No personal cardiac history.  No family history of MI before the age of 79.  She reports that her primary care doctor had referred her to a cardiologist and states that she is supposed to see you someone next week but she felt like that was too long to wait so she came to the emergency department today.  She denies any vision changes, abdominal pain, nausea/vomiting, numbness/weakness of her arms or legs, difficulty walking.zac  The history is provided by the patient.    HPI: A 74 year old patient with a history of treated diabetes, hypertension and hypercholesterolemia presents for evaluation of chest pain. Initial onset of pain was approximately 1-3 hours ago. The patient's chest pain is described as heaviness/pressure/tightness and is worse with exertion. The patient's chest pain is not middle- or left-sided, is not well-localized, is not sharp and does not radiate to the arms/jaw/neck. The patient does not complain of nausea and denies diaphoresis. The patient has no history of stroke, has no history of peripheral artery disease, has not smoked in the past 90 days, has no relevant family history of coronary artery disease (first degree  relative at less than age 2) and does not have an elevated BMI (>=30).   Past Medical History:  Diagnosis Date  . Arthritis   . Bilateral pulmonary embolism ([place]) 10/2017   H/O unprovoked PE in 2017/2018 treated with anticoagulation for one year and 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 off anticoagulation  . Bronchitis   . Gout   . H/O colonoscopy with polypectomy   . Hypercholesteremia   . Hypertension   . Renal disorder     Patient Active Problem List   Diagnosis Date Noted  . Seasonal and perennial allergic  rhinitis 05/09/2018  . Positive colorectal cancer screening using Cologuard test 04/12/2018  . Long term current use of anticoagulant therapy 04/12/2018  . Chronic rhinitis 01/12/2018  . Pulmonary embolism ([place]) 10/13/2017  . Hypertension   . Arthritis   . Renal disorder   . H/O colonoscopy with polypectomy     Past Surgical History:  Procedure Laterality Date  . bilateral heel spur removed    . carpel tunnel Bilateral   . CHOLECYSTECTOMY    . COLONOSCOPY  2013   [facility]: hyperplastic polyps. advised 5 year follow up colonoscopy.   . COLONOSCOPY N/A 06/12/2018   Procedure: COLONOSCOPY;  Surgeon: [name], MD;  Location: [facility];  Service: Endoscopy;  Laterality: N/A;  8:30am  . KNEE ARTHROSCOPY Right   . PARTIAL HYSTERECTOMY    . POLYPECTOMY  06/12/2018   Procedure: POLYPECTOMY;  Surgeon: [name], MD;  Location: [facility];  Service: Endoscopy;;  hep.flex  . THYROIDECTOMY, PARTIAL    . TONSILLECTOMY       OB History   No obstetric history on file.     Family History  Problem Relation Age of Onset  . Allergic rhinitis Sister   . Allergic rhinitis Brother   . Hypertension Mother   . Heart attack Father   . Cirrhosis Father   . Emphysema Father   . Breast cancer Maternal Aunt   . Colon cancer Neg Hx     Social History   Tobacco Use  . Smoking status: Never Smoker  . Smokeless tobacco: Never Used  Substance Use Topics  . Alcohol use: Never  . Drug use: Never    Home Medications Prior to Admission medications   Medication Sig Start Date End Date Taking? Authorizing Provider  allopurinol (ZYLOPRIM) 100 MG tablet Take 100 mg by mouth at bedtime.   Yes [provider]  Artificial Tear Solution (SOOTHE XP) SOLN Apply 1-2 drops to eye as needed (dry eyes).    Yes [provider]  azelastine (ASTELIN) 0.1 % nasal spray Place 2 sprays into both nostrils 2 (two) times daily. 07/15/19  Yes [provider]  colchicine-probenecid 0.5-500 MG tablet Take 1 tablet by mouth as directed. 10/03/19  Yes [provider]  cyclobenzaprine (FLEXERIL) 10 MG tablet Take 5 mg by mouth 2 (two) times daily as needed for muscle spasms.  04/04/16  Yes [provider]  ferrous sulfate 325 (65 FE) MG EC tablet Take 1 tablet by mouth daily. 07/11/19 07/10/20 Yes [provider]  furosemide (LASIX) 20 MG tablet Take 20 mg by mouth 2 (two) times daily.    Yes [provider]  omeprazole (PRILOSEC) 40 MG capsule Take 40 mg by mouth daily.   Yes [provider]  potassium chloride SA (KLOR-CON) 20 MEQ tablet Take 1 tablet by mouth daily. 07/11/19 07/10/20 Yes [provider]  rivaroxaban (XARELTO) 20 MG TABS tablet Take 1 tablet (20 mg total) by mouth daily with supper. Start using after you complete the starter pack 10/14/17  Yes Isaac Bliss, Rayford Halsted, MD  sodium chloride (OCEAN) 0.65 % SOLN nasal spray Place 1 spray into both nostrils as needed for congestion.   Yes [provider]  SUMAtriptan (IMITREX) 100 MG tablet Take 1 tablet by mouth daily. Take 1 tab at the first sign of headache 07/07/19  Yes [provider]  vitamin E 400 UNIT capsule Take 400 Units by mouth daily.   Yes [provider]    Allergies    Clonidine derivatives, Etodolac, Hydrocodone, Naproxen, Statins, Tramadol, Amlodipine, Lisinopril, and Losartan  Review of Systems   Review of Systems  Constitutional: Negative for fever.  Respiratory: Positive for shortness of breath. Negative for cough.   Cardiovascular: Positive for chest pain.  Gastrointestinal: Negative for abdominal pain, nausea and vomiting.  Genitourinary: Negative for dysuria and hematuria.  Neurological: Negative for headaches.  All other systems reviewed and are negative.   Physical Exam Updated Vital Signs BP 130/82   Pulse 83   Temp 98 F (36.7 C) (Oral)   Resp 17   Ht 5\' 6"   (1.676 m)   Wt 95.7 kg   SpO2 92%   BMI 34.06 kg/m   Physical Exam Vitals and nursing note reviewed.  Constitutional:      Appearance: Normal appearance. She is well-developed.  HENT:     Head: Normocephalic and atraumatic.  Eyes:     General: Lids are normal.     Conjunctiva/sclera: Conjunctivae normal.     Pupils: Pupils are equal, round, and reactive to light.     Comments: PERRL. EOMs intact. No nystagmus. No neglect.   Cardiovascular:     Rate and Rhythm: Normal rate and regular rhythm.     Pulses: Normal pulses.          Radial pulses are 2+ on the right side and 2+ on the left side.       Dorsalis pedis pulses are 2+ on the right side and 2+ on the left side.     Heart sounds: Normal heart sounds. No murmur. No friction rub. No gallop.   Pulmonary:     Effort: Pulmonary effort is normal.     Breath sounds: Normal breath sounds.     Comments: Lungs clear to auscultation bilaterally.  Symmetric chest rise.  No wheezing, rales, rhonchi. Chest:       Comments: Mild reproduction of pain with palpation of the area.  No deformity or crepitus noted.  She states she feels a pulling sensation when I move her arms but it is not severe as when she first had the pain. Abdominal:     Palpations: Abdomen is soft. Abdomen is not rigid.     Tenderness: There is no abdominal tenderness. There is no guarding.     Comments: Abdomen is soft, non-distended, non-tender. No rigidity, No guarding. No peritoneal signs.  Musculoskeletal:        General: Normal range of motion.     Cervical back: Full passive range of motion without pain.  Skin:    General: Skin is warm and dry.     Capillary Refill: Capillary refill takes less than 2 seconds.  Neurological:     Mental Status: She is alert and oriented to person, place, and time.     Comments: Cranial nerves III-XII intact Follows commands, Moves all extremities  5/5 strength to BUE and BLE  Sensation intact throughout all major nerve  distributions No gait abnormalities  No slurred speech. No facial droop.   Psychiatric:        Speech: Speech normal.     ED Results / Procedures / Treatments   Labs (all labs ordered are listed, but only abnormal results are displayed) Labs Reviewed  COMPREHENSIVE METABOLIC PANEL - Abnormal; Notable for the following components:      Result Value   Potassium 3.3 (*)    Chloride 97 (*)    Glucose, Bld 118 (*)    BUN 25 (*)    Creatinine, Ser 1.30 (*)    GFR calc non Af Amer 41 (*)    GFR calc Af Amer 47 (*)    All other components within normal limits  CBC WITH DIFFERENTIAL/PLATELET  TROPONIN I (HIGH SENSITIVITY)  TROPONIN I (HIGH SENSITIVITY)    EKG EKG Interpretation  Date/Time:  Thursday Oct 31 2019 12:35:02 EDT Ventricular Rate:  81 PR Interval:    QRS Duration: 103 QT Interval:  397 QTC Calculation: 461 R Axis:   -37 Text Interpretation: Sinus rhythm Left axis deviation Low voltage, precordial leads Consider anterior infarct Artifact No significant change since last tracing Confirmed by Fredia Sorrow [phone]) on 10/31/2019 1:07:57 PM   Radiology DG Chest 2 View  Result Date: 10/31/2019 CLINICAL DATA:  Chest pain EXAM: CHEST - 2 VIEW COMPARISON:  2019 FINDINGS: The heart size and mediastinal contours are within normal limits. Both lungs are clear. No acute osseous abnormality. Surgical clips overlie the neck. IMPRESSION: No acute process in the chest. Electronically Signed   By: Macy Mis M.D.   On: 10/31/2019 14:20    Procedures Procedures (including critical care time)  Medications Ordered in ED Medications  morphine 4 MG/ML injection 4 mg (4 mg Intravenous Given 10/31/19 1527)  ondansetron (ZOFRAN) injection 4 mg (4 mg Intravenous Given 10/31/19 1526)    ED Course  I have reviewed the triage vital signs and the nursing notes.  Pertinent labs & imaging results that were available during my care of the patient were reviewed by me and considered in my  medical decision making (see chart for details).    MDM Rules/Calculators/A&P HEAR Score: 8                    74 year old female past medical history of hypertension, hyperlipidemia who presents for evaluation of chest pain that began when she was working in her garden earlier this morning.  Also reports concerned about hypertension.  Her primary care doctor has been working with her for her high blood pressure but has not been able to get it controlled.  She is scheduled to see a cardiologist next week but felt like she needed to be seen sooner.  She states she had been prescribed Klonopin but she did not like the way she made it made her feel so she had stopped it.  She reports that she is currently back on it.  States that the chest pain feels like a tightness almost like a pulled muscle.  It is worse with exertion.  While she was here in the ED, she did feel like it is slightly worse with deep inspiration.  No difficulty breathing otherwise.  She does have a history of PEs but she has been compliant with her Xarelto.  She is hemodynamically stable here in the ED.  Consider ACS etiology versus unstable angina.  Also consider  infectious etiology.  Low suspicion for PE.  History/physical exam not concerning for dissection, CVA. Plan for labs, EKG, CXR.  CMP shows potassium 3.3.  BUN is 25, creatinine is 1.30.  Initial troponin is negative.  CBC shows no leukocytosis or anemia.  Chest x-ray negative for any acute abnormalities.  Given patient's history/risk factors, she has a heart score of 5.  Plan for delta troponin.  Delta troponin negative.  Reevaluation.  Patient reports that chest pain is completely resolved.  At this time, unclear etiology.  She has some atypical features such as pleuritic chest pain as well as pain with palpation and movement of her arm.  Question if this is more musculoskeletal in nature.  I discussed with Dr. Harl Bowie (cardiology) who reviewed patient's EKG and history.   At this time, he feels that patient can follow-up on an outpatient basis.  He will work to get her appointment next week with [facility] cardiology group.  Updated patient on plan.  She is agreeable.  Patient's vitals are stable.  Patient did have an episode where she dropped on 86% on room air.  Patient denies any difficulty breathing.  Patient reports that her O2 levels drop because of her history of PEs.  She denies any symptoms or concerns at this time. At this time, patient exhibits no emergent life-threatening condition that require further evaluation in ED or admission. Patient had ample opportunity for questions and discussion. All patient's questions were answered with full understanding. Strict return precautions discussed. Patient expresses understanding and agreement to plan.   Portions of this note were generated with [employer]. Dictation errors may occur despite best attempts at proofreading.   Final Clinical Impression(s) / ED Diagnoses Final diagnoses:  Nonspecific chest pain  Hypertension, unspecified type    Rx / DC Orders ED Discharge Orders    None       Desma Mcgregor 10/31/19 1833    Daleen Bo, MD 11/01/19 [phone]

## 2019-11-07 ENCOUNTER — Telehealth: Payer: Self-pay | Admitting: Cardiology

## 2019-11-07 ENCOUNTER — Encounter: Payer: Self-pay | Admitting: Cardiology

## 2019-11-07 ENCOUNTER — Ambulatory Visit (INDEPENDENT_AMBULATORY_CARE_PROVIDER_SITE_OTHER): Payer: PPO | Admitting: Cardiology

## 2019-11-07 ENCOUNTER — Other Ambulatory Visit: Payer: Self-pay

## 2019-11-07 VITALS — BP 150/100 | HR 77 | Ht 66.0 in | Wt 212.0 lb

## 2019-11-07 DIAGNOSIS — I129 Hypertensive chronic kidney disease with stage 1 through stage 4 chronic kidney disease, or unspecified chronic kidney disease: Secondary | ICD-10-CM | POA: Diagnosis not present

## 2019-11-07 DIAGNOSIS — R0789 Other chest pain: Secondary | ICD-10-CM | POA: Diagnosis not present

## 2019-11-07 DIAGNOSIS — N189 Chronic kidney disease, unspecified: Secondary | ICD-10-CM | POA: Diagnosis not present

## 2019-11-07 DIAGNOSIS — E876 Hypokalemia: Secondary | ICD-10-CM | POA: Diagnosis not present

## 2019-11-07 DIAGNOSIS — I10 Essential (primary) hypertension: Secondary | ICD-10-CM

## 2019-11-07 DIAGNOSIS — R809 Proteinuria, unspecified: Secondary | ICD-10-CM | POA: Diagnosis not present

## 2019-11-07 DIAGNOSIS — N170 Acute kidney failure with tubular necrosis: Secondary | ICD-10-CM | POA: Diagnosis not present

## 2019-11-07 DIAGNOSIS — E1122 Type 2 diabetes mellitus with diabetic chronic kidney disease: Secondary | ICD-10-CM | POA: Diagnosis not present

## 2019-11-07 MED ORDER — FUROSEMIDE 20 MG PO TABS
20.0000 mg | ORAL_TABLET | Freq: Every day | ORAL | 1 refills | Status: DC | PRN
Start: 2019-11-07 — End: 2020-08-03

## 2019-11-07 MED ORDER — CHLORTHALIDONE 25 MG PO TABS
25.0000 mg | ORAL_TABLET | Freq: Every day | ORAL | 1 refills | Status: DC
Start: 2019-11-07 — End: 2020-03-30

## 2019-11-07 NOTE — Addendum Note (Signed)
Addended by: Julian Hy T on: 11/07/2019 08:52 AM   Modules accepted: Orders

## 2019-11-07 NOTE — Patient Instructions (Signed)
Your physician recommends that you schedule a follow-up appointment in: [place] has recommended you make the following change in your medication:   START CHLORTHALIDONE 25 MG DAILY   TAKE LASIX 20 MG AS NEEDED  BLOOD PRESSURE LOG FOR 2 WEEKS AND CALL us WITH READINGS - CHECK BLOOD PRESSURE AT THE SAME TIME DAILY AFTER SITTING FOR 10-15 MINUTES AND AT LEAST 1 HOUR AFTER TAKING YOUR MEDICATIONS.  Thank you for choosing [facility]!!

## 2019-11-07 NOTE — Telephone Encounter (Signed)
  Patient Consent for Virtual Visit         Lauren Hamilton has provided verbal consent on 11/07/2019 for a virtual visit (video or telephone).   CONSENT FOR VIRTUAL VISIT FOR:  Lauren Hamilton  By participating in this virtual visit I agree to the following:  I hereby voluntarily request, consent and authorize [facility] and its employed or contracted physicians, physician assistants, nurse practitioners or other licensed health care professionals (the Practitioner), to provide me with telemedicine health care services (the "Services") as deemed necessary by the treating Practitioner. I acknowledge and consent to receive the Services by the Practitioner via telemedicine. I understand that the telemedicine visit will involve communicating with the Practitioner through live audiovisual communication technology and the disclosure of certain medical information by electronic transmission. I acknowledge that I have been given the opportunity to request an in-person assessment or other available alternative prior to the telemedicine visit and am voluntarily participating in the telemedicine visit.  I understand that I have the right to withhold or withdraw my consent to the use of telemedicine in the course of my care at any time, without affecting my right to future care or treatment, and that the Practitioner or I may terminate the telemedicine visit at any time. I understand that I have the right to inspect all information obtained and/or recorded in the course of the telemedicine visit and may receive copies of available information for a reasonable fee.  I understand that some of the potential risks of receiving the Services via telemedicine include:  [name] Delay or interruption in medical evaluation due to technological equipment failure or disruption; . Information transmitted may not be sufficient (e.g. poor resolution of images) to allow for appropriate medical decision making by the  Practitioner; and/or  . In rare instances, security protocols could fail, causing a breach of personal health information.  Furthermore, I acknowledge that it is my responsibility to provide information about my medical history, conditions and care that is complete and accurate to the best of my ability. I acknowledge that Practitioner's advice, recommendations, and/or decision may be based on factors not within their control, such as incomplete or inaccurate data provided by me or distortions of diagnostic images or specimens that may result from electronic transmissions. I understand that the practice of medicine is not an exact science and that Practitioner makes no warranties or guarantees regarding treatment outcomes. I acknowledge that a copy of this consent can be made available to me via my patient portal ([facility]), or I can request a printed copy by calling the office of [name].    I understand that my insurance will be billed for this visit.   I have read or had this consent read to me. . I understand the contents of this consent, which adequately explains the benefits and risks of the Services being provided via telemedicine.  . I have been provided ample opportunity to ask questions regarding this consent and the Services and have had my questions answered to my satisfaction. . I give my informed consent for the services to be provided through the use of telemedicine in my medical care

## 2019-11-07 NOTE — Progress Notes (Signed)
Clinical Summary Lauren Hamilton is a 74 y.o.female seen as new patient for the following medical problems.   1. Chest pain - ER visit 10/2019 with chest pain - troponins negative, no acute  EKG changes. CXR no acute process  - reports chest pain that started that morning. Pressure left sided, 7/10 in severity. No other associated symptoms. Worst with deep breathing. Constant pain x 72 hours.  - she thinks it was related to a medication, she stopped taking clonidine and pain stopped.  -restarted clonidine few days later, pain returned.    2. HTN - allergies clonidine, norvasc, lisinopril, losartan.  - issues with gout   3. History of PE - on xarelto   Past Medical History:  Diagnosis Date  . Arthritis   . 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 Bronchitis   . Gout   . H/O colonoscopy with polypectomy   . Hypercholesteremia   . Hypertension   . Renal disorder      Allergies  Allergen Reactions  . Clonidine Derivatives     Dizzy, dry mouth  . Etodolac     Unknown reaction  . Hydrocodone Other (See Comments)    Altered mental status-Hallucination, tolerated in low doses   . Naproxen Other (See Comments)    Damage to kidney   . Statins Other (See Comments)    Unknown reaction, tolerates lovastatin   . Tramadol     Patient, "Felt funny, didn't feel right, and felt weird."  . Amlodipine Cough  . Lisinopril Cough  . Losartan Cough     Current Outpatient Medications  Medication Sig Dispense Refill  . allopurinol (ZYLOPRIM) 100 MG tablet Take 100 mg by mouth at bedtime.    . Artificial Tear Solution (SOOTHE XP) SOLN Apply 1-2 drops to eye as needed (dry eyes).     [name] azelastine (ASTELIN) 0.1 % nasal spray Place 2 sprays into both nostrils 2 (two) times daily.    . colchicine-probenecid 0.5-500 MG tablet Take 1 tablet by  mouth as directed.    . cyclobenzaprine (FLEXERIL) 10 MG tablet Take 5 mg by mouth 2 (two) times daily as needed for muscle spasms.     . ferrous sulfate 325 (65 FE) MG EC tablet Take 1 tablet by mouth daily.    . furosemide (LASIX) 20 MG tablet Take 20 mg by mouth 2 (two) times daily.     [name] omeprazole (PRILOSEC) 40 MG capsule Take 40 mg by mouth daily.    . potassium chloride SA (KLOR-CON) 20 MEQ tablet Take 1 tablet by mouth daily.    . rivaroxaban (XARELTO) 20 MG TABS tablet Take 1 tablet (20 mg total) by mouth daily with supper. Start using after you complete the starter pack 30 tablet 3  . sodium chloride (OCEAN) 0.65 % SOLN nasal spray Place 1 spray into both nostrils as needed for congestion.    . SUMAtriptan (IMITREX) 100 MG tablet Take 1 tablet by mouth daily. Take 1 tab at the first sign of headache    . vitamin E 400 UNIT capsule Take 400 Units by mouth daily.     No current facility-administered medications for this visit.     Past Surgical History:  Procedure Laterality Date  . bilateral heel spur removed    . carpel tunnel Bilateral   . CHOLECYSTECTOMY    .  COLONOSCOPY  2013   [facility]: hyperplastic polyps. advised 5 year follow up colonoscopy.   . COLONOSCOPY N/A 06/12/2018   Procedure: COLONOSCOPY;  Surgeon: [name], MD;  [facility];  Service: Endoscopy;  Laterality: N/A;  8:30am  . KNEE ARTHROSCOPY Right   . PARTIAL HYSTERECTOMY    . POLYPECTOMY  06/12/2018   Procedure: POLYPECTOMY;  Surgeon: [name], MD;  Location: [facility];  Service: Endoscopy;;  hep.flex  . THYROIDECTOMY, PARTIAL    . TONSILLECTOMY       Allergies  Allergen Reactions  . Clonidine Derivatives     Dizzy, dry mouth  . Etodolac     Unknown reaction  . Hydrocodone Other (See Comments)    Altered mental status-Hallucination, tolerated in low doses   . Naproxen Other (See Comments)    Damage to kidney   . Statins Other (See Comments)      Unknown reaction, tolerates lovastatin   . Tramadol     Patient, "Felt funny, didn't feel right, and felt weird."  . Amlodipine Cough  . Lisinopril Cough  . Losartan Cough      Family History  Problem Relation Age of Onset  . Allergic rhinitis Sister   . Allergic rhinitis Brother   . Hypertension Mother   . Heart attack Father   . Cirrhosis Father   . Emphysema Father   . Breast cancer Maternal Aunt   . Colon cancer Neg Hx      Social History Lauren Hamilton reports that she has never smoked. She has never used smokeless tobacco. Lauren Hamilton reports no history of alcohol use.   Review of Systems CONSTITUTIONAL: No weight loss, fever, chills, weakness or fatigue.  HEENT: Eyes: No visual loss, blurred vision, double vision or yellow sclerae.No hearing loss, sneezing, congestion, runny nose or sore throat.  SKIN: No rash or itching.  CARDIOVASCULAR: per hpi RESPIRATORY: No shortness of breath, cough or sputum.  GASTROINTESTINAL: No anorexia, nausea, vomiting or diarrhea. No abdominal pain or blood.  GENITOURINARY: No burning on urination, no polyuria NEUROLOGICAL: No headache, dizziness, syncope, paralysis, ataxia, numbness or tingling in the extremities. No change in bowel or bladder control.  MUSCULOSKELETAL: No muscle, back pain, joint pain or stiffness.  LYMPHATICS: No enlarged nodes. No history of splenectomy.  PSYCHIATRIC: No history of depression or anxiety.  ENDOCRINOLOGIC: No reports of sweating, cold or heat intolerance. No polyuria or polydipsia.  [name]   Physical Examination Vitals:   11/07/19 0813  BP: (!) 150/100  Pulse: 77  SpO2: 98%   Filed Weights   11/07/19 0813  Weight: 212 lb (96.2 kg)    Gen: resting comfortably, no acute distress HEENT: no scleral icterus, pupils equal round and reactive, no palptable cervical adenopathy,  CV Resp: Clear to auscultation bilaterally GI: abdomen is soft, non-tender, non-distended, normal bowel sounds, no  hepatosplenomegaly MSK: extremities are warm, no edema.  Skin: warm, no rash Neuro:  no focal deficits Psych: appropriate affect   Diagnostic Studies     Assessment and Plan  1. Chest pain - noncardiac, no further workup at this time  2. HTN - multiple allergies listed to bp meds - will try changing her lasix to chlorthalidone 25mg  daily, change her lasix to just prn. Has some renal dysfunction and also prior gout but has tolerated lasix, chlorthalidone would have more potent bp effect. If not able to tolerate would consider hydralazine. Other options down the road would be potentially  a beta blocker such as coreg or labetalol or aldactone pending her renal function.  bp log 2 weeks, f/u virtual 1 month       Arnoldo Lenis, M.D.

## 2019-11-13 DIAGNOSIS — E876 Hypokalemia: Secondary | ICD-10-CM | POA: Diagnosis not present

## 2019-11-13 DIAGNOSIS — E1122 Type 2 diabetes mellitus with diabetic chronic kidney disease: Secondary | ICD-10-CM | POA: Diagnosis not present

## 2019-11-13 DIAGNOSIS — R600 Localized edema: Secondary | ICD-10-CM | POA: Diagnosis not present

## 2019-11-13 DIAGNOSIS — R809 Proteinuria, unspecified: Secondary | ICD-10-CM | POA: Diagnosis not present

## 2019-11-13 DIAGNOSIS — I129 Hypertensive chronic kidney disease with stage 1 through stage 4 chronic kidney disease, or unspecified chronic kidney disease: Secondary | ICD-10-CM | POA: Diagnosis not present

## 2019-11-13 DIAGNOSIS — N189 Chronic kidney disease, unspecified: Secondary | ICD-10-CM | POA: Diagnosis not present

## 2019-11-21 ENCOUNTER — Telehealth: Payer: Self-pay | Admitting: Cardiology

## 2019-11-21 NOTE — Telephone Encounter (Signed)
Calling to give BP readings for Dr. Harl Bowie   [phone]

## 2019-11-21 NOTE — Telephone Encounter (Signed)
Pt reporting BP after LOV and starting chlorthalidone 25 mg daily - says she is still having chest pain 3/10 c/o dizziness/nausea/ - says symptoms have not worsened but have not gotten any better - says she is coughing a lot more since starting chlorthalidone   5/28 BP 148/92  5/29 BP 167/89 5/30 BP 201/91  5/31 BP 172/101 6/1 BP 165/98 6/2 BP 154/85 6/3 BP 136/85 6/4 BP 154/94  6/5 BP 155/98 6/6 BP 154/96 6/7 BP 157/97 6/8 BP 163/93 6/9 BP 142/84 6/10 BP 147/87

## 2019-11-22 MED ORDER — HYDRALAZINE HCL 25 MG PO TABS
25.0000 mg | ORAL_TABLET | Freq: Two times a day (BID) | ORAL | 3 refills | Status: DC
Start: 2019-11-22 — End: 2019-11-29

## 2019-11-22 NOTE — Telephone Encounter (Signed)
BP's still too high, can she start hydralazine 25mg  bid. Chest pain was not cardiac by description at our last visit, if unchanged would not change our plans at this time. Update [place] on bp's next Stormy Card MD

## 2019-11-22 NOTE — Telephone Encounter (Signed)
Pt voiced understanding - hydralazine sent to [facility] - pt will update [place] 1 weeks after starting medications on BP

## 2019-11-27 DIAGNOSIS — M170 Bilateral primary osteoarthritis of knee: Secondary | ICD-10-CM | POA: Diagnosis not present

## 2019-11-29 ENCOUNTER — Encounter: Payer: Self-pay | Admitting: Cardiology

## 2019-11-29 ENCOUNTER — Telehealth (INDEPENDENT_AMBULATORY_CARE_PROVIDER_SITE_OTHER): Payer: PPO | Admitting: Cardiology

## 2019-11-29 ENCOUNTER — Other Ambulatory Visit: Payer: Self-pay

## 2019-11-29 VITALS — BP 156/91 | HR 98 | Ht 66.0 in | Wt 212.0 lb

## 2019-11-29 DIAGNOSIS — I10 Essential (primary) hypertension: Secondary | ICD-10-CM

## 2019-11-29 DIAGNOSIS — R0789 Other chest pain: Secondary | ICD-10-CM | POA: Diagnosis not present

## 2019-11-29 MED ORDER — HYDRALAZINE HCL 25 MG PO TABS: 37.5000 mg | ORAL_TABLET | Freq: Three times a day (TID) | ORAL | 3 refills | Status: DC

## 2019-11-29 NOTE — Patient Instructions (Signed)
Your physician recommends that you schedule a follow-up appointment in: [place] has recommended you make the following change in your medication:   INCREASE HYDRALAZINE 37.5 MG Delta   Your physician has requested that you regularly monitor and record your blood pressure readings at home. Please use the same machine at the same time of day to check your readings and record them - BLOOD PRESSURE LOG FOR 1 WEEK AND CALL us WITH READINGS   Thank you for choosing [facility]!!

## 2019-11-29 NOTE — Addendum Note (Signed)
Addended by: Julian Hy T on: 11/29/2019 08:50 AM   Modules accepted: Orders

## 2019-11-29 NOTE — Progress Notes (Signed)
Virtual Visit via Telephone Note   This visit type was conducted due to national recommendations for restrictions regarding the COVID-19 Pandemic (e.g. social distancing) in an effort to limit this patient's exposure and mitigate transmission in our community.  Due to her co-morbid illnesses, this patient is at least at moderate risk for complications without adequate follow up.  This format is felt to be most appropriate for this patient at this time.  The patient did not have access to video technology/had technical difficulties with video requiring transitioning to audio format only (telephone).  All issues noted in this document were discussed and addressed.  No physical exam could be performed with this format.  Please refer to the patient's chart for her  consent to telehealth for [facility].   The patient was identified using 2 identifiers.  Date:  11/29/2019   ID:  Lauren Hamilton, DOB Jun 27, 1945, MRN [id number]  Patient Location: Home Provider Location: Home  PCP:  Arsenio Katz, NP  Cardiologist:  Carlyle Dolly, MD  Electrophysiologist:  None   Evaluation Performed:  Follow-Up Visit  Chief Complaint:  Follow up  History of Present Illness:    Lauren Hamilton is a 74 y.o. female seen today for follow up of the following medical problems.    1. Chest pain - ER visit 10/2019 with chest pain - troponins negative, no acute  EKG changes. CXR no acute process  - reports chest pain that started that morning. Pressure left sided, 7/10 in severity. No other associated symptoms. Worst with deep breathing. Constant pain x 72 hours.  - she thinks it was related to a medication, she stopped taking clonidine and pain stopped.  -restarted clonidine few days later, pain returned.   - chest pain since last visit, less severe.   2. HTN - allergies clonidine, norvasc, lisinopril, losartan.  - issues with gout  - changed to chlorathalidone, bp's remained elevated by phone  call and hydralazine was started - some mild nausea she reports is getting better, started around time of starting chlorthalidone. Says she takes on an empty stomach.   - home bp's 150s/90s    3. History of PE - on xarelto  The patient does not have symptoms concerning for COVID-19 infection (fever, chills, cough, or new shortness of breath).    Past Medical History:  Diagnosis Date  . Arthritis   . 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 Bronchitis   . Gout   . H/O colonoscopy with polypectomy   . Hypercholesteremia   . Hypertension   . Renal disorder    Past Surgical History:  Procedure Laterality Date  . bilateral heel spur removed    . carpel tunnel Bilateral   . CHOLECYSTECTOMY    . COLONOSCOPY  2013   [facility]: hyperplastic polyps. advised 5 year follow up colonoscopy.   . COLONOSCOPY N/A 06/12/2018   Procedure: COLONOSCOPY;  Surgeon: [name], MD;  [facility];  Service: Endoscopy;  Laterality: N/A;  8:30am  . KNEE ARTHROSCOPY Right   . PARTIAL HYSTERECTOMY    . POLYPECTOMY  06/12/2018   Procedure: POLYPECTOMY;  Surgeon: [name], MD;  [facility];  Service: Endoscopy;;  hep.flex  . THYROIDECTOMY, PARTIAL    . TONSILLECTOMY       No outpatient medications have been marked  as taking for the 11/29/19 encounter (Appointment) with Arnoldo Lenis, MD.     Allergies:   Clonidine derivatives, Etodolac, Hydrocodone, Naproxen, Statins, Tramadol, Amlodipine, Lisinopril, and Losartan   Social History   Tobacco Use  . Smoking status: Never Smoker  . Smokeless tobacco: Never Used  Vaping Use  . Vaping Use: Never used  Substance Use Topics  . Alcohol use: Never  . Drug use: Never     Family Hx: The patient's family history includes Allergic rhinitis in  her brother and sister; Breast cancer in her maternal aunt; Cirrhosis in her father; Emphysema in her father; Heart attack in her father; Hypertension in her mother. There is no history of Colon cancer.  ROS:   Please see the history of present illness.     All other systems reviewed and are negative.   Prior CV studies:   The following studies were reviewed today:    Labs/Other Tests and Data Reviewed:    EKG:  No ECG reviewed.  Recent Labs: 10/31/2019: ALT 27; BUN 25; Creatinine, Ser 1.30; Hemoglobin 13.7; Platelets 226; Potassium 3.3; Sodium 137   Recent Lipid Panel No results found for: CHOL, TRIG, HDL, CHOLHDL, LDLCALC, LDLDIRECT  Wt Readings from Last 3 Encounters:  11/07/19 212 lb (96.2 kg)  10/31/19 211 lb (95.7 kg)  06/12/18 206 lb (93.4 kg)     Objective:    Vital Signs:   Today's Vitals   11/29/19 0819  BP: (!) 156/91  Pulse: 98  Weight: 212 lb (96.2 kg)  Height: 5\' 6"  (1.676 m)   Body mass index is 34.22 kg/m. Normal affect. Normal speech pattern and tone. Comfortable, no apparnet distress. No audible signs of sob orwheezing.   ASSESSMENT & PLAN:    1. Chest pain -atypical symptoms, monitor at this time. If change or progression may consider ischemic testing.   2. HTN - multiple allergies listed to bp meds - ongoing high bp's, increase hydralazine to 37.5mg  tid Other options down the road would be potentially a beta blocker such as coreg or labetalol or aldactone pending her renal function.  Bp log 1 week, virtual visit 3 weeks  COVID-19 Education: The signs and symptoms of COVID-19 were discussed with the patient and how to seek care for testing (follow up with PCP or arrange E-visit).  The importance of social distancing was discussed today.  Time:   Today, I have spent 17 minutes with the patient with telehealth technology discussing the above problems.     Medication Adjustments/Labs and Tests Ordered: Current medicines are reviewed at  length with the patient today.  Concerns regarding medicines are outlined above.   Tests Ordered: No orders of the defined types were placed in this encounter.   Medication Changes: No orders of the defined types were placed in this encounter.   Follow Up:  Virtual Visit  in 3 week(s)  Signed, Carlyle Dolly, MD  11/29/2019 7:40 AM    [facility]

## 2019-12-05 ENCOUNTER — Telehealth: Payer: Self-pay | Admitting: Cardiology

## 2019-12-05 DIAGNOSIS — I10 Essential (primary) hypertension: Secondary | ICD-10-CM

## 2019-12-05 DIAGNOSIS — R0789 Other chest pain: Secondary | ICD-10-CM

## 2019-12-05 MED ORDER — HYDRALAZINE HCL 50 MG PO TABS
50.0000 mg | ORAL_TABLET | Freq: Three times a day (TID) | ORAL | 1 refills | Status: DC
Start: 2019-12-05 — End: 2019-12-25

## 2019-12-05 NOTE — Telephone Encounter (Signed)
Pt voiced understanding - orders placed for lexiscan and forward to schedulers - Medication sent to pharmacy.

## 2019-12-05 NOTE — Telephone Encounter (Signed)
Increase hydralazine to 50mg  tid. Can we order a lexiscan for chest pain, symptoms have been ongoing and not resolved and its time to look closer at her heart as a possible cause  Zandra Abts MD

## 2019-12-05 NOTE — Telephone Encounter (Signed)
06/19 141/78 06/20 151/84 06/21 162/86 06/22 missed taking 06/23 119/67 06/24 137/99  Patient also wanted to report to Dr. Harl Bowie that she continues to have chest pain rated  5-7/10. Reports some days the chest pain is worse than others.

## 2019-12-05 NOTE — Telephone Encounter (Signed)
Patient called to give her BP readings for the week as Dr Harl Bowie requested she do. If someone can call her back to get these readings that would be great.

## 2019-12-06 ENCOUNTER — Telehealth: Payer: Self-pay | Admitting: Cardiology

## 2019-12-06 NOTE — Telephone Encounter (Signed)
Pre-cert Verification for the following procedure   LEXISCAN    DATE:   12/12/2019  LOCATION: [facility]

## 2019-12-10 ENCOUNTER — Telehealth: Payer: PPO | Admitting: Cardiology

## 2019-12-12 ENCOUNTER — Encounter (HOSPITAL_BASED_OUTPATIENT_CLINIC_OR_DEPARTMENT_OTHER)
Admission: RE | Admit: 2019-12-12 | Discharge: 2019-12-12 | Disposition: A | Discharge status: Home / Self Care | Payer: PPO | Source: Ambulatory Visit | Attending: Cardiology | Admitting: Cardiology

## 2019-12-12 ENCOUNTER — Encounter (HOSPITAL_COMMUNITY)
Admission: RE | Admit: 2019-12-12 | Discharge: 2019-12-12 | Disposition: A | Discharge status: Home / Self Care | Payer: PPO | Source: Ambulatory Visit | Attending: Cardiology | Admitting: Cardiology

## 2019-12-12 ENCOUNTER — Encounter (HOSPITAL_COMMUNITY): Payer: Self-pay

## 2019-12-12 ENCOUNTER — Other Ambulatory Visit: Payer: Self-pay

## 2019-12-12 DIAGNOSIS — R0789 Other chest pain: Secondary | ICD-10-CM | POA: Diagnosis not present

## 2019-12-12 HISTORY — DX: Disorder of kidney and ureter, unspecified: N28.9

## 2019-12-12 HISTORY — DX: Type 2 diabetes mellitus without complications: E11.9

## 2019-12-12 HISTORY — DX: Malignant (primary) neoplasm, unspecified: C80.1

## 2019-12-12 LAB — NM MYOCAR MULTI W/SPECT W/WALL MOTION / EF
LV dias vol: 54 mL (ref 46–106)
LV sys vol: 22 mL
Peak HR: 99 {beats}/min
RATE: 0.6
Rest HR: 78 {beats}/min
SDS: 9
SRS: 5
SSS: 14
TID: 1

## 2019-12-12 MED ORDER — SODIUM CHLORIDE FLUSH 0.9 % IV SOLN
INTRAVENOUS | Status: AC
  Administered 2019-12-12: 10 mL via INTRAVENOUS
  Filled 2019-12-12: qty 10

## 2019-12-12 MED ORDER — TECHNETIUM TC 99M TETROFOSMIN IV KIT
30.0000 | PACK | Freq: Once | INTRAVENOUS | Status: AC | PRN
  Administered 2019-12-12: 32.1 via INTRAVENOUS

## 2019-12-12 MED ORDER — TECHNETIUM TC 99M TETROFOSMIN IV KIT
10.0000 | PACK | Freq: Once | INTRAVENOUS | Status: AC | PRN
  Administered 2019-12-12: 11 via INTRAVENOUS

## 2019-12-12 MED ORDER — REGADENOSON 0.4 MG/5ML IV SOLN
INTRAVENOUS | Status: AC
  Administered 2019-12-12: 0.4 mg via INTRAVENOUS
  Filled 2019-12-12: qty 5

## 2019-12-18 ENCOUNTER — Telehealth: Payer: Self-pay | Admitting: Cardiology

## 2019-12-18 NOTE — Telephone Encounter (Signed)
Patient called requesting test results on recent stress test.

## 2019-12-18 NOTE — Telephone Encounter (Signed)
Pt aware - routed to pcp   Normal stress test, no signs of any blockages in her heart   Zandra Abts MD

## 2019-12-19 DIAGNOSIS — E119 Type 2 diabetes mellitus without complications: Secondary | ICD-10-CM | POA: Diagnosis not present

## 2019-12-19 DIAGNOSIS — Z961 Presence of intraocular lens: Secondary | ICD-10-CM | POA: Diagnosis not present

## 2019-12-19 DIAGNOSIS — H40053 Ocular hypertension, bilateral: Secondary | ICD-10-CM | POA: Diagnosis not present

## 2019-12-25 ENCOUNTER — Other Ambulatory Visit: Payer: Self-pay

## 2019-12-25 ENCOUNTER — Telehealth (INDEPENDENT_AMBULATORY_CARE_PROVIDER_SITE_OTHER): Payer: PPO | Admitting: Cardiology

## 2019-12-25 VITALS — BP 139/84 | HR 95 | Ht 66.0 in | Wt 212.0 lb

## 2019-12-25 DIAGNOSIS — R0789 Other chest pain: Secondary | ICD-10-CM

## 2019-12-25 DIAGNOSIS — I10 Essential (primary) hypertension: Secondary | ICD-10-CM

## 2019-12-25 MED ORDER — HYDRALAZINE HCL 50 MG PO TABS: 75.0000 mg | ORAL_TABLET | Freq: Three times a day (TID) | ORAL | 1 refills | Status: DC

## 2019-12-25 NOTE — Patient Instructions (Signed)
Medication Instructions:    START TAKING  HYDRALAZINE  75 MG THREE TIMES A DAY   *If you need a refill on your cardiac medications before your next appointment, please call your pharmacy*   Lab Work: ON DAY OF FOLLOW UP APPOINTMENT GET LABS  BMET MAG AND TSH     If you have labs (blood work) drawn today and your tests are completely normal, you will receive your results only by: [name] MyChart Message (if you have MyChart) OR . A paper copy in the mail If you have any lab test that is abnormal or we need to change your treatment, we will call you to review the results.   Testing/Procedures: NONE ORDERED  TODAY    Follow-Up: At [facility], you and your health needs are our priority.  As part of our continuing mission to provide you with exceptional heart care, we have created designated Provider Care Teams.  These Care Teams include your primary Cardiologist (physician) and Advanced Practice Providers (APPs -  Physician Assistants and Nurse Practitioners) who all work together to provide you with the care you need, when you need it.  We recommend signing up for the patient portal called "MyChart".  Sign up information is provided on this After Visit Summary.  MyChart is used to connect with patients for Virtual Visits (Telemedicine).  Patients are able to view lab/test results, encounter notes, upcoming appointments, etc.  Non-urgent messages can be sent to your provider as well.   To learn more about what you can do with MyChart, go to [email or web address].    Your next appointment:   3 month(s)  The format for your next appointment:   In Person  Provider:   You may see Carlyle Dolly, MD or one of the following Advanced Practice Providers on your designated Care Team:    Bernerd Pho, PA-C   Ermalinda Barrios, [place]     Other Instructions

## 2019-12-25 NOTE — Progress Notes (Signed)
Virtual Visit via Telephone Note   This visit type was conducted due to national recommendations for restrictions regarding the COVID-19 Pandemic (e.g. social distancing) in an effort to limit this patient's exposure and mitigate transmission in our community.  Due to her co-morbid illnesses, this patient is at least at moderate risk for complications without adequate follow up.  This format is felt to be most appropriate for this patient at this time.  The patient did not have access to video technology/had technical difficulties with video requiring transitioning to audio format only (telephone).  All issues noted in this document were discussed and addressed.  No physical exam could be performed with this format.  Please refer to the patient's chart for her  consent to telehealth for [facility].   The patient was identified using 2 identifiers.  Date:  12/25/2019   ID:  Lauren Hamilton, DOB 01-02-1946, MRN [id number]  Patient Location: Home Provider Location: Office/Clinic  PCP:  Arsenio Katz, NP  Cardiologist:  Carlyle Dolly, MD  Electrophysiologist:  None   Evaluation Performed:  Follow-Up Visit  Chief Complaint:  Follow up  History of Present Illness:    Lauren Hamilton is a 74 y.o. female seen today for follow up of the following medical problems.    1. Chest pain - ER visit 10/2019 with chest pain - troponins negative, no acute EKG changes. CXR no acute process  - reports chest pain that started that morning. Pressure left sided, 7/10 in severity. No other associated symptoms. Worst with deep breathing. Constant pain x 72 hours.  - she thinks it was related to a medication, she stopped taking clonidine and pain stopped.  -restarted clonidine few days later, pain returned.  - chest pain since last visit, less severe.   12/2019 nuclear stress test: no ischemia - ongoing chest pains at times, uncange  2. HTN - allergies clonidine, norvasc, lisinopril,  losartan.  - issues with gout  - changed to chlorathalidone, bp's remained elevated by phone call and hydralazine was started - some mild nausea she reports is getting better, started around time of starting chlorthalidone. Says she takes on an empty stomach.   - home bp's 130s/80s since med changes    3. History of PE - on xarelto   The patient does not have symptoms concerning for COVID-19 infection (fever, chills, cough, or new shortness of breath).    Past Medical History:  Diagnosis Date  . Arthritis   . 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 Bronchitis   . Cancer (HCC)    Skin  . Diabetes mellitus without complication ([place])   . Gout   . H/O colonoscopy with polypectomy   . Hypercholesteremia   . Hypertension   . Renal disorder   . Renal insufficiency    Past Surgical History:  Procedure Laterality Date  . bilateral heel spur removed    . carpel tunnel Bilateral   . CHOLECYSTECTOMY    . COLONOSCOPY  2013   [facility]: hyperplastic polyps. advised 5 year follow up colonoscopy.   . COLONOSCOPY N/A 06/12/2018   Procedure: COLONOSCOPY;  Surgeon: [name], MD;  [facility];  Service: Endoscopy;  Laterality: N/A;  8:30am  . KNEE ARTHROSCOPY Right   . PARTIAL HYSTERECTOMY    . POLYPECTOMY  06/12/2018   Procedure: POLYPECTOMY;  Surgeon: [name], MD;  Location: [facility];  Service: Endoscopy;;  hep.flex  . THYROIDECTOMY, PARTIAL    . TONSILLECTOMY       Current Meds  Medication Sig  . allopurinol (ZYLOPRIM) 100 MG tablet Take 100 mg by mouth at bedtime.  . Artificial Tear Solution (SOOTHE XP) SOLN Apply 1-2 drops to eye as needed (dry eyes).   [name] azelastine (ASTELIN) 0.1 % nasal spray Place 2 sprays into both nostrils 2 (two) times daily.  . chlorthalidone  (HYGROTON) 25 MG tablet Take 1 tablet (25 mg total) by mouth daily.  . colchicine-probenecid 0.5-500 MG tablet Take 1 tablet by mouth as directed.  . cyclobenzaprine (FLEXERIL) 10 MG tablet Take 5 mg by mouth 2 (two) times daily as needed for muscle spasms.   . ferrous sulfate 325 (65 FE) MG EC tablet Take 1 tablet by mouth daily.  . furosemide (LASIX) 20 MG tablet Take 1 tablet (20 mg total) by mouth daily as needed.  . hydrALAZINE (APRESOLINE) 50 MG tablet Take 1 tablet (50 mg total) by mouth 3 (three) times daily.  [name] omeprazole (PRILOSEC) 40 MG capsule Take 40 mg by mouth daily.  . potassium chloride SA (KLOR-CON) 20 MEQ tablet Take 1 tablet by mouth daily.  . rivaroxaban (XARELTO) 20 MG TABS tablet Take 1 tablet (20 mg total) by mouth daily with supper. Start using after you complete the starter pack  . sodium chloride (OCEAN) 0.65 % SOLN nasal spray Place 1 spray into both nostrils as needed for congestion.  . SUMAtriptan (IMITREX) 100 MG tablet Take 1 tablet by mouth daily. Take 1 tab at the first sign of headache  . vitamin E 400 UNIT capsule Take 400 Units by mouth daily.     Allergies:   Clonidine derivatives, Etodolac, Hydrocodone, Naproxen, Statins, Tramadol, Amlodipine, Lisinopril, and Losartan   Social History   Tobacco Use  . Smoking status: Never Smoker  . Smokeless tobacco: Never Used  Vaping Use  . Vaping Use: Never used  Substance Use Topics  . Alcohol use: Never  . Drug use: Never     Family Hx: The patient's family history includes Allergic rhinitis in her brother and sister; Breast cancer in her maternal aunt; Cirrhosis in her father; Emphysema in her father; Heart attack in her father; Hypertension in her mother. There is no history of Colon cancer.  ROS:   Please see the history of present illness.     All other systems reviewed and are negative.   Prior CV studies:   The following studies were reviewed today:  12/2019 nuclear stress  There was no ST  segment deviation noted during stress.  The study is normal. There are no perfusion defects consistent with prior infarct or current ischemia.  This is a low risk study.  The left ventricular ejection fraction is normal (55-65%).  Labs/Other Tests and Data Reviewed:    EKG:  No ECG reviewed.  Recent Labs: 10/31/2019: ALT 27; BUN 25; Creatinine, Ser 1.30; Hemoglobin 13.7; Platelets 226; Potassium 3.3; Sodium 137   Recent Lipid Panel No results found for: CHOL, TRIG, HDL, CHOLHDL, LDLCALC, LDLDIRECT  Wt Readings from Last 3 Encounters:  12/25/19 212 lb (96.2 kg)  11/29/19 212 lb (96.2 kg)  11/07/19 212 lb (96.2 kg)     Objective:    Vital Signs:  BP 139/84   Pulse 95   Ht 5\' 6"  (1.676 m)   Wt 212 lb (96.2 kg)   BMI 34.22  kg/m    Normal affect. Normal speech pattern and tone. Comfortable, no apparent distress. No audible signs of sob or wheezing.   ASSESSMENT & PLAN:    1. Chest pain - fairly atypicla symptoms, nuclear stress test without signs of ischemia - no further cardiac workup, defer any noncardiac chest pain evaluation to pc  2. HTN - bp's improving but still not at goal, will increase hydarlazine to 75mg  tid - if additional agents needed consider beta blocker or aldactone - repeat labs, had some low K during her ER visit in May, she is on chlorthalidone.   3. Nonspecific fatigue - add TSH, CBC to labs  COVID-19 Education: The signs and symptoms of COVID-19 were discussed with the patient and how to seek care for testing (follow up with PCP or arrange E-visit).  The importance of social distancing was discussed today.  Time:   Today, I have spent 13 minutes with the patient with telehealth technology discussing the above problems.     Medication Adjustments/Labs and Tests Ordered: Current medicines are reviewed at length with the patient today.  Concerns regarding medicines are outlined above.   Tests Ordered: No orders of the defined types were placed  in this encounter.   Medication Changes: No orders of the defined types were placed in this encounter.   Follow Up:  In Person in 3 month(s)  Signed, Carlyle Dolly, MD  12/25/2019 9:11 AM    Diagnostic Studies

## 2020-01-01 DIAGNOSIS — M5136 Other intervertebral disc degeneration, lumbar region: Secondary | ICD-10-CM | POA: Diagnosis not present

## 2020-01-01 DIAGNOSIS — R200 Anesthesia of skin: Secondary | ICD-10-CM | POA: Diagnosis not present

## 2020-01-01 DIAGNOSIS — M79606 Pain in leg, unspecified: Secondary | ICD-10-CM | POA: Diagnosis not present

## 2020-01-13 DIAGNOSIS — R809 Proteinuria, unspecified: Secondary | ICD-10-CM | POA: Diagnosis not present

## 2020-01-13 DIAGNOSIS — I129 Hypertensive chronic kidney disease with stage 1 through stage 4 chronic kidney disease, or unspecified chronic kidney disease: Secondary | ICD-10-CM | POA: Diagnosis not present

## 2020-01-13 DIAGNOSIS — R600 Localized edema: Secondary | ICD-10-CM | POA: Diagnosis not present

## 2020-01-13 DIAGNOSIS — N189 Chronic kidney disease, unspecified: Secondary | ICD-10-CM | POA: Diagnosis not present

## 2020-01-13 DIAGNOSIS — E876 Hypokalemia: Secondary | ICD-10-CM | POA: Diagnosis not present

## 2020-01-13 DIAGNOSIS — E1122 Type 2 diabetes mellitus with diabetic chronic kidney disease: Secondary | ICD-10-CM | POA: Diagnosis not present

## 2020-01-14 ENCOUNTER — Telehealth: Payer: Self-pay | Admitting: Cardiology

## 2020-01-14 NOTE — Telephone Encounter (Signed)
Calling to give BP readings, please call [phone]

## 2020-01-14 NOTE — Telephone Encounter (Signed)
Pt calling with BP reading after increase of hydralazine 75 mg tid   7/19 BP 137/80 HR 96 7/20 BP 136/74 HR 97 7/21 BP 140/74 HR 93 7/22 BP 120/74 HR 99 7/23 BP 133/79 HR 96 7/24 BP 140/82 HR 97 7/25 BP 121/75 HR 98 7/26 BP 149/81 HR 94 7/27 BP 118/66 HR 96 7/28 BP 144/80 HR 97  7/29 BP 154/90 HR 96 7/30 BP 122/69  HR 96 8/1 BP 146/81 HR 96 8/3 BP 156/84 HR 97

## 2020-01-14 NOTE — Telephone Encounter (Signed)
Pt has been taking hydralazine 75 mg tid

## 2020-01-14 NOTE — Telephone Encounter (Signed)
BPs on average running a little high, increase hydralazine to 50mg  tid. Update [place] again in 1 week  Zandra Abts MD

## 2020-01-15 DIAGNOSIS — I129 Hypertensive chronic kidney disease with stage 1 through stage 4 chronic kidney disease, or unspecified chronic kidney disease: Secondary | ICD-10-CM | POA: Diagnosis not present

## 2020-01-15 DIAGNOSIS — E1122 Type 2 diabetes mellitus with diabetic chronic kidney disease: Secondary | ICD-10-CM | POA: Diagnosis not present

## 2020-01-15 DIAGNOSIS — N189 Chronic kidney disease, unspecified: Secondary | ICD-10-CM | POA: Diagnosis not present

## 2020-01-15 DIAGNOSIS — E876 Hypokalemia: Secondary | ICD-10-CM | POA: Diagnosis not present

## 2020-01-15 DIAGNOSIS — R809 Proteinuria, unspecified: Secondary | ICD-10-CM | POA: Diagnosis not present

## 2020-01-15 DIAGNOSIS — E1129 Type 2 diabetes mellitus with other diabetic kidney complication: Secondary | ICD-10-CM | POA: Diagnosis not present

## 2020-01-15 NOTE — Telephone Encounter (Signed)
Please change hydral to 100mg  tid   Zandra Abts MD

## 2020-01-16 MED ORDER — HYDRALAZINE HCL 100 MG PO TABS: 100.0000 mg | ORAL_TABLET | Freq: Three times a day (TID) | ORAL | 6 refills | Status: DC

## 2020-01-16 NOTE — Telephone Encounter (Signed)
Patient notified and verbalized understanding. 

## 2020-01-23 DIAGNOSIS — Z299 Encounter for prophylactic measures, unspecified: Secondary | ICD-10-CM | POA: Diagnosis not present

## 2020-01-23 DIAGNOSIS — R5383 Other fatigue: Secondary | ICD-10-CM | POA: Diagnosis not present

## 2020-01-23 DIAGNOSIS — Z0000 Encounter for general adult medical examination without abnormal findings: Secondary | ICD-10-CM | POA: Diagnosis not present

## 2020-01-23 DIAGNOSIS — E1165 Type 2 diabetes mellitus with hyperglycemia: Secondary | ICD-10-CM | POA: Diagnosis not present

## 2020-01-23 DIAGNOSIS — Z6834 Body mass index (BMI) 34.0-34.9, adult: Secondary | ICD-10-CM | POA: Diagnosis not present

## 2020-01-23 DIAGNOSIS — Z1339 Encounter for screening examination for other mental health and behavioral disorders: Secondary | ICD-10-CM | POA: Diagnosis not present

## 2020-01-23 DIAGNOSIS — I10 Essential (primary) hypertension: Secondary | ICD-10-CM | POA: Diagnosis not present

## 2020-01-23 DIAGNOSIS — Z1331 Encounter for screening for depression: Secondary | ICD-10-CM | POA: Diagnosis not present

## 2020-01-23 DIAGNOSIS — E7800 Pure hypercholesterolemia, unspecified: Secondary | ICD-10-CM | POA: Diagnosis not present

## 2020-01-23 DIAGNOSIS — Z7189 Other specified counseling: Secondary | ICD-10-CM | POA: Diagnosis not present

## 2020-01-25 DIAGNOSIS — M5124 Other intervertebral disc displacement, thoracic region: Secondary | ICD-10-CM | POA: Diagnosis not present

## 2020-01-25 DIAGNOSIS — M48061 Spinal stenosis, lumbar region without neurogenic claudication: Secondary | ICD-10-CM | POA: Diagnosis not present

## 2020-01-25 DIAGNOSIS — M2578 Osteophyte, vertebrae: Secondary | ICD-10-CM | POA: Diagnosis not present

## 2020-01-25 DIAGNOSIS — M4802 Spinal stenosis, cervical region: Secondary | ICD-10-CM | POA: Diagnosis not present

## 2020-01-25 DIAGNOSIS — M47816 Spondylosis without myelopathy or radiculopathy, lumbar region: Secondary | ICD-10-CM | POA: Diagnosis not present

## 2020-01-25 DIAGNOSIS — Z01818 Encounter for other preprocedural examination: Secondary | ICD-10-CM | POA: Diagnosis not present

## 2020-01-25 DIAGNOSIS — M47814 Spondylosis without myelopathy or radiculopathy, thoracic region: Secondary | ICD-10-CM | POA: Diagnosis not present

## 2020-01-28 DIAGNOSIS — Z79899 Other long term (current) drug therapy: Secondary | ICD-10-CM | POA: Diagnosis not present

## 2020-01-28 DIAGNOSIS — E7800 Pure hypercholesterolemia, unspecified: Secondary | ICD-10-CM | POA: Diagnosis not present

## 2020-01-28 DIAGNOSIS — R5383 Other fatigue: Secondary | ICD-10-CM | POA: Diagnosis not present

## 2020-01-29 DIAGNOSIS — M48062 Spinal stenosis, lumbar region with neurogenic claudication: Secondary | ICD-10-CM | POA: Diagnosis not present

## 2020-01-29 DIAGNOSIS — M47816 Spondylosis without myelopathy or radiculopathy, lumbar region: Secondary | ICD-10-CM | POA: Diagnosis not present

## 2020-01-29 DIAGNOSIS — I7090 Unspecified atherosclerosis: Secondary | ICD-10-CM | POA: Diagnosis not present

## 2020-01-29 DIAGNOSIS — M4316 Spondylolisthesis, lumbar region: Secondary | ICD-10-CM | POA: Diagnosis not present

## 2020-01-31 ENCOUNTER — Telehealth: Payer: Self-pay | Admitting: Cardiology

## 2020-01-31 NOTE — Telephone Encounter (Signed)
New message      [facility] Pre-operative Risk Assessment    HEARTCARE STAFF: - Please ensure there is not already an duplicate clearance open for this procedure. - Under Visit Info/Reason for Call, type in Other and utilize the format Clearance MM/DD/YY or Clearance TBD. Do not use dashes or single digits. - If request is for dental extraction, please clarify the # of teeth to be extracted.  Request for surgical clearance:  1. What type of surgery is being performed? l3 4 l 4 5 minumally inivasive decompression  2. When is this surgery scheduled? 02/13/20  3. What type of clearance is required (medical clearance vs. Pharmacy clearance to hold med vs. Both)? both  4. Are there any medications that need to be held prior to surgery and how long xarelto Dr Patience Musca wants it held 2 days prior and 1 week after   5. Practice name and name of physician performing surgery? Dr tadhg Patience Musca    6. What is the office phone number? [phone]   7.   What is the office fax number? [phone]   8.   Anesthesia type (None, local, MAC, general) ? general   Lauren Hamilton 01/31/2020, 3:39 PM  _________________________________________________________________   (provider comments below)

## 2020-02-03 NOTE — Telephone Encounter (Signed)
Patient with diagnosis of unprovoked PE in 2017-2018, treated with anticoag for 1 year, then with recurrent bilateral PE in May 2019 when off of anticoagulation, now on Xarelto indefinitely.  Procedure: minimally invasive decompression of the spine  Date of procedure: 02/13/20  CrCl: 45 mL/min using adjusted body weight Platelets: 226K  Typically hold Xarelto for 3 days prior to spinal procedure and resume within 24 hours after. Request is to hold 2 days prior and a week after which would not be recommended due to history of recurrent unprovoked PEs. Will defer to MD regarding anticoag hold.

## 2020-02-03 NOTE — Telephone Encounter (Signed)
Please comment on request to hold Xarelto 2 days pre op and "one week post op".  Pt had bilateral PE in May 2019.  Kerin Ransom PA-C 02/03/2020 11:01 AM

## 2020-02-04 NOTE — Telephone Encounter (Signed)
   Primary Cardiologist: Carlyle Dolly, MD  Chart reviewed and the patient was contacted today by phone as part of pre-operative protocol coverage. Given past medical history and time since last visit, based on ACC/AHA guidelines, Lauren Hamilton would be at acceptable risk for the planned procedure without further cardiovascular testing.   Dr. Harl Bowie recommends a holding Xarelto 3 days preop and resuming the day after surgery because of the patient's high risk of recurrent pulmonary embolism.  The patient was informed of these recommendations and understands.  I will route this recommendation to the requesting party via Epic fax function and remove from pre-op pool.  Please call with questions.  Kerin Ransom, PA-C 02/04/2020, 2:22 PM

## 2020-02-04 NOTE — Telephone Encounter (Signed)
Agree with holding for 3 days prior and resuming day after procedure. Would not recommend holding longer. Would be up to surgeon to discuss risks vs benefits if cannot perform surgery on our recommended protocol with the patient.    Carlyle Dolly MD

## 2020-02-07 DIAGNOSIS — Z01812 Encounter for preprocedural laboratory examination: Secondary | ICD-10-CM | POA: Diagnosis not present

## 2020-02-07 DIAGNOSIS — Z20822 Contact with and (suspected) exposure to covid-19: Secondary | ICD-10-CM | POA: Diagnosis not present

## 2020-02-07 DIAGNOSIS — M48062 Spinal stenosis, lumbar region with neurogenic claudication: Secondary | ICD-10-CM | POA: Diagnosis not present

## 2020-02-14 DIAGNOSIS — E669 Obesity, unspecified: Secondary | ICD-10-CM | POA: Diagnosis not present

## 2020-02-14 DIAGNOSIS — M48062 Spinal stenosis, lumbar region with neurogenic claudication: Secondary | ICD-10-CM | POA: Diagnosis not present

## 2020-02-14 DIAGNOSIS — Z20822 Contact with and (suspected) exposure to covid-19: Secondary | ICD-10-CM | POA: Diagnosis not present

## 2020-02-14 DIAGNOSIS — Z01818 Encounter for other preprocedural examination: Secondary | ICD-10-CM | POA: Diagnosis not present

## 2020-02-14 DIAGNOSIS — Z7901 Long term (current) use of anticoagulants: Secondary | ICD-10-CM | POA: Diagnosis not present

## 2020-02-14 DIAGNOSIS — Z01812 Encounter for preprocedural laboratory examination: Secondary | ICD-10-CM | POA: Diagnosis not present

## 2020-02-20 ENCOUNTER — Telehealth: Payer: Self-pay | Admitting: Cardiology

## 2020-02-20 DIAGNOSIS — I10 Essential (primary) hypertension: Secondary | ICD-10-CM

## 2020-02-20 NOTE — Telephone Encounter (Signed)
Pt says since increase of hydralazine 100 mg tid dizziness/and left sided headache has gotten worse - cannot read and is scared to drive with being so dizzy - had her eyes check and ophthalmologist didn't think symptoms were related to her eyes - see recent BP readings below

## 2020-02-20 NOTE — Telephone Encounter (Signed)
New message     Pt c/o BP issue: STAT if pt c/o blurred vision, one-sided weakness or slurred speech  1. What are your last 5 BP readings? 9/3 141/76 9/4 144/78 9/6 146/75 9/7 129/69 9/8 136/72 9/9 131/65  2. Are you having any other symptoms (ex. Dizziness, headache, blurred vision, passed out)? Yes dizziness   3. What is your BP issue? Med she is on is making her dizzy and nauseated , groggy headed, not feeling well.  meds are not controlling the bp

## 2020-02-21 NOTE — Telephone Encounter (Signed)
Pt says she has been dizzy since being on hydralazine and would like to hold until Monday - she will call us back for update

## 2020-02-21 NOTE — Telephone Encounter (Signed)
Go back to the 75mg  tid and udpate [place] on symptoms early next week to see if improved   Zandra Abts MD

## 2020-02-24 DIAGNOSIS — Z9989 Dependence on other enabling machines and devices: Secondary | ICD-10-CM | POA: Diagnosis not present

## 2020-02-24 DIAGNOSIS — G4733 Obstructive sleep apnea (adult) (pediatric): Secondary | ICD-10-CM | POA: Diagnosis not present

## 2020-02-24 DIAGNOSIS — Z6835 Body mass index (BMI) 35.0-35.9, adult: Secondary | ICD-10-CM | POA: Diagnosis not present

## 2020-02-24 NOTE — Telephone Encounter (Signed)
BP yesterday 165/86 BP today 145/70 - hasn't taken hydralazine since Friday and feels much better denies any dizziness doesn't want to try hydralazine again

## 2020-02-24 NOTE — Telephone Encounter (Signed)
Patient returned call

## 2020-02-25 MED ORDER — SPIRONOLACTONE 25 MG PO TABS
25.0000 mg | ORAL_TABLET | Freq: Every day | ORAL | 3 refills | Status: DC
Start: 2020-02-25 — End: 2020-03-17

## 2020-02-25 NOTE — Telephone Encounter (Signed)
BP reading for the last few days (says she gave older BP readings yesterday) 157/88 169/93 146/87 129/83

## 2020-02-25 NOTE — Telephone Encounter (Signed)
Pt voiced understanding - Medication sent to pharmacy. Dr Theador Hawthorne to do labs in 2 weeks and pt will have them do BMP

## 2020-02-25 NOTE — Telephone Encounter (Signed)
Can stop hydralazine, bp's too high. Start aldactone 25mg  daily, needs BMET 2 weeks   Zandra Abts MD

## 2020-02-25 NOTE — Telephone Encounter (Signed)
Patient called stating that she gave the wrong BP readings yesterday.

## 2020-03-09 ENCOUNTER — Telehealth: Payer: Self-pay | Admitting: Cardiology

## 2020-03-09 NOTE — Telephone Encounter (Signed)
Reports new medication (spironolactone) 25 mg is causing her to have symtpoms that started one week of staring it. Reports headache, diarrhea is bad, leg and feet cramps and dry hacking cough that feels like its going to choke her. Took it at night and last dose was last night. BP today is 154/89 at 8:00 am. Saturday 175/98. Denies chest pain, dizziness or sob. Advised to hold spironolactone until she hears back from our office. Advised that message would be sent to provider and if symptoms get worse between now and then, to go to the ED for an evaluation. Verbalized understanding of plan.

## 2020-03-09 NOTE — Telephone Encounter (Signed)
Patient called stating that the new medication she was put on is causing diarrhea.

## 2020-03-10 DIAGNOSIS — G4733 Obstructive sleep apnea (adult) (pediatric): Secondary | ICD-10-CM | POA: Diagnosis not present

## 2020-03-11 ENCOUNTER — Other Ambulatory Visit: Payer: Self-pay | Admitting: Cardiology

## 2020-03-11 DIAGNOSIS — R809 Proteinuria, unspecified: Secondary | ICD-10-CM | POA: Diagnosis not present

## 2020-03-11 DIAGNOSIS — E1129 Type 2 diabetes mellitus with other diabetic kidney complication: Secondary | ICD-10-CM | POA: Diagnosis not present

## 2020-03-11 DIAGNOSIS — E1122 Type 2 diabetes mellitus with diabetic chronic kidney disease: Secondary | ICD-10-CM | POA: Diagnosis not present

## 2020-03-11 DIAGNOSIS — E876 Hypokalemia: Secondary | ICD-10-CM | POA: Diagnosis not present

## 2020-03-11 DIAGNOSIS — R0789 Other chest pain: Secondary | ICD-10-CM | POA: Diagnosis not present

## 2020-03-11 DIAGNOSIS — I129 Hypertensive chronic kidney disease with stage 1 through stage 4 chronic kidney disease, or unspecified chronic kidney disease: Secondary | ICD-10-CM | POA: Diagnosis not present

## 2020-03-11 DIAGNOSIS — I10 Essential (primary) hypertension: Secondary | ICD-10-CM | POA: Diagnosis not present

## 2020-03-11 DIAGNOSIS — N189 Chronic kidney disease, unspecified: Secondary | ICD-10-CM | POA: Diagnosis not present

## 2020-03-12 DIAGNOSIS — I129 Hypertensive chronic kidney disease with stage 1 through stage 4 chronic kidney disease, or unspecified chronic kidney disease: Secondary | ICD-10-CM | POA: Diagnosis not present

## 2020-03-12 DIAGNOSIS — E1122 Type 2 diabetes mellitus with diabetic chronic kidney disease: Secondary | ICD-10-CM | POA: Diagnosis not present

## 2020-03-12 DIAGNOSIS — K219 Gastro-esophageal reflux disease without esophagitis: Secondary | ICD-10-CM | POA: Diagnosis not present

## 2020-03-12 DIAGNOSIS — N184 Chronic kidney disease, stage 4 (severe): Secondary | ICD-10-CM | POA: Diagnosis not present

## 2020-03-12 LAB — BASIC METABOLIC PANEL WITH GFR
BUN/Creatinine Ratio: 20 (calc) (ref 6–22)
BUN: 26 mg/dL — ABNORMAL HIGH (ref 7–25)
CO2: 27 mmol/L (ref 20–32)
Calcium: 9.8 mg/dL (ref 8.6–10.4)
Chloride: 101 mmol/L (ref 98–110)
Creat: 1.27 mg/dL — ABNORMAL HIGH (ref 0.60–0.93)
GFR, Est African American: 48 mL/min/{1.73_m2} — ABNORMAL LOW (ref >=60)
GFR, Est Non African American: 42 mL/min/{1.73_m2} — ABNORMAL LOW (ref >=60)
Glucose, Bld: 106 mg/dL (ref 65–139)
Potassium: 4.2 mmol/L (ref 3.5–5.3)
Sodium: 138 mmol/L (ref 135–146)

## 2020-03-12 LAB — MAGNESIUM: Magnesium: 2 mg/dL (ref 1.5–2.5)

## 2020-03-12 LAB — TSH: TSH: 1.68 mIU/L (ref 0.40–4.50)

## 2020-03-12 NOTE — Telephone Encounter (Signed)
Can we recheck on symptoms now that she has been off med   Zandra Abts MD

## 2020-03-13 NOTE — Telephone Encounter (Signed)
Pt reports that her symptoms are improving after stopping Spironolactone. She reports that she feels her cough is coming from her sinuses. Pt states that leg and feet cramps have improved after she started taking Potassium at night. Current BP is 145/?. Please advise.

## 2020-03-17 DIAGNOSIS — Z85820 Personal history of malignant melanoma of skin: Secondary | ICD-10-CM | POA: Diagnosis not present

## 2020-03-17 DIAGNOSIS — L821 Other seborrheic keratosis: Secondary | ICD-10-CM | POA: Diagnosis not present

## 2020-03-17 DIAGNOSIS — Z85828 Personal history of other malignant neoplasm of skin: Secondary | ICD-10-CM | POA: Diagnosis not present

## 2020-03-17 DIAGNOSIS — L570 Actinic keratosis: Secondary | ICD-10-CM | POA: Diagnosis not present

## 2020-03-17 NOTE — Telephone Encounter (Signed)
Pt will stop aldactone for now and f/u with Dr.branch on 03/30/20

## 2020-03-17 NOTE — Telephone Encounter (Signed)
Can stay off aldactone at this time, we will reevalaute at our appt in October   J Sanaiya Welliver MD

## 2020-03-18 ENCOUNTER — Telehealth: Payer: Self-pay | Admitting: *Deleted

## 2020-03-18 DIAGNOSIS — R768 Other specified abnormal immunological findings in serum: Secondary | ICD-10-CM | POA: Diagnosis not present

## 2020-03-18 DIAGNOSIS — E1129 Type 2 diabetes mellitus with other diabetic kidney complication: Secondary | ICD-10-CM | POA: Diagnosis not present

## 2020-03-18 DIAGNOSIS — I129 Hypertensive chronic kidney disease with stage 1 through stage 4 chronic kidney disease, or unspecified chronic kidney disease: Secondary | ICD-10-CM | POA: Diagnosis not present

## 2020-03-18 DIAGNOSIS — N189 Chronic kidney disease, unspecified: Secondary | ICD-10-CM | POA: Diagnosis not present

## 2020-03-18 DIAGNOSIS — E876 Hypokalemia: Secondary | ICD-10-CM | POA: Diagnosis not present

## 2020-03-18 DIAGNOSIS — R809 Proteinuria, unspecified: Secondary | ICD-10-CM | POA: Diagnosis not present

## 2020-03-18 DIAGNOSIS — E1122 Type 2 diabetes mellitus with diabetic chronic kidney disease: Secondary | ICD-10-CM | POA: Diagnosis not present

## 2020-03-18 NOTE — Telephone Encounter (Signed)
Pt voiced understanding

## 2020-03-18 NOTE — Telephone Encounter (Signed)
-----   Message from Arnoldo Lenis, MD sent at 03/18/2020  3:59 PM EDT ----- Labs look fine, kidney function remains decreased but better from last check  Zandra Abts MD

## 2020-03-20 NOTE — Telephone Encounter (Signed)
Spoke with patient, she stated that we could dispose of vials. Patient stated that she is very appreciative and would call us if she had an issues.

## 2020-03-24 DIAGNOSIS — M5416 Radiculopathy, lumbar region: Secondary | ICD-10-CM | POA: Diagnosis not present

## 2020-03-30 ENCOUNTER — Ambulatory Visit: Payer: PPO | Admitting: Cardiology

## 2020-03-30 ENCOUNTER — Other Ambulatory Visit: Payer: Self-pay

## 2020-03-30 ENCOUNTER — Encounter: Payer: Self-pay | Admitting: Cardiology

## 2020-03-30 VITALS — BP 140/66 | HR 84 | Ht 66.0 in | Wt 225.4 lb

## 2020-03-30 DIAGNOSIS — I10 Essential (primary) hypertension: Secondary | ICD-10-CM

## 2020-03-30 MED ORDER — CHLORTHALIDONE 25 MG PO TABS
25.0000 mg | ORAL_TABLET | Freq: Every day | ORAL | 3 refills | Status: DC
Start: 2020-03-30 — End: 2020-08-03

## 2020-03-30 MED ORDER — CARVEDILOL 3.125 MG PO TABS
3.1250 mg | ORAL_TABLET | Freq: Two times a day (BID) | ORAL | 3 refills | Status: DC
Start: 2020-03-30 — End: 2020-04-16

## 2020-03-30 NOTE — Progress Notes (Signed)
Clinical Summary Lauren Hamilton is a 74 y.o.female seen today for follow up of the following medical problems.This is a focused visit on history of HTN and recent issues with high bp's and medication side effects.     1. HTN - allergies clonidine, norvasc, lisinopril, losartan.  - more recently reported side effects to hydralazine, aldactone - issues with gout   - home bp's 140-160s/80s-105 - appears she mistakingly stopped the chlorthalidone as well, was only to stop aldactone and hydralazine.        Past Medical History:  Diagnosis Date  . Arthritis   . 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 Bronchitis   . Cancer (HCC)    Skin  . Diabetes mellitus without complication ([place])   . Gout   . H/O colonoscopy with polypectomy   . Hypercholesteremia   . Hypertension   . Renal disorder   . Renal insufficiency      Allergies  Allergen Reactions  . Clonidine Derivatives     Dizzy, dry mouth  . Etodolac     Unknown reaction  . Hydrocodone Other (See Comments)    Altered mental status-Hallucination, tolerated in low doses   . Naproxen Other (See Comments)    Damage to kidney   . Statins Other (See Comments)    Unknown reaction, tolerates lovastatin   . Tramadol     Patient, "Felt funny, didn't feel right, and felt weird."  . Amlodipine Cough  . Lisinopril Cough  . Losartan Cough     Current Outpatient Medications  Medication Sig Dispense Refill  . allopurinol (ZYLOPRIM) 100 MG tablet Take 100 mg by mouth at bedtime.    . Artificial Tear Solution (SOOTHE XP) SOLN Apply 1-2 drops to eye as needed (dry eyes).     [name] azelastine (ASTELIN) 0.1 % nasal spray Place 2 sprays into both nostrils 2 (two) times daily.    . chlorthalidone (HYGROTON) 25 MG tablet Take 1 tablet (25 mg total) by mouth daily. 90 tablet 1  .  colchicine-probenecid 0.5-500 MG tablet Take 1 tablet by mouth as directed.    . cyclobenzaprine (FLEXERIL) 10 MG tablet Take 5 mg by mouth 2 (two) times daily as needed for muscle spasms.     . ferrous sulfate 325 (65 FE) MG EC tablet Take 1 tablet by mouth daily.    . furosemide (LASIX) 20 MG tablet Take 1 tablet (20 mg total) by mouth daily as needed. 90 tablet 1  . omeprazole (PRILOSEC) 40 MG capsule Take 40 mg by mouth daily.    . potassium chloride SA (KLOR-CON) 20 MEQ tablet Take 1 tablet by mouth daily.    . rivaroxaban (XARELTO) 20 MG TABS tablet Take 1 tablet (20 mg total) by mouth daily with supper. Start using after you complete the starter pack 30 tablet 3  . sodium chloride (OCEAN) 0.65 % SOLN nasal spray Place 1 spray into both nostrils as needed for congestion.    . SUMAtriptan (IMITREX) 100 MG tablet Take 1 tablet by mouth daily. Take 1 tab at the first sign of headache    . vitamin E 400 UNIT capsule Take 400 Units by mouth daily.     No current facility-administered medications for this visit.     Past Surgical History:  Procedure Laterality Date  . bilateral heel spur removed    .  carpel tunnel Bilateral   . CHOLECYSTECTOMY    . COLONOSCOPY  2013   [facility]: hyperplastic polyps. advised 5 year follow up colonoscopy.   . COLONOSCOPY N/A 06/12/2018   Procedure: COLONOSCOPY;  Surgeon: [name], MD;  [facility];  Service: Endoscopy;  Laterality: N/A;  8:30am  . KNEE ARTHROSCOPY Right   . PARTIAL HYSTERECTOMY    . POLYPECTOMY  06/12/2018   Procedure: POLYPECTOMY;  Surgeon: [name], MD;  [facility];  Service: Endoscopy;;  hep.flex  . THYROIDECTOMY, PARTIAL    . TONSILLECTOMY       Allergies  Allergen Reactions  . Clonidine Derivatives     Dizzy, dry mouth  . Etodolac     Unknown reaction  . Hydrocodone Other (See Comments)    Altered mental status-Hallucination, tolerated in low doses   . Naproxen  Other (See Comments)    Damage to kidney   . Statins Other (See Comments)    Unknown reaction, tolerates lovastatin   . Tramadol     Patient, "Felt funny, didn't feel right, and felt weird."  . Amlodipine Cough  . Lisinopril Cough  . Losartan Cough      Family History  Problem Relation Age of Onset  . Allergic rhinitis Sister   . Allergic rhinitis Brother   . Hypertension Mother   . Heart attack Father   . Cirrhosis Father   . Emphysema Father   . Breast cancer Maternal Aunt   . Colon cancer Neg Hx      Social History Lauren Hamilton reports that she has never smoked. She has never used smokeless tobacco. Lauren Hamilton reports no history of alcohol use.   Review of Systems CONSTITUTIONAL: No weight loss, fever, chills, weakness or fatigue.  HEENT: Eyes: No visual loss, blurred vision, double vision or yellow sclerae.No hearing loss, sneezing, congestion, runny nose or sore throat.  SKIN: No rash or itching.  CARDIOVASCULAR: per hpi RESPIRATORY: No shortness of breath, cough or sputum.  GASTROINTESTINAL: No anorexia, nausea, vomiting or diarrhea. No abdominal pain or blood.  GENITOURINARY: No burning on urination, no polyuria NEUROLOGICAL: No headache, dizziness, syncope, paralysis, ataxia, numbness or tingling in the extremities. No change in bowel or bladder control.  MUSCULOSKELETAL: No muscle, back pain, joint pain or stiffness.  LYMPHATICS: No enlarged nodes. No history of splenectomy.  PSYCHIATRIC: No history of depression or anxiety.  ENDOCRINOLOGIC: No reports of sweating, cold or heat intolerance. No polyuria or polydipsia.  [name]   Physical Examination Today's Vitals   03/30/20 1416  BP: 140/66  Pulse: 84  SpO2: 96%  Weight: 225 lb 6.4 oz (102.2 kg)  Height: 5\' 6"  (1.676 m)   Body mass index is 36.38 kg/m.  Gen: resting comfortably, no acute distress HEENT: no scleral icterus, pupils equal round and reactive, no palptable cervical adenopathy,  CV: RRR, no  m/r/g, no jvd Resp: Clear to auscultation bilaterally GI: abdomen is soft, non-tender, non-distended, normal bowel sounds, no hepatosplenomegaly MSK: extremities are warm, no edema.  Skin: warm, no rash Neuro:  no focal deficits Psych: appropriate affect   Diagnostic Studies 12/2019 nuclear stress  There was no ST segment deviation noted during stress.  The study is normal. There are no perfusion defects consistent with prior infarct or current ischemia.  This is a low risk study.  The left ventricular ejection fraction is normal (55-65%).    Assessment and Plan  1. HTN - multiple medication side  effects as listed above - presently has only tolerated chlorthalidone, but accidentyl stopped taking. Will restart at 25mg  daily - not many med classes left to try, will try coreg 3.125mg  bid and follow bp's.       Arnoldo Lenis, M.D.

## 2020-03-30 NOTE — Patient Instructions (Signed)
Medication Instructions:  Your physician has recommended you make the following change in your medication:   Start Chlorthalidone 25 mg Daily  Start Coreg 3.125 mg Two Times Daily   *If you need a refill on your cardiac medications before your next appointment, please call your pharmacy*   Lab Work: NONE   If you have labs (blood work) drawn today and your tests are completely normal, you will receive your results only by:  [place] (if you have MyChart) OR  A paper copy in the mail If you have any lab test that is abnormal or we need to change your treatment, we will call you to review the results.   Testing/Procedures: NONE    Follow-Up: At [facility], you and your health needs are our priority.  As part of our continuing mission to provide you with exceptional heart care, we have created designated Provider Care Teams.  These Care Teams include your primary Cardiologist (physician) and Advanced Practice Providers (APPs -  Physician Assistants and Nurse Practitioners) who all work together to provide you with the care you need, when you need it.  We recommend signing up for the patient portal called "MyChart".  Sign up information is provided on this After Visit Summary.  MyChart is used to connect with patients for Virtual Visits (Telemedicine).  Patients are able to view lab/test results, encounter notes, upcoming appointments, etc.  Non-urgent messages can be sent to your provider as well.   To learn more about what you can do with MyChart, go to [email or web address].    Your next appointment:   4 month(s)  The format for your next appointment:   In Person  Provider:   Carlyle Dolly, MD   Other Instructions Thank you for choosing [facility]!

## 2020-04-02 DIAGNOSIS — E1165 Type 2 diabetes mellitus with hyperglycemia: Secondary | ICD-10-CM | POA: Diagnosis not present

## 2020-04-02 DIAGNOSIS — E1129 Type 2 diabetes mellitus with other diabetic kidney complication: Secondary | ICD-10-CM | POA: Diagnosis not present

## 2020-04-02 DIAGNOSIS — Z299 Encounter for prophylactic measures, unspecified: Secondary | ICD-10-CM | POA: Diagnosis not present

## 2020-04-02 DIAGNOSIS — Z6834 Body mass index (BMI) 34.0-34.9, adult: Secondary | ICD-10-CM | POA: Diagnosis not present

## 2020-04-02 DIAGNOSIS — Z20822 Contact with and (suspected) exposure to covid-19: Secondary | ICD-10-CM | POA: Diagnosis not present

## 2020-04-02 DIAGNOSIS — R809 Proteinuria, unspecified: Secondary | ICD-10-CM | POA: Diagnosis not present

## 2020-04-10 DIAGNOSIS — E1122 Type 2 diabetes mellitus with diabetic chronic kidney disease: Secondary | ICD-10-CM | POA: Diagnosis not present

## 2020-04-10 DIAGNOSIS — I129 Hypertensive chronic kidney disease with stage 1 through stage 4 chronic kidney disease, or unspecified chronic kidney disease: Secondary | ICD-10-CM | POA: Diagnosis not present

## 2020-04-10 DIAGNOSIS — N184 Chronic kidney disease, stage 4 (severe): Secondary | ICD-10-CM | POA: Diagnosis not present

## 2020-04-10 DIAGNOSIS — K219 Gastro-esophageal reflux disease without esophagitis: Secondary | ICD-10-CM | POA: Diagnosis not present

## 2020-04-15 ENCOUNTER — Telehealth: Payer: Self-pay | Admitting: Cardiology

## 2020-04-15 NOTE — Telephone Encounter (Signed)
NA, unable to leave msg d/t mailbox being full

## 2020-04-15 NOTE — Telephone Encounter (Signed)
Please give pt a call concerning BP readings   [phone]

## 2020-04-15 NOTE — Telephone Encounter (Signed)
Returned call to pt. No answer. Unable to leave msg d/t mailbox being full.  ?

## 2020-04-16 MED ORDER — CARVEDILOL 6.25 MG PO TABS
6.2500 mg | ORAL_TABLET | Freq: Two times a day (BID) | ORAL | 3 refills | Status: DC
Start: 2020-04-16 — End: 2021-02-22

## 2020-04-16 NOTE — Telephone Encounter (Signed)
BP's running high, can we increase coreg to 6.25mg  bid   Zandra Abts MD

## 2020-04-16 NOTE — Telephone Encounter (Signed)
Since starting Coreg 3.125 mg BID and Chorthalidone 25 mg qd, patients BP has ranged form 143/83 to 163/98. Was told to call by pharmacist that work for her pcp.

## 2020-04-16 NOTE — Telephone Encounter (Signed)
Patient informed,will increase Coreg to 6.25 mg BID

## 2020-04-17 DIAGNOSIS — Z01812 Encounter for preprocedural laboratory examination: Secondary | ICD-10-CM | POA: Diagnosis not present

## 2020-04-17 DIAGNOSIS — Z20822 Contact with and (suspected) exposure to covid-19: Secondary | ICD-10-CM | POA: Diagnosis not present

## 2020-04-17 DIAGNOSIS — M48062 Spinal stenosis, lumbar region with neurogenic claudication: Secondary | ICD-10-CM | POA: Diagnosis not present

## 2020-04-23 DIAGNOSIS — E785 Hyperlipidemia, unspecified: Secondary | ICD-10-CM | POA: Diagnosis not present

## 2020-04-23 DIAGNOSIS — E1122 Type 2 diabetes mellitus with diabetic chronic kidney disease: Secondary | ICD-10-CM | POA: Diagnosis not present

## 2020-04-23 DIAGNOSIS — Z86711 Personal history of pulmonary embolism: Secondary | ICD-10-CM | POA: Diagnosis not present

## 2020-04-23 DIAGNOSIS — G4733 Obstructive sleep apnea (adult) (pediatric): Secondary | ICD-10-CM | POA: Diagnosis not present

## 2020-04-23 DIAGNOSIS — I129 Hypertensive chronic kidney disease with stage 1 through stage 4 chronic kidney disease, or unspecified chronic kidney disease: Secondary | ICD-10-CM | POA: Diagnosis not present

## 2020-04-23 DIAGNOSIS — N183 Chronic kidney disease, stage 3 unspecified: Secondary | ICD-10-CM | POA: Diagnosis not present

## 2020-04-23 DIAGNOSIS — M48062 Spinal stenosis, lumbar region with neurogenic claudication: Secondary | ICD-10-CM | POA: Diagnosis not present

## 2020-04-23 DIAGNOSIS — K219 Gastro-esophageal reflux disease without esophagitis: Secondary | ICD-10-CM | POA: Diagnosis not present

## 2020-04-23 DIAGNOSIS — M4316 Spondylolisthesis, lumbar region: Secondary | ICD-10-CM | POA: Diagnosis not present

## 2020-04-26 DIAGNOSIS — N183 Chronic kidney disease, stage 3 unspecified: Secondary | ICD-10-CM | POA: Diagnosis not present

## 2020-04-26 DIAGNOSIS — Z4789 Encounter for other orthopedic aftercare: Secondary | ICD-10-CM | POA: Diagnosis not present

## 2020-04-26 DIAGNOSIS — R600 Localized edema: Secondary | ICD-10-CM | POA: Diagnosis not present

## 2020-04-26 DIAGNOSIS — M1712 Unilateral primary osteoarthritis, left knee: Secondary | ICD-10-CM | POA: Diagnosis not present

## 2020-04-26 DIAGNOSIS — E785 Hyperlipidemia, unspecified: Secondary | ICD-10-CM | POA: Diagnosis not present

## 2020-04-26 DIAGNOSIS — E1122 Type 2 diabetes mellitus with diabetic chronic kidney disease: Secondary | ICD-10-CM | POA: Diagnosis not present

## 2020-04-26 DIAGNOSIS — R011 Cardiac murmur, unspecified: Secondary | ICD-10-CM | POA: Diagnosis not present

## 2020-04-26 DIAGNOSIS — F32A Depression, unspecified: Secondary | ICD-10-CM | POA: Diagnosis not present

## 2020-04-26 DIAGNOSIS — G43909 Migraine, unspecified, not intractable, without status migrainosus: Secondary | ICD-10-CM | POA: Diagnosis not present

## 2020-04-26 DIAGNOSIS — G4733 Obstructive sleep apnea (adult) (pediatric): Secondary | ICD-10-CM | POA: Diagnosis not present

## 2020-04-26 DIAGNOSIS — K219 Gastro-esophageal reflux disease without esophagitis: Secondary | ICD-10-CM | POA: Diagnosis not present

## 2020-04-26 DIAGNOSIS — I129 Hypertensive chronic kidney disease with stage 1 through stage 4 chronic kidney disease, or unspecified chronic kidney disease: Secondary | ICD-10-CM | POA: Diagnosis not present

## 2020-04-26 DIAGNOSIS — M48062 Spinal stenosis, lumbar region with neurogenic claudication: Secondary | ICD-10-CM | POA: Diagnosis not present

## 2020-05-11 DIAGNOSIS — E1165 Type 2 diabetes mellitus with hyperglycemia: Secondary | ICD-10-CM | POA: Diagnosis not present

## 2020-05-11 DIAGNOSIS — I2699 Other pulmonary embolism without acute cor pulmonale: Secondary | ICD-10-CM | POA: Diagnosis not present

## 2020-05-11 DIAGNOSIS — Z6835 Body mass index (BMI) 35.0-35.9, adult: Secondary | ICD-10-CM | POA: Diagnosis not present

## 2020-05-11 DIAGNOSIS — Z299 Encounter for prophylactic measures, unspecified: Secondary | ICD-10-CM | POA: Diagnosis not present

## 2020-05-11 DIAGNOSIS — I10 Essential (primary) hypertension: Secondary | ICD-10-CM | POA: Diagnosis not present

## 2020-05-11 DIAGNOSIS — E1122 Type 2 diabetes mellitus with diabetic chronic kidney disease: Secondary | ICD-10-CM | POA: Diagnosis not present

## 2020-05-11 DIAGNOSIS — N184 Chronic kidney disease, stage 4 (severe): Secondary | ICD-10-CM | POA: Diagnosis not present

## 2020-05-12 DIAGNOSIS — E1122 Type 2 diabetes mellitus with diabetic chronic kidney disease: Secondary | ICD-10-CM | POA: Diagnosis not present

## 2020-05-12 DIAGNOSIS — K219 Gastro-esophageal reflux disease without esophagitis: Secondary | ICD-10-CM | POA: Diagnosis not present

## 2020-05-12 DIAGNOSIS — I129 Hypertensive chronic kidney disease with stage 1 through stage 4 chronic kidney disease, or unspecified chronic kidney disease: Secondary | ICD-10-CM | POA: Diagnosis not present

## 2020-05-12 DIAGNOSIS — N184 Chronic kidney disease, stage 4 (severe): Secondary | ICD-10-CM | POA: Diagnosis not present

## 2020-05-21 DIAGNOSIS — J329 Chronic sinusitis, unspecified: Secondary | ICD-10-CM | POA: Diagnosis not present

## 2020-05-21 DIAGNOSIS — E1122 Type 2 diabetes mellitus with diabetic chronic kidney disease: Secondary | ICD-10-CM | POA: Diagnosis not present

## 2020-05-21 DIAGNOSIS — Z6835 Body mass index (BMI) 35.0-35.9, adult: Secondary | ICD-10-CM | POA: Diagnosis not present

## 2020-05-21 DIAGNOSIS — Z299 Encounter for prophylactic measures, unspecified: Secondary | ICD-10-CM | POA: Diagnosis not present

## 2020-05-21 DIAGNOSIS — E1165 Type 2 diabetes mellitus with hyperglycemia: Secondary | ICD-10-CM | POA: Diagnosis not present

## 2020-05-21 DIAGNOSIS — N184 Chronic kidney disease, stage 4 (severe): Secondary | ICD-10-CM | POA: Diagnosis not present

## 2020-05-27 DIAGNOSIS — M7051 Other bursitis of knee, right knee: Secondary | ICD-10-CM | POA: Diagnosis not present

## 2020-05-27 DIAGNOSIS — M1712 Unilateral primary osteoarthritis, left knee: Secondary | ICD-10-CM | POA: Diagnosis not present

## 2020-06-11 DIAGNOSIS — G4733 Obstructive sleep apnea (adult) (pediatric): Secondary | ICD-10-CM | POA: Diagnosis not present

## 2020-06-12 DIAGNOSIS — E1122 Type 2 diabetes mellitus with diabetic chronic kidney disease: Secondary | ICD-10-CM | POA: Diagnosis not present

## 2020-06-12 DIAGNOSIS — N184 Chronic kidney disease, stage 4 (severe): Secondary | ICD-10-CM | POA: Diagnosis not present

## 2020-06-12 DIAGNOSIS — I129 Hypertensive chronic kidney disease with stage 1 through stage 4 chronic kidney disease, or unspecified chronic kidney disease: Secondary | ICD-10-CM | POA: Diagnosis not present

## 2020-06-12 DIAGNOSIS — K219 Gastro-esophageal reflux disease without esophagitis: Secondary | ICD-10-CM | POA: Diagnosis not present

## 2020-06-16 DIAGNOSIS — Z299 Encounter for prophylactic measures, unspecified: Secondary | ICD-10-CM | POA: Diagnosis not present

## 2020-06-16 DIAGNOSIS — I10 Essential (primary) hypertension: Secondary | ICD-10-CM | POA: Diagnosis not present

## 2020-06-16 DIAGNOSIS — E1122 Type 2 diabetes mellitus with diabetic chronic kidney disease: Secondary | ICD-10-CM | POA: Diagnosis not present

## 2020-06-16 DIAGNOSIS — E1165 Type 2 diabetes mellitus with hyperglycemia: Secondary | ICD-10-CM | POA: Diagnosis not present

## 2020-06-16 DIAGNOSIS — J329 Chronic sinusitis, unspecified: Secondary | ICD-10-CM | POA: Diagnosis not present

## 2020-06-17 DIAGNOSIS — E1122 Type 2 diabetes mellitus with diabetic chronic kidney disease: Secondary | ICD-10-CM | POA: Diagnosis not present

## 2020-06-17 DIAGNOSIS — I129 Hypertensive chronic kidney disease with stage 1 through stage 4 chronic kidney disease, or unspecified chronic kidney disease: Secondary | ICD-10-CM | POA: Diagnosis not present

## 2020-06-17 DIAGNOSIS — E1129 Type 2 diabetes mellitus with other diabetic kidney complication: Secondary | ICD-10-CM | POA: Diagnosis not present

## 2020-06-17 DIAGNOSIS — R768 Other specified abnormal immunological findings in serum: Secondary | ICD-10-CM | POA: Diagnosis not present

## 2020-06-17 DIAGNOSIS — N189 Chronic kidney disease, unspecified: Secondary | ICD-10-CM | POA: Diagnosis not present

## 2020-06-17 DIAGNOSIS — R809 Proteinuria, unspecified: Secondary | ICD-10-CM | POA: Diagnosis not present

## 2020-06-17 DIAGNOSIS — E876 Hypokalemia: Secondary | ICD-10-CM | POA: Diagnosis not present

## 2020-06-18 DIAGNOSIS — G4733 Obstructive sleep apnea (adult) (pediatric): Secondary | ICD-10-CM | POA: Diagnosis not present

## 2020-06-24 DIAGNOSIS — R768 Other specified abnormal immunological findings in serum: Secondary | ICD-10-CM | POA: Diagnosis not present

## 2020-06-24 DIAGNOSIS — I129 Hypertensive chronic kidney disease with stage 1 through stage 4 chronic kidney disease, or unspecified chronic kidney disease: Secondary | ICD-10-CM | POA: Diagnosis not present

## 2020-06-24 DIAGNOSIS — E1129 Type 2 diabetes mellitus with other diabetic kidney complication: Secondary | ICD-10-CM | POA: Diagnosis not present

## 2020-06-24 DIAGNOSIS — E1122 Type 2 diabetes mellitus with diabetic chronic kidney disease: Secondary | ICD-10-CM | POA: Diagnosis not present

## 2020-06-24 DIAGNOSIS — N189 Chronic kidney disease, unspecified: Secondary | ICD-10-CM | POA: Diagnosis not present

## 2020-06-24 DIAGNOSIS — Z5181 Encounter for therapeutic drug level monitoring: Secondary | ICD-10-CM | POA: Diagnosis not present

## 2020-06-24 DIAGNOSIS — R809 Proteinuria, unspecified: Secondary | ICD-10-CM | POA: Diagnosis not present

## 2020-06-24 DIAGNOSIS — D638 Anemia in other chronic diseases classified elsewhere: Secondary | ICD-10-CM | POA: Diagnosis not present

## 2020-07-01 DIAGNOSIS — M48062 Spinal stenosis, lumbar region with neurogenic claudication: Secondary | ICD-10-CM | POA: Diagnosis not present

## 2020-07-13 DIAGNOSIS — E1122 Type 2 diabetes mellitus with diabetic chronic kidney disease: Secondary | ICD-10-CM | POA: Diagnosis not present

## 2020-07-13 DIAGNOSIS — N184 Chronic kidney disease, stage 4 (severe): Secondary | ICD-10-CM | POA: Diagnosis not present

## 2020-07-13 DIAGNOSIS — K219 Gastro-esophageal reflux disease without esophagitis: Secondary | ICD-10-CM | POA: Diagnosis not present

## 2020-07-13 DIAGNOSIS — I129 Hypertensive chronic kidney disease with stage 1 through stage 4 chronic kidney disease, or unspecified chronic kidney disease: Secondary | ICD-10-CM | POA: Diagnosis not present

## 2020-08-03 ENCOUNTER — Encounter: Payer: Self-pay | Admitting: Cardiology

## 2020-08-03 ENCOUNTER — Ambulatory Visit: Payer: PPO | Admitting: Cardiology

## 2020-08-03 VITALS — BP 130/78 | HR 80 | Ht 66.0 in | Wt 230.2 lb

## 2020-08-03 DIAGNOSIS — I10 Essential (primary) hypertension: Secondary | ICD-10-CM | POA: Diagnosis not present

## 2020-08-03 DIAGNOSIS — R0789 Other chest pain: Secondary | ICD-10-CM

## 2020-08-03 NOTE — Progress Notes (Signed)
Clinical Summary Lauren Hamilton is a 75 y.o.female seen today for follow up of the following medical problems.   1. HTN - allergies clonidine, norvasc, lisinopril, losartan.  - more recently reported side effects to hydralazine, aldactone - issues with gout    - most recently has been on coreg, chlorthalidone.  - compliant with meds  2. Chest pain - ER visit 10/2019 with chest pain - troponins negative, no acute EKG changes. CXR no acute process  - reports chest pain that started that morning. Pressure left sided, 7/10 in severity. No other associated symptoms. Worst with deep breathing. Constant pain x 72 hours.  - she thinks it was related to a medication, she stopped taking clonidine and pain stopped.  -restarted clonidine few days later, pain returned.  - chest pain since last visit, less severe.  12/2019 nuclear stress test: no ischemia - some muscle like pains left chest at times.     3. History of PE - on xarelto - no bleeding on xarelto    Past Medical History:  Diagnosis Date  . Arthritis   . 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 Bronchitis   . Cancer (HCC)    Skin  . Diabetes mellitus without complication ([place])   . Gout   . H/O colonoscopy with polypectomy   . Hypercholesteremia   . Hypertension   . Renal disorder   . Renal insufficiency      Allergies  Allergen Reactions  . Other Itching    Unknown reaction  . Latex Rash    Reaction is only with internal contact Reaction is only with internal contact  . Clonidine Derivatives     Dizzy, dry mouth  . Etodolac     Unknown reaction  . Hydralazine   . Hydrocodone Other (See Comments)    Altered mental status-Hallucination, tolerated in low doses   . Naproxen Other (See Comments)    Damage to kidney   . Statins Other (See Comments)     Unknown reaction, tolerates lovastatin   . Tramadol     Patient, "Felt funny, didn't feel right, and felt weird."  . Amlodipine Cough  . Lisinopril Cough  . Losartan Cough     Current Outpatient Medications  Medication Sig Dispense Refill  . allopurinol (ZYLOPRIM) 100 MG tablet Take 300 mg by mouth at bedtime.     . Artificial Tear Solution (SOOTHE XP) SOLN Apply 1-2 drops to eye as needed (dry eyes).     [name] azelastine (ASTELIN) 0.1 % nasal spray Place 2 sprays into both nostrils 2 (two) times daily.    . carvedilol (COREG) 6.25 MG tablet Take 1 tablet (6.25 mg total) by mouth 2 (two) times daily. 180 tablet 3  . chlorthalidone (HYGROTON) 25 MG tablet Take 1 tablet (25 mg total) by mouth daily. 90 tablet 3  . colchicine-probenecid 0.5-500 MG tablet Take 1 tablet by mouth as directed.    . cyclobenzaprine (FLEXERIL) 10 MG tablet Take 5 mg by mouth 2 (two) times daily as needed for muscle spasms.     . ferrous sulfate 325 (65 FE) MG EC tablet Take 1 tablet by mouth daily.    . furosemide (LASIX) 20 MG tablet Take 1 tablet (20 mg total) by mouth daily as needed. 90 tablet 1  . omeprazole (PRILOSEC) 40 MG capsule Take 40 mg by  mouth daily.    . potassium chloride SA (KLOR-CON) 20 MEQ tablet Take 1 tablet by mouth daily.    . rivaroxaban (XARELTO) 20 MG TABS tablet Take 1 tablet (20 mg total) by mouth daily with supper. Start using after you complete the starter pack 30 tablet 3  . sodium chloride (OCEAN) 0.65 % SOLN nasal spray Place 1 spray into both nostrils as needed for congestion.    . SUMAtriptan (IMITREX) 100 MG tablet Take 1 tablet by mouth daily. Take 1 tab at the first sign of headache    . vitamin E 400 UNIT capsule Take 400 Units by mouth daily.     No current facility-administered medications for this visit.     Past Surgical History:  Procedure Laterality Date  . bilateral heel spur removed    . carpel tunnel Bilateral   . CHOLECYSTECTOMY    . COLONOSCOPY  2013   [facility]: hyperplastic polyps. advised 5 year follow up colonoscopy.   . COLONOSCOPY N/A 06/12/2018   Procedure: COLONOSCOPY;  Surgeon: [name], MD;  [facility];  Service: Endoscopy;  Laterality: N/A;  8:30am  . KNEE ARTHROSCOPY Right   . PARTIAL HYSTERECTOMY    . POLYPECTOMY  06/12/2018   Procedure: POLYPECTOMY;  Surgeon: [name], MD;  Location: [facility];  Service: Endoscopy;;  hep.flex  . THYROIDECTOMY, PARTIAL    . TONSILLECTOMY       Allergies  Allergen Reactions  . Other Itching    Unknown reaction  . Latex Rash    Reaction is only with internal contact Reaction is only with internal contact  . Clonidine Derivatives     Dizzy, dry mouth  . Etodolac     Unknown reaction  . Hydralazine   . Hydrocodone Other (See Comments)    Altered mental status-Hallucination, tolerated in low doses   . Naproxen Other (See Comments)    Damage to kidney   . Statins Other (See Comments)    Unknown reaction, tolerates lovastatin   . Tramadol     Patient, "Felt funny, didn't feel right, and felt weird."  . Amlodipine Cough  . Lisinopril Cough  . Losartan Cough      Family History  Problem Relation Age of Onset  . Allergic rhinitis Sister   . Allergic rhinitis Brother   . Hypertension Mother   . Heart attack Father   . Cirrhosis Father   . Emphysema Father   . Breast cancer Maternal Aunt   . Colon cancer Neg Hx      Social History Ms. Horine reports that she has never smoked. She has never used smokeless tobacco. Ms. Ganaway reports no history of alcohol use.   Review of Systems CONSTITUTIONAL: No weight loss, fever, chills, weakness or fatigue.  HEENT: Eyes: No visual loss, blurred vision, double vision or yellow sclerae.No hearing loss, sneezing, congestion, runny nose or sore throat.  SKIN: No rash or itching.  CARDIOVASCULAR: per hpi RESPIRATORY: No shortness of breath, cough or sputum.  GASTROINTESTINAL: No  anorexia, nausea, vomiting or diarrhea. No abdominal pain or blood.  GENITOURINARY: No burning on urination, no polyuria NEUROLOGICAL: No headache, dizziness, syncope, paralysis, ataxia, numbness or tingling in the extremities. No change in bowel or bladder control.  MUSCULOSKELETAL: No muscle, back pain, joint pain or stiffness.  LYMPHATICS: No enlarged nodes. No history of splenectomy.  PSYCHIATRIC: No history of depression or anxiety.  ENDOCRINOLOGIC: No reports of sweating, cold or  heat intolerance. No polyuria or polydipsia.  [name]   Physical Examination Today's Vitals   08/03/20 0915  BP: 130/78  Pulse: 80  SpO2: 98%  Weight: 230 lb 3.2 oz (104.4 kg)  Height: 5\' 6"  (1.676 m)   Body mass index is 37.16 kg/m.  Gen: resting comfortably, no acute distress HEENT: no scleral icterus, pupils equal round and reactive, no palptable cervical adenopathy,  CV: RRR, 2/6 systolic murmur rusb, no jvd Resp: Clear to auscultation bilaterally GI: abdomen is soft, non-tender, non-distended, normal bowel sounds, no hepatosplenomegaly MSK: extremities are warm, no edema.  Skin: warm, no rash Neuro:  no focal deficits Psych: appropriate affect   Diagnostic Studies 12/2019 nuclear stress  There was no ST segment deviation noted during stress.  The study is normal. There are no perfusion defects consistent with prior infarct or current ischemia.  This is a low risk study.  The left ventricular ejection fraction is normal (55-65%).     Assessment and Plan  1. HTN - multiple medication side effects as listed above - tolerating current regimen of chlorthalidone and coreg. Office bp at goal, some high home bp's - asked to check home bp's later in the day after morning meds and call us in 2 weeks, would titrate coreg if needed carefully given prior med side effects  2. Chest pain - some mild MSK pain at times, recent nuclear stress was benign.        Arnoldo Lenis, M.D.

## 2020-08-03 NOTE — Patient Instructions (Signed)
Medication Instructions:   Your physician recommends that you continue on your current medications as directed. Please refer to the Current Medication list given to you today.  Labwork:  none  Testing/Procedures:  none  Follow-Up:  Your physician recommends that you schedule a follow-up appointment in: 6 months.  Any Other Special Instructions Will Be Listed Below (If Applicable). Your physician has requested that you regularly monitor and record your blood pressure readings at home for 2 weeks. Please use the same machine at the same time of day to check your readings and record them. Call our office in 2 weeks with your readings or bring copy to office.   If you need a refill on your cardiac medications before your next appointment, please call your pharmacy. Blood Pressure Record Sheet To take your blood pressure, you will need a blood pressure machine. You can buy a blood pressure machine (blood pressure monitor) at your clinic, drug store, or online. When choosing one, consider:  An automatic monitor that has an arm cuff.  A cuff that wraps snugly around your upper arm. You should be able to fit only one finger between your arm and the cuff.  A device that stores blood pressure reading results.  Do not choose a monitor that measures your blood pressure from your wrist or finger. Follow your health care provider's instructions for how to take your blood pressure. To use this form:  Get one reading in the morning (a.m.) before you take any medicines.  Get one reading in the evening (p.m.) before supper.  Take at least 2 readings with each blood pressure check. This makes sure the results are correct. Wait 1-2 minutes between measurements.  Write down the results in the spaces on this form.  Repeat this once a week, or as told by your health care provider.  Make a follow-up appointment with your health care provider to discuss the results. Blood pressure log Date:  _______________________  a.m. _____________________(1st reading) _____________________(2nd reading)  p.m. _____________________(1st reading) _____________________(2nd reading) Date: _______________________  a.m. _____________________(1st reading) _____________________(2nd reading)  p.m. _____________________(1st reading) _____________________(2nd reading) Date: _______________________  a.m. _____________________(1st reading) _____________________(2nd reading)  p.m. _____________________(1st reading) _____________________(2nd reading) Date: _______________________  a.m. _____________________(1st reading) _____________________(2nd reading)  p.m. _____________________(1st reading) _____________________(2nd reading) Date: _______________________  a.m. _____________________(1st reading) _____________________(2nd reading)  p.m. _____________________(1st reading) _____________________(2nd reading) This information is not intended to replace advice given to you by your health care provider. Make sure you discuss any questions you have with your health care provider. Document Revised: 09/18/2019 Document Reviewed: 09/18/2019 Elsevier Patient Education  2021 [employer].

## 2020-08-10 DIAGNOSIS — I129 Hypertensive chronic kidney disease with stage 1 through stage 4 chronic kidney disease, or unspecified chronic kidney disease: Secondary | ICD-10-CM | POA: Diagnosis not present

## 2020-08-10 DIAGNOSIS — N184 Chronic kidney disease, stage 4 (severe): Secondary | ICD-10-CM | POA: Diagnosis not present

## 2020-08-10 DIAGNOSIS — E1122 Type 2 diabetes mellitus with diabetic chronic kidney disease: Secondary | ICD-10-CM | POA: Diagnosis not present

## 2020-08-12 DIAGNOSIS — E1122 Type 2 diabetes mellitus with diabetic chronic kidney disease: Secondary | ICD-10-CM | POA: Diagnosis not present

## 2020-08-12 DIAGNOSIS — Z6837 Body mass index (BMI) 37.0-37.9, adult: Secondary | ICD-10-CM | POA: Diagnosis not present

## 2020-08-12 DIAGNOSIS — Z2821 Immunization not carried out because of patient refusal: Secondary | ICD-10-CM | POA: Diagnosis not present

## 2020-08-12 DIAGNOSIS — Z299 Encounter for prophylactic measures, unspecified: Secondary | ICD-10-CM | POA: Diagnosis not present

## 2020-08-12 DIAGNOSIS — I10 Essential (primary) hypertension: Secondary | ICD-10-CM | POA: Diagnosis not present

## 2020-08-12 DIAGNOSIS — E1165 Type 2 diabetes mellitus with hyperglycemia: Secondary | ICD-10-CM | POA: Diagnosis not present

## 2020-08-12 DIAGNOSIS — E1140 Type 2 diabetes mellitus with diabetic neuropathy, unspecified: Secondary | ICD-10-CM | POA: Diagnosis not present

## 2020-08-12 DIAGNOSIS — N184 Chronic kidney disease, stage 4 (severe): Secondary | ICD-10-CM | POA: Diagnosis not present

## 2020-08-20 ENCOUNTER — Telehealth: Payer: Self-pay | Admitting: Cardiology

## 2020-08-20 NOTE — Telephone Encounter (Signed)
Wanting to know what she should do concerning her BP readings and her medication. Stated she dropped off her readings on Monday and hasn't heard anything.   Please call [phone]

## 2020-08-20 NOTE — Telephone Encounter (Signed)
Patient informed and verbalized understanding of plan. 

## 2020-08-20 NOTE — Telephone Encounter (Signed)
Sorry for delay, was in [place] part of week. Overall bp's look ok, rarely they are mildly elevated. For now would continue current meds  Zandra Abts MD

## 2020-08-21 DIAGNOSIS — I129 Hypertensive chronic kidney disease with stage 1 through stage 4 chronic kidney disease, or unspecified chronic kidney disease: Secondary | ICD-10-CM | POA: Diagnosis not present

## 2020-08-21 DIAGNOSIS — N183 Chronic kidney disease, stage 3 unspecified: Secondary | ICD-10-CM | POA: Diagnosis not present

## 2020-08-21 DIAGNOSIS — M1712 Unilateral primary osteoarthritis, left knee: Secondary | ICD-10-CM | POA: Diagnosis not present

## 2020-08-21 DIAGNOSIS — M48062 Spinal stenosis, lumbar region with neurogenic claudication: Secondary | ICD-10-CM | POA: Diagnosis not present

## 2020-09-09 DIAGNOSIS — E1122 Type 2 diabetes mellitus with diabetic chronic kidney disease: Secondary | ICD-10-CM | POA: Diagnosis not present

## 2020-09-09 DIAGNOSIS — I10 Essential (primary) hypertension: Secondary | ICD-10-CM | POA: Diagnosis not present

## 2020-09-09 DIAGNOSIS — N184 Chronic kidney disease, stage 4 (severe): Secondary | ICD-10-CM | POA: Diagnosis not present

## 2020-09-15 DIAGNOSIS — L570 Actinic keratosis: Secondary | ICD-10-CM | POA: Diagnosis not present

## 2020-09-17 DIAGNOSIS — M7051 Other bursitis of knee, right knee: Secondary | ICD-10-CM | POA: Diagnosis not present

## 2020-09-17 DIAGNOSIS — M1712 Unilateral primary osteoarthritis, left knee: Secondary | ICD-10-CM | POA: Diagnosis not present

## 2020-09-17 DIAGNOSIS — G4733 Obstructive sleep apnea (adult) (pediatric): Secondary | ICD-10-CM | POA: Diagnosis not present

## 2020-09-23 DIAGNOSIS — N189 Chronic kidney disease, unspecified: Secondary | ICD-10-CM | POA: Diagnosis not present

## 2020-09-23 DIAGNOSIS — E1129 Type 2 diabetes mellitus with other diabetic kidney complication: Secondary | ICD-10-CM | POA: Diagnosis not present

## 2020-09-23 DIAGNOSIS — D638 Anemia in other chronic diseases classified elsewhere: Secondary | ICD-10-CM | POA: Diagnosis not present

## 2020-09-23 DIAGNOSIS — E1122 Type 2 diabetes mellitus with diabetic chronic kidney disease: Secondary | ICD-10-CM | POA: Diagnosis not present

## 2020-09-23 DIAGNOSIS — R809 Proteinuria, unspecified: Secondary | ICD-10-CM | POA: Diagnosis not present

## 2020-09-23 DIAGNOSIS — I129 Hypertensive chronic kidney disease with stage 1 through stage 4 chronic kidney disease, or unspecified chronic kidney disease: Secondary | ICD-10-CM | POA: Diagnosis not present

## 2020-09-30 DIAGNOSIS — E1122 Type 2 diabetes mellitus with diabetic chronic kidney disease: Secondary | ICD-10-CM | POA: Diagnosis not present

## 2020-09-30 DIAGNOSIS — I129 Hypertensive chronic kidney disease with stage 1 through stage 4 chronic kidney disease, or unspecified chronic kidney disease: Secondary | ICD-10-CM | POA: Diagnosis not present

## 2020-09-30 DIAGNOSIS — E1129 Type 2 diabetes mellitus with other diabetic kidney complication: Secondary | ICD-10-CM | POA: Diagnosis not present

## 2020-09-30 DIAGNOSIS — R809 Proteinuria, unspecified: Secondary | ICD-10-CM | POA: Diagnosis not present

## 2020-09-30 DIAGNOSIS — E611 Iron deficiency: Secondary | ICD-10-CM | POA: Diagnosis not present

## 2020-09-30 DIAGNOSIS — N189 Chronic kidney disease, unspecified: Secondary | ICD-10-CM | POA: Diagnosis not present

## 2020-10-07 DIAGNOSIS — I10 Essential (primary) hypertension: Secondary | ICD-10-CM | POA: Diagnosis not present

## 2020-10-07 DIAGNOSIS — E1165 Type 2 diabetes mellitus with hyperglycemia: Secondary | ICD-10-CM | POA: Diagnosis not present

## 2020-10-07 DIAGNOSIS — Z299 Encounter for prophylactic measures, unspecified: Secondary | ICD-10-CM | POA: Diagnosis not present

## 2020-10-07 DIAGNOSIS — J329 Chronic sinusitis, unspecified: Secondary | ICD-10-CM | POA: Diagnosis not present

## 2020-10-07 DIAGNOSIS — G43909 Migraine, unspecified, not intractable, without status migrainosus: Secondary | ICD-10-CM | POA: Diagnosis not present

## 2020-10-19 ENCOUNTER — Other Ambulatory Visit: Payer: Self-pay | Admitting: Cardiology

## 2020-10-27 DIAGNOSIS — J329 Chronic sinusitis, unspecified: Secondary | ICD-10-CM | POA: Diagnosis not present

## 2020-10-27 DIAGNOSIS — Z6837 Body mass index (BMI) 37.0-37.9, adult: Secondary | ICD-10-CM | POA: Diagnosis not present

## 2020-10-27 DIAGNOSIS — Z789 Other specified health status: Secondary | ICD-10-CM | POA: Diagnosis not present

## 2020-10-27 DIAGNOSIS — Z6834 Body mass index (BMI) 34.0-34.9, adult: Secondary | ICD-10-CM | POA: Diagnosis not present

## 2020-10-27 DIAGNOSIS — E1122 Type 2 diabetes mellitus with diabetic chronic kidney disease: Secondary | ICD-10-CM | POA: Diagnosis not present

## 2020-10-27 DIAGNOSIS — Z299 Encounter for prophylactic measures, unspecified: Secondary | ICD-10-CM | POA: Diagnosis not present

## 2020-10-27 DIAGNOSIS — E7800 Pure hypercholesterolemia, unspecified: Secondary | ICD-10-CM | POA: Diagnosis not present

## 2020-11-09 DIAGNOSIS — G252 Other specified forms of tremor: Secondary | ICD-10-CM | POA: Diagnosis not present

## 2020-11-09 DIAGNOSIS — I10 Essential (primary) hypertension: Secondary | ICD-10-CM | POA: Diagnosis not present

## 2020-11-09 DIAGNOSIS — I809 Phlebitis and thrombophlebitis of unspecified site: Secondary | ICD-10-CM | POA: Diagnosis not present

## 2020-11-09 DIAGNOSIS — E7800 Pure hypercholesterolemia, unspecified: Secondary | ICD-10-CM | POA: Diagnosis not present

## 2020-11-16 DIAGNOSIS — Z299 Encounter for prophylactic measures, unspecified: Secondary | ICD-10-CM | POA: Diagnosis not present

## 2020-11-16 DIAGNOSIS — J309 Allergic rhinitis, unspecified: Secondary | ICD-10-CM | POA: Diagnosis not present

## 2020-11-16 DIAGNOSIS — N184 Chronic kidney disease, stage 4 (severe): Secondary | ICD-10-CM | POA: Diagnosis not present

## 2020-11-16 DIAGNOSIS — I10 Essential (primary) hypertension: Secondary | ICD-10-CM | POA: Diagnosis not present

## 2020-11-16 DIAGNOSIS — E1165 Type 2 diabetes mellitus with hyperglycemia: Secondary | ICD-10-CM | POA: Diagnosis not present

## 2020-12-22 DIAGNOSIS — G4733 Obstructive sleep apnea (adult) (pediatric): Secondary | ICD-10-CM | POA: Diagnosis not present

## 2020-12-23 DIAGNOSIS — E1122 Type 2 diabetes mellitus with diabetic chronic kidney disease: Secondary | ICD-10-CM | POA: Diagnosis not present

## 2020-12-23 DIAGNOSIS — E611 Iron deficiency: Secondary | ICD-10-CM | POA: Diagnosis not present

## 2020-12-23 DIAGNOSIS — I129 Hypertensive chronic kidney disease with stage 1 through stage 4 chronic kidney disease, or unspecified chronic kidney disease: Secondary | ICD-10-CM | POA: Diagnosis not present

## 2020-12-23 DIAGNOSIS — R809 Proteinuria, unspecified: Secondary | ICD-10-CM | POA: Diagnosis not present

## 2020-12-23 DIAGNOSIS — E1129 Type 2 diabetes mellitus with other diabetic kidney complication: Secondary | ICD-10-CM | POA: Diagnosis not present

## 2020-12-23 DIAGNOSIS — N189 Chronic kidney disease, unspecified: Secondary | ICD-10-CM | POA: Diagnosis not present

## 2020-12-25 DIAGNOSIS — H524 Presbyopia: Secondary | ICD-10-CM | POA: Diagnosis not present

## 2020-12-25 DIAGNOSIS — E119 Type 2 diabetes mellitus without complications: Secondary | ICD-10-CM | POA: Diagnosis not present

## 2020-12-25 DIAGNOSIS — H5203 Hypermetropia, bilateral: Secondary | ICD-10-CM | POA: Diagnosis not present

## 2020-12-25 DIAGNOSIS — M1712 Unilateral primary osteoarthritis, left knee: Secondary | ICD-10-CM | POA: Diagnosis not present

## 2020-12-25 DIAGNOSIS — M7051 Other bursitis of knee, right knee: Secondary | ICD-10-CM | POA: Diagnosis not present

## 2020-12-30 ENCOUNTER — Other Ambulatory Visit (HOSPITAL_COMMUNITY): Payer: Self-pay | Admitting: Nephrology

## 2020-12-30 ENCOUNTER — Other Ambulatory Visit: Payer: Self-pay | Admitting: Nephrology

## 2020-12-30 DIAGNOSIS — R600 Localized edema: Secondary | ICD-10-CM | POA: Diagnosis not present

## 2020-12-30 DIAGNOSIS — I129 Hypertensive chronic kidney disease with stage 1 through stage 4 chronic kidney disease, or unspecified chronic kidney disease: Secondary | ICD-10-CM | POA: Diagnosis not present

## 2020-12-30 DIAGNOSIS — N170 Acute kidney failure with tubular necrosis: Secondary | ICD-10-CM

## 2020-12-30 DIAGNOSIS — E611 Iron deficiency: Secondary | ICD-10-CM | POA: Diagnosis not present

## 2020-12-30 DIAGNOSIS — E1122 Type 2 diabetes mellitus with diabetic chronic kidney disease: Secondary | ICD-10-CM | POA: Diagnosis not present

## 2020-12-30 DIAGNOSIS — E1129 Type 2 diabetes mellitus with other diabetic kidney complication: Secondary | ICD-10-CM | POA: Diagnosis not present

## 2020-12-30 DIAGNOSIS — D638 Anemia in other chronic diseases classified elsewhere: Secondary | ICD-10-CM

## 2020-12-30 DIAGNOSIS — N189 Chronic kidney disease, unspecified: Secondary | ICD-10-CM | POA: Diagnosis not present

## 2020-12-30 DIAGNOSIS — R809 Proteinuria, unspecified: Secondary | ICD-10-CM | POA: Diagnosis not present

## 2020-12-30 DIAGNOSIS — E876 Hypokalemia: Secondary | ICD-10-CM | POA: Diagnosis not present

## 2020-12-30 DIAGNOSIS — N184 Chronic kidney disease, stage 4 (severe): Secondary | ICD-10-CM

## 2021-01-06 ENCOUNTER — Other Ambulatory Visit: Payer: Self-pay

## 2021-01-06 ENCOUNTER — Ambulatory Visit (HOSPITAL_COMMUNITY)
Admission: RE | Admit: 2021-01-06 | Discharge: 2021-01-06 | Disposition: A | Discharge status: Home / Self Care | Payer: PPO | Source: Ambulatory Visit | Attending: Nephrology | Admitting: Nephrology

## 2021-01-06 DIAGNOSIS — E611 Iron deficiency: Secondary | ICD-10-CM | POA: Diagnosis not present

## 2021-01-06 DIAGNOSIS — N184 Chronic kidney disease, stage 4 (severe): Secondary | ICD-10-CM | POA: Insufficient documentation

## 2021-01-06 DIAGNOSIS — R600 Localized edema: Secondary | ICD-10-CM | POA: Diagnosis not present

## 2021-01-06 DIAGNOSIS — D638 Anemia in other chronic diseases classified elsewhere: Secondary | ICD-10-CM

## 2021-01-06 DIAGNOSIS — N170 Acute kidney failure with tubular necrosis: Secondary | ICD-10-CM

## 2021-01-06 DIAGNOSIS — E876 Hypokalemia: Secondary | ICD-10-CM | POA: Diagnosis not present

## 2021-01-06 DIAGNOSIS — I129 Hypertensive chronic kidney disease with stage 1 through stage 4 chronic kidney disease, or unspecified chronic kidney disease: Secondary | ICD-10-CM | POA: Diagnosis not present

## 2021-01-06 DIAGNOSIS — E1122 Type 2 diabetes mellitus with diabetic chronic kidney disease: Secondary | ICD-10-CM | POA: Diagnosis not present

## 2021-01-06 DIAGNOSIS — K7689 Other specified diseases of liver: Secondary | ICD-10-CM | POA: Diagnosis not present

## 2021-01-06 DIAGNOSIS — N189 Chronic kidney disease, unspecified: Secondary | ICD-10-CM | POA: Diagnosis not present

## 2021-01-06 DIAGNOSIS — R809 Proteinuria, unspecified: Secondary | ICD-10-CM | POA: Diagnosis not present

## 2021-01-06 DIAGNOSIS — E1129 Type 2 diabetes mellitus with other diabetic kidney complication: Secondary | ICD-10-CM | POA: Diagnosis not present

## 2021-01-07 DIAGNOSIS — I129 Hypertensive chronic kidney disease with stage 1 through stage 4 chronic kidney disease, or unspecified chronic kidney disease: Secondary | ICD-10-CM | POA: Diagnosis not present

## 2021-01-07 DIAGNOSIS — E876 Hypokalemia: Secondary | ICD-10-CM | POA: Diagnosis not present

## 2021-01-07 DIAGNOSIS — R600 Localized edema: Secondary | ICD-10-CM | POA: Diagnosis not present

## 2021-01-15 ENCOUNTER — Other Ambulatory Visit (HOSPITAL_COMMUNITY): Payer: Self-pay | Admitting: Nephrology

## 2021-01-15 DIAGNOSIS — E611 Iron deficiency: Secondary | ICD-10-CM | POA: Diagnosis not present

## 2021-01-15 DIAGNOSIS — I129 Hypertensive chronic kidney disease with stage 1 through stage 4 chronic kidney disease, or unspecified chronic kidney disease: Secondary | ICD-10-CM | POA: Diagnosis not present

## 2021-01-15 DIAGNOSIS — E1122 Type 2 diabetes mellitus with diabetic chronic kidney disease: Secondary | ICD-10-CM | POA: Diagnosis not present

## 2021-01-15 DIAGNOSIS — R809 Proteinuria, unspecified: Secondary | ICD-10-CM | POA: Diagnosis not present

## 2021-01-15 DIAGNOSIS — E876 Hypokalemia: Secondary | ICD-10-CM | POA: Diagnosis not present

## 2021-01-15 DIAGNOSIS — R600 Localized edema: Secondary | ICD-10-CM | POA: Diagnosis not present

## 2021-01-15 DIAGNOSIS — N189 Chronic kidney disease, unspecified: Secondary | ICD-10-CM | POA: Diagnosis not present

## 2021-01-15 DIAGNOSIS — I10 Essential (primary) hypertension: Secondary | ICD-10-CM

## 2021-01-15 DIAGNOSIS — E1129 Type 2 diabetes mellitus with other diabetic kidney complication: Secondary | ICD-10-CM | POA: Diagnosis not present

## 2021-01-20 DIAGNOSIS — E1129 Type 2 diabetes mellitus with other diabetic kidney complication: Secondary | ICD-10-CM | POA: Diagnosis not present

## 2021-01-20 DIAGNOSIS — N189 Chronic kidney disease, unspecified: Secondary | ICD-10-CM | POA: Diagnosis not present

## 2021-01-20 DIAGNOSIS — R809 Proteinuria, unspecified: Secondary | ICD-10-CM | POA: Diagnosis not present

## 2021-01-20 DIAGNOSIS — E876 Hypokalemia: Secondary | ICD-10-CM | POA: Diagnosis not present

## 2021-01-20 DIAGNOSIS — I129 Hypertensive chronic kidney disease with stage 1 through stage 4 chronic kidney disease, or unspecified chronic kidney disease: Secondary | ICD-10-CM | POA: Diagnosis not present

## 2021-01-20 DIAGNOSIS — E611 Iron deficiency: Secondary | ICD-10-CM | POA: Diagnosis not present

## 2021-01-20 DIAGNOSIS — G4733 Obstructive sleep apnea (adult) (pediatric): Secondary | ICD-10-CM | POA: Diagnosis not present

## 2021-01-20 DIAGNOSIS — E1122 Type 2 diabetes mellitus with diabetic chronic kidney disease: Secondary | ICD-10-CM | POA: Diagnosis not present

## 2021-02-03 DIAGNOSIS — L570 Actinic keratosis: Secondary | ICD-10-CM | POA: Diagnosis not present

## 2021-02-05 ENCOUNTER — Ambulatory Visit (HOSPITAL_COMMUNITY)
Admission: RE | Admit: 2021-02-05 | Discharge: 2021-02-05 | Disposition: A | Discharge status: Home / Self Care | Payer: PPO | Source: Ambulatory Visit | Attending: Nephrology | Admitting: Nephrology

## 2021-02-05 ENCOUNTER — Other Ambulatory Visit: Payer: Self-pay

## 2021-02-05 DIAGNOSIS — Z0000 Encounter for general adult medical examination without abnormal findings: Secondary | ICD-10-CM | POA: Diagnosis not present

## 2021-02-05 DIAGNOSIS — E611 Iron deficiency: Secondary | ICD-10-CM | POA: Diagnosis not present

## 2021-02-05 DIAGNOSIS — R5383 Other fatigue: Secondary | ICD-10-CM | POA: Diagnosis not present

## 2021-02-05 DIAGNOSIS — R809 Proteinuria, unspecified: Secondary | ICD-10-CM | POA: Diagnosis not present

## 2021-02-05 DIAGNOSIS — G43909 Migraine, unspecified, not intractable, without status migrainosus: Secondary | ICD-10-CM | POA: Diagnosis not present

## 2021-02-05 DIAGNOSIS — N189 Chronic kidney disease, unspecified: Secondary | ICD-10-CM | POA: Diagnosis not present

## 2021-02-05 DIAGNOSIS — I129 Hypertensive chronic kidney disease with stage 1 through stage 4 chronic kidney disease, or unspecified chronic kidney disease: Secondary | ICD-10-CM | POA: Diagnosis not present

## 2021-02-05 DIAGNOSIS — E876 Hypokalemia: Secondary | ICD-10-CM | POA: Diagnosis not present

## 2021-02-05 DIAGNOSIS — I10 Essential (primary) hypertension: Secondary | ICD-10-CM | POA: Diagnosis not present

## 2021-02-05 DIAGNOSIS — Z1339 Encounter for screening examination for other mental health and behavioral disorders: Secondary | ICD-10-CM | POA: Diagnosis not present

## 2021-02-05 DIAGNOSIS — Z6838 Body mass index (BMI) 38.0-38.9, adult: Secondary | ICD-10-CM | POA: Diagnosis not present

## 2021-02-05 DIAGNOSIS — E1129 Type 2 diabetes mellitus with other diabetic kidney complication: Secondary | ICD-10-CM | POA: Diagnosis not present

## 2021-02-05 DIAGNOSIS — Z79899 Other long term (current) drug therapy: Secondary | ICD-10-CM | POA: Diagnosis not present

## 2021-02-05 DIAGNOSIS — Z7189 Other specified counseling: Secondary | ICD-10-CM | POA: Diagnosis not present

## 2021-02-05 DIAGNOSIS — Z1331 Encounter for screening for depression: Secondary | ICD-10-CM | POA: Diagnosis not present

## 2021-02-05 DIAGNOSIS — G4733 Obstructive sleep apnea (adult) (pediatric): Secondary | ICD-10-CM | POA: Diagnosis not present

## 2021-02-05 DIAGNOSIS — Z713 Dietary counseling and surveillance: Secondary | ICD-10-CM | POA: Diagnosis not present

## 2021-02-05 DIAGNOSIS — Z299 Encounter for prophylactic measures, unspecified: Secondary | ICD-10-CM | POA: Diagnosis not present

## 2021-02-05 DIAGNOSIS — E1122 Type 2 diabetes mellitus with diabetic chronic kidney disease: Secondary | ICD-10-CM | POA: Diagnosis not present

## 2021-02-05 DIAGNOSIS — E7800 Pure hypercholesterolemia, unspecified: Secondary | ICD-10-CM | POA: Diagnosis not present

## 2021-02-05 DIAGNOSIS — J309 Allergic rhinitis, unspecified: Secondary | ICD-10-CM | POA: Diagnosis not present

## 2021-02-05 LAB — ECHOCARDIOGRAM COMPLETE
AR max vel: 1.68 cm2
AV Area VTI: 2.06 cm2
AV Area mean vel: 1.59 cm2
AV Mean grad: 5.3 mmHg
AV Peak grad: 11.1 mmHg
Ao pk vel: 1.67 m/s
Area-P 1/2: 2.91 cm2
S' Lateral: 2.8 cm

## 2021-02-05 NOTE — Progress Notes (Signed)
  Echocardiogram 2D Echocardiogram has been performed.  Lauren Hamilton 02/05/2021, 11:55 AM

## 2021-02-11 DIAGNOSIS — E876 Hypokalemia: Secondary | ICD-10-CM | POA: Diagnosis not present

## 2021-02-11 DIAGNOSIS — E1122 Type 2 diabetes mellitus with diabetic chronic kidney disease: Secondary | ICD-10-CM | POA: Diagnosis not present

## 2021-02-11 DIAGNOSIS — E611 Iron deficiency: Secondary | ICD-10-CM | POA: Diagnosis not present

## 2021-02-11 DIAGNOSIS — E1129 Type 2 diabetes mellitus with other diabetic kidney complication: Secondary | ICD-10-CM | POA: Diagnosis not present

## 2021-02-11 DIAGNOSIS — N189 Chronic kidney disease, unspecified: Secondary | ICD-10-CM | POA: Diagnosis not present

## 2021-02-11 DIAGNOSIS — N170 Acute kidney failure with tubular necrosis: Secondary | ICD-10-CM | POA: Diagnosis not present

## 2021-02-11 DIAGNOSIS — I5032 Chronic diastolic (congestive) heart failure: Secondary | ICD-10-CM | POA: Diagnosis not present

## 2021-02-11 DIAGNOSIS — I129 Hypertensive chronic kidney disease with stage 1 through stage 4 chronic kidney disease, or unspecified chronic kidney disease: Secondary | ICD-10-CM | POA: Diagnosis not present

## 2021-02-11 DIAGNOSIS — R809 Proteinuria, unspecified: Secondary | ICD-10-CM | POA: Diagnosis not present

## 2021-02-22 ENCOUNTER — Telehealth: Payer: Self-pay | Admitting: Cardiology

## 2021-02-22 ENCOUNTER — Ambulatory Visit (INDEPENDENT_AMBULATORY_CARE_PROVIDER_SITE_OTHER): Payer: PPO | Admitting: Cardiology

## 2021-02-22 ENCOUNTER — Encounter: Payer: Self-pay | Admitting: Cardiology

## 2021-02-22 VITALS — BP 121/81 | HR 95 | Ht 66.0 in | Wt 233.0 lb

## 2021-02-22 DIAGNOSIS — I10 Essential (primary) hypertension: Secondary | ICD-10-CM

## 2021-02-22 DIAGNOSIS — R600 Localized edema: Secondary | ICD-10-CM

## 2021-02-22 NOTE — Progress Notes (Signed)
Virtual Visit via Telephone Note   This visit type was conducted due to national recommendations for restrictions regarding the COVID-19 Pandemic (e.g. social distancing) in an effort to limit this patient's exposure and mitigate transmission in our community.  Due to her co-morbid illnesses, this patient is at least at moderate risk for complications without adequate follow up.  This format is felt to be most appropriate for this patient at this time.  The patient did not have access to video technology/had technical difficulties with video requiring transitioning to audio format only (telephone).  All issues noted in this document were discussed and addressed.  No physical exam could be performed with this format.  Please refer to the patient's chart for her  consent to telehealth for [facility].    Date:  02/22/2021   ID:  Lauren Hamilton, DOB 05/15/1946, MRN [id number] The patient was identified using 2 identifiers.  Patient Location: Home Provider Location: Office/Clinic   PCP:  Monico Blitz, [facility] Providers Cardiologist:  Carlyle Dolly, MD     Evaluation Performed:  Follow-Up Visit  Chief Complaint:  Follow up  History of Present Illness:    Lauren Hamilton is a 75 y.o. female seen today for follow up of the following medical problems.    1. HTN - allergies clonidine, norvasc, lisinopril, losartan.  - more recently reported side effects to hydralazine, aldactone - issues with gout       - most recently has been on coreg, chlorthalidone.  - renal increased coreg to 12.5mg  bid    2. Chest pain - ER visit 10/2019 with chest pain - troponins negative, no acute  EKG changes. CXR no acute process   - reports chest pain that started that morning. Pressure left sided, 7/10 in severity. No other associated symptoms. Worst with deep breathing. Constant pain x 72 hours.  - she thinks it was related to a medication, she stopped taking clonidine and pain  stopped.  -restarted clonidine few days later, pain returned.    - chest pain since last visit, less severe.     12/2019 nuclear stress test: no ischemia -no recent chest pains    3. 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 advised 5 year follow up colonoscopy.    COLONOSCOPY N/A 06/12/2018   Procedure: COLONOSCOPY;  Surgeon: [name],  MD;  [facility];  Service: Endoscopy;  Laterality: N/A;  8:30am   KNEE ARTHROSCOPY Right    PARTIAL HYSTERECTOMY     POLYPECTOMY  06/12/2018   Procedure: POLYPECTOMY;  Surgeon: [name], MD;  [facility];  Service: Endoscopy;;  hep.flex   THYROIDECTOMY, PARTIAL     TONSILLECTOMY       Current Meds  Medication Sig   allopurinol (ZYLOPRIM) 100 MG tablet Take 300 mg by mouth at bedtime.    Artificial Tear Solution (SOOTHE XP) SOLN Apply 1-2 drops to eye as needed (dry eyes).    azelastine (ASTELIN) 0.1 % nasal spray Place 2 sprays  into both nostrils 2 (two) times daily.   carvedilol (COREG) 12.5 MG tablet Take 12.5 mg by mouth 2 (two) times daily with a meal.   chlorthalidone (HYGROTON) 25 MG tablet Take 25 mg by mouth daily.   colchicine-probenecid 0.5-500 MG tablet Take 1 tablet by mouth as directed.   cyclobenzaprine (FLEXERIL) 10 MG tablet Take 5 mg by mouth 2 (two) times daily as needed for muscle spasms.    DULoxetine (CYMBALTA) 30 MG capsule Take 30 mg by mouth daily.   ferrous sulfate 325 (65 FE) MG tablet Take 325 mg by mouth every 14 (fourteen) days.   furosemide (LASIX) 20 MG tablet Take 20 mg by mouth 2 (two) times daily.   levocetirizine (XYZAL) 5 MG tablet Take 5 mg by mouth every evening.   omeprazole (PRILOSEC) 40 MG capsule Take 40 mg by mouth daily.   potassium chloride 20 MEQ/15ML (10%) SOLN Take 20 mEq by mouth 2 (two) times daily.   rivaroxaban (XARELTO) 20 MG TABS tablet Take 1 tablet (20 mg total) by mouth daily with supper. Start using after you complete the starter pack   sodium chloride (OCEAN) 0.65 % SOLN nasal spray Place 1 spray into both nostrils as needed for congestion.   spironolactone (ALDACTONE) 25 MG tablet Take 12.5 mg by mouth every evening.   SUMAtriptan (IMITREX) 100 MG tablet Take 1 tablet by mouth daily. Take 1 tab at the first sign of headache   vitamin E 400 UNIT capsule Take 400 Units by mouth daily.     Allergies:   Other, Latex, Clonidine derivatives, Etodolac, Hydralazine, Hydrocodone, Naproxen, Statins, Tramadol, Amlodipine, Lisinopril, and Losartan   Social History   Tobacco Use   Smoking status: Never   Smokeless tobacco: Never  Vaping Use   Vaping Use: Never used  Substance Use Topics   Alcohol use: Never   Drug use: Never     Family Hx: The patient's family history includes Allergic rhinitis in her brother and sister; Breast cancer in her maternal aunt; Cirrhosis in her father; Emphysema in her father; Heart attack in her father; Hypertension in her mother.  There is no history of Colon cancer.  ROS:   Please see the history of present illness.     All other systems reviewed and are negative.   Prior CV studies:   The following studies were reviewed today:  12/2019 nuclear stress There was no ST segment deviation noted during stress. The study is normal. There are no perfusion defects consistent with prior infarct or current ischemia. This is a low risk study. The left ventricular ejection fraction is normal (55-65%).   01/2021 echo IMPRESSIONS     1. Left ventricular ejection fraction, by estimation, is 60 to 65%. The  left ventricle has normal function. The left ventricle has no regional  wall motion abnormalities. There is moderate left ventricular hypertrophy.  Left ventricular diastolic  parameters are consistent with Grade I diastolic dysfunction (impaired  relaxation).   2. Right ventricular systolic function is normal. The right ventricular  size is normal. There is normal pulmonary artery systolic pressure.   3. The mitral valve is normal in structure. No evidence of mitral valve  regurgitation. No evidence of mitral stenosis.   4. The aortic valve is tricuspid. Aortic valve regurgitation is not  visualized. No aortic stenosis is present.   5. The inferior vena cava is normal in size with greater than 50%  respiratory variability, suggesting right atrial pressure of 3 mmHg.   Labs/Other Tests and Data Reviewed:    EKG:  No ECG reviewed.  Recent Labs: 03/11/2020: BUN 26; Creat 1.27; Magnesium 2.0; Potassium 4.2; Sodium 138; TSH 1.68   Recent Lipid Panel No results found for: CHOL, TRIG, HDL, CHOLHDL, LDLCALC, LDLDIRECT  Wt Readings from Last 3 Encounters:  02/22/21 233 lb (105.7 kg)  08/03/20 230 lb 3.2 oz (104.4 kg)  03/30/20 225 lb 6.4 oz (102.2 kg)     Risk Assessment/Calculations:          Objective:    Vital Signs:  BP 121/81   Pulse 95   Ht 5\' 6"  (1.676 m)   Wt 233 lb (105.7 kg)   BMI 37.61 kg/m     Normal affect. Normal speech pattern and tone. Comfortable, no apparent distress. No audible signs of sob or wheezing.   ASSESSMENT & PLAN:    1. HTN - multiple medication side effects as listed above - tolerating current regimen of chlorthalidone and coreg.  - home bp's at goal, continue current meds   2. LE edema - recent fairly benign echo. Normal LVEF, just mild diastolic dysfunction - swelling likely combination of obesity, venous insufficiency. No indciation for additional cardiac therapies - defere diuretic dosing to nephrology. Continue compression stockings.         COVID-19 Education: The signs and symptoms of COVID-19 were discussed with the patient and how to seek care for testing (follow up with PCP or arrange E-visit).  The importance of social distancing was discussed today.  Time:   Today, I have spent 17 minutes with the patient with telehealth technology discussing the above problems.     Medication Adjustments/Labs and Tests Ordered: Current medicines are reviewed at length with the patient today.  Concerns regarding medicines are outlined above.   Tests Ordered: No orders of the defined types were placed in this encounter.   Medication Changes: No orders of the defined types were placed in this encounter.   Follow Up:  f/u 6 months  Signed, Carlyle Dolly, MD  02/22/2021 10:40 AM    [place]

## 2021-02-22 NOTE — Telephone Encounter (Signed)
  Patient Consent for Virtual Visit    Lauren Hamilton has provided verbal consent on 02/22/2021 for a virtual visit (video or telephone).   CONSENT FOR VIRTUAL VISIT FOR:  Lauren Hamilton  By participating in this virtual visit I agree to the following:  I hereby voluntarily request, consent and authorize [facility] and its employed or contracted physicians, physician assistants, nurse practitioners or other licensed health care professionals (the Practitioner), to provide me with telemedicine health care services (the "Services") as deemed necessary by the treating Practitioner. I acknowledge and consent to receive the Services by the Practitioner via telemedicine. I understand that the telemedicine visit will involve communicating with the Practitioner through live audiovisual communication technology and the disclosure of certain medical information by electronic transmission. I acknowledge that I have been given the opportunity to request an in-person assessment or other available alternative prior to the telemedicine visit and am voluntarily participating in the telemedicine visit.  I understand that I have the right to withhold or withdraw my consent to the use of telemedicine in the course of my care at any time, without affecting my right to future care or treatment, and that the Practitioner or I may terminate the telemedicine visit at any time. I understand that I have the right to inspect all information obtained and/or recorded in the course of the telemedicine visit and may receive copies of available information for a reasonable fee.  I understand that some of the potential risks of receiving the Services via telemedicine include:  Delay or interruption in medical evaluation due to technological equipment failure or disruption; Information transmitted may not be sufficient (e.g. poor resolution of images) to allow for appropriate medical decision making by the Practitioner; and/or   In rare instances, security protocols could fail, causing a breach of personal health information.  Furthermore, I acknowledge that it is my responsibility to provide information about my medical history, conditions and care that is complete and accurate to the best of my ability. I acknowledge that Practitioner's advice, recommendations, and/or decision may be based on factors not within their control, such as incomplete or inaccurate data provided by me or distortions of diagnostic images or specimens that may result from electronic transmissions. I understand that the practice of medicine is not an exact science and that Practitioner makes no warranties or guarantees regarding treatment outcomes. I acknowledge that a copy of this consent can be made available to me via my patient portal ([place]), or I can request a printed copy by calling the office of [facility].    I understand that my insurance will be billed for this visit.   I have read or had this consent read to me. I understand the contents of this consent, which adequately explains the benefits and risks of the Services being provided via telemedicine.  I have been provided ample opportunity to ask questions regarding this consent and the Services and have had my questions answered to my satisfaction. I give my informed consent for the services to be provided through the use of telemedicine in my medical care

## 2021-02-22 NOTE — Patient Instructions (Signed)
Medication Instructions:  Continue all current medications.   Labwork: none  Testing/Procedures: none  Follow-Up: 6 months   Any Other Special Instructions Will Be Listed Below (If Applicable).   If you need a refill on your cardiac medications before your next appointment, please call your pharmacy.  

## 2021-03-10 DIAGNOSIS — J069 Acute upper respiratory infection, unspecified: Secondary | ICD-10-CM | POA: Diagnosis not present

## 2021-03-10 DIAGNOSIS — Z2821 Immunization not carried out because of patient refusal: Secondary | ICD-10-CM | POA: Diagnosis not present

## 2021-03-10 DIAGNOSIS — I10 Essential (primary) hypertension: Secondary | ICD-10-CM | POA: Diagnosis not present

## 2021-03-10 DIAGNOSIS — E1165 Type 2 diabetes mellitus with hyperglycemia: Secondary | ICD-10-CM | POA: Diagnosis not present

## 2021-03-10 DIAGNOSIS — N184 Chronic kidney disease, stage 4 (severe): Secondary | ICD-10-CM | POA: Diagnosis not present

## 2021-03-10 DIAGNOSIS — Z6837 Body mass index (BMI) 37.0-37.9, adult: Secondary | ICD-10-CM | POA: Diagnosis not present

## 2021-03-10 DIAGNOSIS — Z299 Encounter for prophylactic measures, unspecified: Secondary | ICD-10-CM | POA: Diagnosis not present

## 2021-03-10 DIAGNOSIS — K219 Gastro-esophageal reflux disease without esophagitis: Secondary | ICD-10-CM | POA: Diagnosis not present

## 2021-03-12 DIAGNOSIS — N189 Chronic kidney disease, unspecified: Secondary | ICD-10-CM | POA: Diagnosis not present

## 2021-03-12 DIAGNOSIS — E7800 Pure hypercholesterolemia, unspecified: Secondary | ICD-10-CM | POA: Diagnosis not present

## 2021-03-12 DIAGNOSIS — E1122 Type 2 diabetes mellitus with diabetic chronic kidney disease: Secondary | ICD-10-CM | POA: Diagnosis not present

## 2021-03-12 DIAGNOSIS — R809 Proteinuria, unspecified: Secondary | ICD-10-CM | POA: Diagnosis not present

## 2021-03-12 DIAGNOSIS — I129 Hypertensive chronic kidney disease with stage 1 through stage 4 chronic kidney disease, or unspecified chronic kidney disease: Secondary | ICD-10-CM | POA: Diagnosis not present

## 2021-03-12 DIAGNOSIS — E876 Hypokalemia: Secondary | ICD-10-CM | POA: Diagnosis not present

## 2021-03-12 DIAGNOSIS — I10 Essential (primary) hypertension: Secondary | ICD-10-CM | POA: Diagnosis not present

## 2021-03-12 DIAGNOSIS — I5032 Chronic diastolic (congestive) heart failure: Secondary | ICD-10-CM | POA: Diagnosis not present

## 2021-03-12 DIAGNOSIS — E1129 Type 2 diabetes mellitus with other diabetic kidney complication: Secondary | ICD-10-CM | POA: Diagnosis not present

## 2021-03-12 DIAGNOSIS — E611 Iron deficiency: Secondary | ICD-10-CM | POA: Diagnosis not present

## 2021-03-12 DIAGNOSIS — N170 Acute kidney failure with tubular necrosis: Secondary | ICD-10-CM | POA: Diagnosis not present

## 2021-03-16 DIAGNOSIS — D485 Neoplasm of uncertain behavior of skin: Secondary | ICD-10-CM | POA: Diagnosis not present

## 2021-03-16 DIAGNOSIS — L821 Other seborrheic keratosis: Secondary | ICD-10-CM | POA: Diagnosis not present

## 2021-03-16 DIAGNOSIS — D225 Melanocytic nevi of trunk: Secondary | ICD-10-CM | POA: Diagnosis not present

## 2021-03-16 DIAGNOSIS — L570 Actinic keratosis: Secondary | ICD-10-CM | POA: Diagnosis not present

## 2021-03-17 DIAGNOSIS — N170 Acute kidney failure with tubular necrosis: Secondary | ICD-10-CM | POA: Diagnosis not present

## 2021-03-17 DIAGNOSIS — E1122 Type 2 diabetes mellitus with diabetic chronic kidney disease: Secondary | ICD-10-CM | POA: Diagnosis not present

## 2021-03-17 DIAGNOSIS — N189 Chronic kidney disease, unspecified: Secondary | ICD-10-CM | POA: Diagnosis not present

## 2021-03-17 DIAGNOSIS — I5032 Chronic diastolic (congestive) heart failure: Secondary | ICD-10-CM | POA: Diagnosis not present

## 2021-03-17 DIAGNOSIS — E211 Secondary hyperparathyroidism, not elsewhere classified: Secondary | ICD-10-CM | POA: Diagnosis not present

## 2021-03-17 DIAGNOSIS — E1129 Type 2 diabetes mellitus with other diabetic kidney complication: Secondary | ICD-10-CM | POA: Diagnosis not present

## 2021-03-17 DIAGNOSIS — I129 Hypertensive chronic kidney disease with stage 1 through stage 4 chronic kidney disease, or unspecified chronic kidney disease: Secondary | ICD-10-CM | POA: Diagnosis not present

## 2021-03-17 DIAGNOSIS — E876 Hypokalemia: Secondary | ICD-10-CM | POA: Diagnosis not present

## 2021-03-17 DIAGNOSIS — R809 Proteinuria, unspecified: Secondary | ICD-10-CM | POA: Diagnosis not present

## 2021-03-22 ENCOUNTER — Other Ambulatory Visit: Payer: Self-pay | Admitting: Internal Medicine

## 2021-03-22 DIAGNOSIS — Z139 Encounter for screening, unspecified: Secondary | ICD-10-CM

## 2021-03-23 ENCOUNTER — Inpatient Hospital Stay: Admission: RE | Admit: 2021-03-23 | Payer: PPO | Source: Ambulatory Visit

## 2021-03-23 DIAGNOSIS — Z9989 Dependence on other enabling machines and devices: Secondary | ICD-10-CM | POA: Diagnosis not present

## 2021-03-23 DIAGNOSIS — G4733 Obstructive sleep apnea (adult) (pediatric): Secondary | ICD-10-CM | POA: Diagnosis not present

## 2021-03-23 DIAGNOSIS — Z6837 Body mass index (BMI) 37.0-37.9, adult: Secondary | ICD-10-CM | POA: Diagnosis not present

## 2021-04-12 DIAGNOSIS — M7051 Other bursitis of knee, right knee: Secondary | ICD-10-CM | POA: Diagnosis not present

## 2021-04-12 DIAGNOSIS — M1712 Unilateral primary osteoarthritis, left knee: Secondary | ICD-10-CM | POA: Diagnosis not present

## 2021-04-18 ENCOUNTER — Other Ambulatory Visit: Payer: Self-pay | Admitting: Cardiology

## 2021-04-20 ENCOUNTER — Other Ambulatory Visit: Payer: Self-pay

## 2021-04-20 ENCOUNTER — Ambulatory Visit
Admission: RE | Admit: 2021-04-20 | Discharge: 2021-04-20 | Disposition: A | Discharge status: Home / Self Care | Payer: PPO | Source: Ambulatory Visit | Attending: Internal Medicine | Admitting: Internal Medicine

## 2021-04-20 DIAGNOSIS — Z1231 Encounter for screening mammogram for malignant neoplasm of breast: Secondary | ICD-10-CM | POA: Diagnosis not present

## 2021-04-20 DIAGNOSIS — Z139 Encounter for screening, unspecified: Secondary | ICD-10-CM

## 2021-04-21 DIAGNOSIS — E1122 Type 2 diabetes mellitus with diabetic chronic kidney disease: Secondary | ICD-10-CM | POA: Diagnosis not present

## 2021-04-21 DIAGNOSIS — R809 Proteinuria, unspecified: Secondary | ICD-10-CM | POA: Diagnosis not present

## 2021-04-21 DIAGNOSIS — E1129 Type 2 diabetes mellitus with other diabetic kidney complication: Secondary | ICD-10-CM | POA: Diagnosis not present

## 2021-04-21 DIAGNOSIS — I5032 Chronic diastolic (congestive) heart failure: Secondary | ICD-10-CM | POA: Diagnosis not present

## 2021-04-21 DIAGNOSIS — N170 Acute kidney failure with tubular necrosis: Secondary | ICD-10-CM | POA: Diagnosis not present

## 2021-04-21 DIAGNOSIS — N189 Chronic kidney disease, unspecified: Secondary | ICD-10-CM | POA: Diagnosis not present

## 2021-04-21 DIAGNOSIS — I129 Hypertensive chronic kidney disease with stage 1 through stage 4 chronic kidney disease, or unspecified chronic kidney disease: Secondary | ICD-10-CM | POA: Diagnosis not present

## 2021-04-21 DIAGNOSIS — E876 Hypokalemia: Secondary | ICD-10-CM | POA: Diagnosis not present

## 2021-04-28 DIAGNOSIS — I129 Hypertensive chronic kidney disease with stage 1 through stage 4 chronic kidney disease, or unspecified chronic kidney disease: Secondary | ICD-10-CM | POA: Diagnosis not present

## 2021-04-28 DIAGNOSIS — N189 Chronic kidney disease, unspecified: Secondary | ICD-10-CM | POA: Diagnosis not present

## 2021-04-28 DIAGNOSIS — E211 Secondary hyperparathyroidism, not elsewhere classified: Secondary | ICD-10-CM | POA: Diagnosis not present

## 2021-04-28 DIAGNOSIS — E1122 Type 2 diabetes mellitus with diabetic chronic kidney disease: Secondary | ICD-10-CM | POA: Diagnosis not present

## 2021-04-28 DIAGNOSIS — R809 Proteinuria, unspecified: Secondary | ICD-10-CM | POA: Diagnosis not present

## 2021-04-28 DIAGNOSIS — E876 Hypokalemia: Secondary | ICD-10-CM | POA: Diagnosis not present

## 2021-04-28 DIAGNOSIS — I5032 Chronic diastolic (congestive) heart failure: Secondary | ICD-10-CM | POA: Diagnosis not present

## 2021-04-28 DIAGNOSIS — E1129 Type 2 diabetes mellitus with other diabetic kidney complication: Secondary | ICD-10-CM | POA: Diagnosis not present

## 2021-05-12 DIAGNOSIS — Z789 Other specified health status: Secondary | ICD-10-CM | POA: Diagnosis not present

## 2021-05-12 DIAGNOSIS — J4530 Mild persistent asthma, uncomplicated: Secondary | ICD-10-CM | POA: Diagnosis not present

## 2021-05-12 DIAGNOSIS — R809 Proteinuria, unspecified: Secondary | ICD-10-CM | POA: Diagnosis not present

## 2021-05-12 DIAGNOSIS — Z299 Encounter for prophylactic measures, unspecified: Secondary | ICD-10-CM | POA: Diagnosis not present

## 2021-05-12 DIAGNOSIS — E1129 Type 2 diabetes mellitus with other diabetic kidney complication: Secondary | ICD-10-CM | POA: Diagnosis not present

## 2021-05-12 DIAGNOSIS — J329 Chronic sinusitis, unspecified: Secondary | ICD-10-CM | POA: Diagnosis not present

## 2021-05-13 DIAGNOSIS — E1129 Type 2 diabetes mellitus with other diabetic kidney complication: Secondary | ICD-10-CM | POA: Diagnosis not present

## 2021-05-13 DIAGNOSIS — I129 Hypertensive chronic kidney disease with stage 1 through stage 4 chronic kidney disease, or unspecified chronic kidney disease: Secondary | ICD-10-CM | POA: Diagnosis not present

## 2021-05-13 DIAGNOSIS — I5032 Chronic diastolic (congestive) heart failure: Secondary | ICD-10-CM | POA: Diagnosis not present

## 2021-05-13 DIAGNOSIS — E211 Secondary hyperparathyroidism, not elsewhere classified: Secondary | ICD-10-CM | POA: Diagnosis not present

## 2021-05-13 DIAGNOSIS — E1122 Type 2 diabetes mellitus with diabetic chronic kidney disease: Secondary | ICD-10-CM | POA: Diagnosis not present

## 2021-05-13 DIAGNOSIS — N189 Chronic kidney disease, unspecified: Secondary | ICD-10-CM | POA: Diagnosis not present

## 2021-05-13 DIAGNOSIS — R809 Proteinuria, unspecified: Secondary | ICD-10-CM | POA: Diagnosis not present

## 2021-05-13 DIAGNOSIS — E876 Hypokalemia: Secondary | ICD-10-CM | POA: Diagnosis not present

## 2021-05-27 DIAGNOSIS — E876 Hypokalemia: Secondary | ICD-10-CM | POA: Diagnosis not present

## 2021-05-27 DIAGNOSIS — N170 Acute kidney failure with tubular necrosis: Secondary | ICD-10-CM | POA: Diagnosis not present

## 2021-05-27 DIAGNOSIS — I129 Hypertensive chronic kidney disease with stage 1 through stage 4 chronic kidney disease, or unspecified chronic kidney disease: Secondary | ICD-10-CM | POA: Diagnosis not present

## 2021-06-11 DIAGNOSIS — N184 Chronic kidney disease, stage 4 (severe): Secondary | ICD-10-CM | POA: Diagnosis not present

## 2021-06-11 DIAGNOSIS — E1122 Type 2 diabetes mellitus with diabetic chronic kidney disease: Secondary | ICD-10-CM | POA: Diagnosis not present

## 2021-06-11 DIAGNOSIS — E1165 Type 2 diabetes mellitus with hyperglycemia: Secondary | ICD-10-CM | POA: Diagnosis not present

## 2021-06-11 DIAGNOSIS — J329 Chronic sinusitis, unspecified: Secondary | ICD-10-CM | POA: Diagnosis not present

## 2021-06-11 DIAGNOSIS — Z299 Encounter for prophylactic measures, unspecified: Secondary | ICD-10-CM | POA: Diagnosis not present

## 2021-06-11 DIAGNOSIS — I10 Essential (primary) hypertension: Secondary | ICD-10-CM | POA: Diagnosis not present

## 2021-06-11 DIAGNOSIS — Z6837 Body mass index (BMI) 37.0-37.9, adult: Secondary | ICD-10-CM | POA: Diagnosis not present

## 2021-06-15 ENCOUNTER — Other Ambulatory Visit: Payer: Self-pay

## 2021-06-15 ENCOUNTER — Encounter (HOSPITAL_COMMUNITY): Payer: Self-pay

## 2021-06-15 ENCOUNTER — Other Ambulatory Visit: Payer: Self-pay | Admitting: Family Medicine

## 2021-06-15 ENCOUNTER — Other Ambulatory Visit (HOSPITAL_COMMUNITY)
Admission: RE | Admit: 2021-06-15 | Discharge: 2021-06-15 | Disposition: A | Discharge status: Home / Self Care | Payer: PPO | Source: Ambulatory Visit | Attending: Family Medicine | Admitting: Family Medicine

## 2021-06-15 DIAGNOSIS — N179 Acute kidney failure, unspecified: Secondary | ICD-10-CM | POA: Diagnosis present

## 2021-06-15 DIAGNOSIS — R600 Localized edema: Secondary | ICD-10-CM | POA: Diagnosis present

## 2021-06-15 DIAGNOSIS — Z885 Allergy status to narcotic agent status: Secondary | ICD-10-CM

## 2021-06-15 DIAGNOSIS — N390 Urinary tract infection, site not specified: Secondary | ICD-10-CM | POA: Diagnosis present

## 2021-06-15 DIAGNOSIS — Z85828 Personal history of other malignant neoplasm of skin: Secondary | ICD-10-CM

## 2021-06-15 DIAGNOSIS — R0602 Shortness of breath: Secondary | ICD-10-CM | POA: Insufficient documentation

## 2021-06-15 DIAGNOSIS — Z20822 Contact with and (suspected) exposure to covid-19: Secondary | ICD-10-CM | POA: Diagnosis present

## 2021-06-15 DIAGNOSIS — R195 Other fecal abnormalities: Secondary | ICD-10-CM | POA: Diagnosis present

## 2021-06-15 DIAGNOSIS — J310 Chronic rhinitis: Secondary | ICD-10-CM | POA: Diagnosis present

## 2021-06-15 DIAGNOSIS — E1122 Type 2 diabetes mellitus with diabetic chronic kidney disease: Secondary | ICD-10-CM | POA: Diagnosis present

## 2021-06-15 DIAGNOSIS — K7689 Other specified diseases of liver: Secondary | ICD-10-CM | POA: Diagnosis not present

## 2021-06-15 DIAGNOSIS — N1832 Chronic kidney disease, stage 3b: Secondary | ICD-10-CM | POA: Diagnosis present

## 2021-06-15 DIAGNOSIS — M109 Gout, unspecified: Secondary | ICD-10-CM | POA: Diagnosis present

## 2021-06-15 DIAGNOSIS — E860 Dehydration: Secondary | ICD-10-CM | POA: Diagnosis present

## 2021-06-15 DIAGNOSIS — E876 Hypokalemia: Secondary | ICD-10-CM | POA: Diagnosis present

## 2021-06-15 DIAGNOSIS — E872 Acidosis, unspecified: Secondary | ICD-10-CM | POA: Diagnosis present

## 2021-06-15 DIAGNOSIS — I2782 Chronic pulmonary embolism: Secondary | ICD-10-CM | POA: Diagnosis not present

## 2021-06-15 DIAGNOSIS — E1165 Type 2 diabetes mellitus with hyperglycemia: Secondary | ICD-10-CM | POA: Diagnosis not present

## 2021-06-15 DIAGNOSIS — K5900 Constipation, unspecified: Secondary | ICD-10-CM | POA: Diagnosis present

## 2021-06-15 DIAGNOSIS — E790 Hyperuricemia without signs of inflammatory arthritis and tophaceous disease: Secondary | ICD-10-CM | POA: Diagnosis not present

## 2021-06-15 DIAGNOSIS — D649 Anemia, unspecified: Secondary | ICD-10-CM | POA: Diagnosis not present

## 2021-06-15 DIAGNOSIS — R100 Acute abdomen: Secondary | ICD-10-CM | POA: Diagnosis not present

## 2021-06-15 DIAGNOSIS — D509 Iron deficiency anemia, unspecified: Secondary | ICD-10-CM | POA: Diagnosis present

## 2021-06-15 DIAGNOSIS — E669 Obesity, unspecified: Secondary | ICD-10-CM | POA: Diagnosis present

## 2021-06-15 DIAGNOSIS — I129 Hypertensive chronic kidney disease with stage 1 through stage 4 chronic kidney disease, or unspecified chronic kidney disease: Secondary | ICD-10-CM | POA: Diagnosis present

## 2021-06-15 DIAGNOSIS — Z7901 Long term (current) use of anticoagulants: Secondary | ICD-10-CM

## 2021-06-15 DIAGNOSIS — E890 Postprocedural hypothyroidism: Secondary | ICD-10-CM | POA: Diagnosis present

## 2021-06-15 DIAGNOSIS — Z86010 Personal history of colonic polyps: Secondary | ICD-10-CM | POA: Diagnosis not present

## 2021-06-15 DIAGNOSIS — I2602 Saddle embolus of pulmonary artery with acute cor pulmonale: Secondary | ICD-10-CM | POA: Diagnosis not present

## 2021-06-15 DIAGNOSIS — E871 Hypo-osmolality and hyponatremia: Secondary | ICD-10-CM | POA: Diagnosis present

## 2021-06-15 DIAGNOSIS — D631 Anemia in chronic kidney disease: Secondary | ICD-10-CM | POA: Diagnosis present

## 2021-06-15 DIAGNOSIS — Z6837 Body mass index (BMI) 37.0-37.9, adult: Secondary | ICD-10-CM

## 2021-06-15 DIAGNOSIS — K449 Diaphragmatic hernia without obstruction or gangrene: Secondary | ICD-10-CM | POA: Diagnosis present

## 2021-06-15 DIAGNOSIS — Z9049 Acquired absence of other specified parts of digestive tract: Secondary | ICD-10-CM

## 2021-06-15 DIAGNOSIS — M1990 Unspecified osteoarthritis, unspecified site: Secondary | ICD-10-CM | POA: Diagnosis present

## 2021-06-15 DIAGNOSIS — Z888 Allergy status to other drugs, medicaments and biological substances status: Secondary | ICD-10-CM

## 2021-06-15 DIAGNOSIS — R6881 Early satiety: Secondary | ICD-10-CM | POA: Diagnosis present

## 2021-06-15 DIAGNOSIS — R1013 Epigastric pain: Secondary | ICD-10-CM | POA: Diagnosis not present

## 2021-06-15 DIAGNOSIS — E878 Other disorders of electrolyte and fluid balance, not elsewhere classified: Secondary | ICD-10-CM | POA: Diagnosis present

## 2021-06-15 DIAGNOSIS — R7401 Elevation of levels of liver transaminase levels: Secondary | ICD-10-CM | POA: Diagnosis not present

## 2021-06-15 DIAGNOSIS — Z79899 Other long term (current) drug therapy: Secondary | ICD-10-CM

## 2021-06-15 DIAGNOSIS — Z90711 Acquired absence of uterus with remaining cervical stump: Secondary | ICD-10-CM

## 2021-06-15 DIAGNOSIS — R0600 Dyspnea, unspecified: Secondary | ICD-10-CM | POA: Diagnosis not present

## 2021-06-15 DIAGNOSIS — Z86711 Personal history of pulmonary embolism: Secondary | ICD-10-CM

## 2021-06-15 DIAGNOSIS — Z8249 Family history of ischemic heart disease and other diseases of the circulatory system: Secondary | ICD-10-CM

## 2021-06-15 DIAGNOSIS — R7989 Other specified abnormal findings of blood chemistry: Secondary | ICD-10-CM | POA: Diagnosis not present

## 2021-06-15 DIAGNOSIS — N2889 Other specified disorders of kidney and ureter: Secondary | ICD-10-CM | POA: Diagnosis not present

## 2021-06-15 DIAGNOSIS — E1129 Type 2 diabetes mellitus with other diabetic kidney complication: Secondary | ICD-10-CM | POA: Diagnosis not present

## 2021-06-15 DIAGNOSIS — K922 Gastrointestinal hemorrhage, unspecified: Secondary | ICD-10-CM | POA: Diagnosis present

## 2021-06-15 DIAGNOSIS — E7800 Pure hypercholesterolemia, unspecified: Secondary | ICD-10-CM | POA: Diagnosis present

## 2021-06-15 DIAGNOSIS — I10 Essential (primary) hypertension: Secondary | ICD-10-CM | POA: Diagnosis not present

## 2021-06-15 DIAGNOSIS — K317 Polyp of stomach and duodenum: Secondary | ICD-10-CM | POA: Diagnosis present

## 2021-06-15 DIAGNOSIS — N184 Chronic kidney disease, stage 4 (severe): Secondary | ICD-10-CM | POA: Diagnosis not present

## 2021-06-15 DIAGNOSIS — I2699 Other pulmonary embolism without acute cor pulmonale: Secondary | ICD-10-CM | POA: Diagnosis not present

## 2021-06-15 DIAGNOSIS — Z299 Encounter for prophylactic measures, unspecified: Secondary | ICD-10-CM | POA: Diagnosis not present

## 2021-06-15 DIAGNOSIS — Z91040 Latex allergy status: Secondary | ICD-10-CM

## 2021-06-15 DIAGNOSIS — E119 Type 2 diabetes mellitus without complications: Secondary | ICD-10-CM | POA: Diagnosis not present

## 2021-06-15 LAB — COMPREHENSIVE METABOLIC PANEL
ALT: 119 U/L — ABNORMAL HIGH (ref 0–44)
ALT: 117 U/L — ABNORMAL HIGH (ref 0–44)
AST: 178 U/L — ABNORMAL HIGH (ref 15–41)
AST: 177 U/L — ABNORMAL HIGH (ref 15–41)
Albumin: 4 g/dL (ref 3.5–5.0)
Albumin: 3.9 g/dL (ref 3.5–5.0)
Alkaline Phosphatase: 90 U/L (ref 38–126)
Alkaline Phosphatase: 92 U/L (ref 38–126)
Anion gap: 18 — ABNORMAL HIGH (ref 5–15)
Anion gap: 20 — ABNORMAL HIGH (ref 5–15)
BUN: 121 mg/dL — ABNORMAL HIGH (ref 8–23)
BUN: 129 mg/dL — ABNORMAL HIGH (ref 8–23)
CO2: 22 mmol/L (ref 22–32)
CO2: 20 mmol/L — ABNORMAL LOW (ref 22–32)
Calcium: 9 mg/dL (ref 8.9–10.3)
Calcium: 8.8 mg/dL — ABNORMAL LOW (ref 8.9–10.3)
Chloride: 95 mmol/L — ABNORMAL LOW (ref 98–111)
Chloride: 93 mmol/L — ABNORMAL LOW (ref 98–111)
Creatinine, Ser: 5.87 mg/dL — ABNORMAL HIGH (ref 0.44–1.00)
Creatinine, Ser: 5.83 mg/dL — ABNORMAL HIGH (ref 0.44–1.00)
GFR, Estimated: 7 mL/min — ABNORMAL LOW (ref >60)
GFR, Estimated: 7 mL/min — ABNORMAL LOW (ref >60)
Glucose, Bld: 128 mg/dL — ABNORMAL HIGH (ref 70–99)
Glucose, Bld: 166 mg/dL — ABNORMAL HIGH (ref 70–99)
Potassium: 3.4 mmol/L — ABNORMAL LOW (ref 3.5–5.1)
Potassium: 2.9 mmol/L — ABNORMAL LOW (ref 3.5–5.1)
Sodium: 135 mmol/L (ref 135–145)
Sodium: 133 mmol/L — ABNORMAL LOW (ref 135–145)
Total Bilirubin: 0.7 mg/dL (ref 0.3–1.2)
Total Bilirubin: 0.9 mg/dL (ref 0.3–1.2)
Total Protein: 7.1 g/dL (ref 6.5–8.1)
Total Protein: 7 g/dL (ref 6.5–8.1)

## 2021-06-15 LAB — CBC
HCT: 31.2 % — ABNORMAL LOW (ref 36.0–46.0)
Hemoglobin: 10.6 g/dL — ABNORMAL LOW (ref 12.0–15.0)
MCH: 32.7 pg (ref 26.0–34.0)
MCHC: 34 g/dL (ref 30.0–36.0)
MCV: 96.3 fL (ref 80.0–100.0)
Platelets: 198 10*3/uL (ref 150–400)
RBC: 3.24 MIL/uL — ABNORMAL LOW (ref 3.87–5.11)
RDW: 16.8 % — ABNORMAL HIGH (ref 11.5–15.5)
WBC: 9.3 10*3/uL (ref 4.0–10.5)
nRBC: 0 % (ref 0.0–0.2)

## 2021-06-15 NOTE — ED Triage Notes (Signed)
Was told by dr Manuella Ghazi to come to the ed. Went to [facility] for blood work and a ct scan however her kidney function was too bad for a ct scan. They were concerned for a potential blood clot.  Has a history of PE. They said she was dehydrated and needed to be seen.

## 2021-06-16 ENCOUNTER — Inpatient Hospital Stay (HOSPITAL_COMMUNITY): Payer: PPO

## 2021-06-16 ENCOUNTER — Emergency Department (HOSPITAL_COMMUNITY): Payer: PPO

## 2021-06-16 ENCOUNTER — Inpatient Hospital Stay (HOSPITAL_COMMUNITY)
Admission: EM | Admit: 2021-06-16 | Discharge: 2021-06-21 | DRG: 683 | Disposition: A | Discharge status: Home Health | Payer: PPO | Attending: Family Medicine | Admitting: Family Medicine

## 2021-06-16 ENCOUNTER — Encounter (HOSPITAL_COMMUNITY): Payer: Self-pay | Admitting: Internal Medicine

## 2021-06-16 DIAGNOSIS — E119 Type 2 diabetes mellitus without complications: Secondary | ICD-10-CM | POA: Diagnosis not present

## 2021-06-16 DIAGNOSIS — E890 Postprocedural hypothyroidism: Secondary | ICD-10-CM | POA: Diagnosis present

## 2021-06-16 DIAGNOSIS — E669 Obesity, unspecified: Secondary | ICD-10-CM | POA: Diagnosis present

## 2021-06-16 DIAGNOSIS — Z7901 Long term (current) use of anticoagulants: Secondary | ICD-10-CM | POA: Diagnosis not present

## 2021-06-16 DIAGNOSIS — Z860100 Personal history of colon polyps, unspecified: Secondary | ICD-10-CM

## 2021-06-16 DIAGNOSIS — E7800 Pure hypercholesterolemia, unspecified: Secondary | ICD-10-CM | POA: Diagnosis present

## 2021-06-16 DIAGNOSIS — N1832 Chronic kidney disease, stage 3b: Secondary | ICD-10-CM | POA: Diagnosis present

## 2021-06-16 DIAGNOSIS — I129 Hypertensive chronic kidney disease with stage 1 through stage 4 chronic kidney disease, or unspecified chronic kidney disease: Secondary | ICD-10-CM | POA: Diagnosis present

## 2021-06-16 DIAGNOSIS — Z20822 Contact with and (suspected) exposure to covid-19: Secondary | ICD-10-CM | POA: Diagnosis present

## 2021-06-16 DIAGNOSIS — E871 Hypo-osmolality and hyponatremia: Secondary | ICD-10-CM

## 2021-06-16 DIAGNOSIS — E876 Hypokalemia: Secondary | ICD-10-CM | POA: Diagnosis not present

## 2021-06-16 DIAGNOSIS — K449 Diaphragmatic hernia without obstruction or gangrene: Secondary | ICD-10-CM | POA: Diagnosis not present

## 2021-06-16 DIAGNOSIS — R195 Other fecal abnormalities: Secondary | ICD-10-CM | POA: Diagnosis present

## 2021-06-16 DIAGNOSIS — E790 Hyperuricemia without signs of inflammatory arthritis and tophaceous disease: Secondary | ICD-10-CM | POA: Diagnosis not present

## 2021-06-16 DIAGNOSIS — E860 Dehydration: Secondary | ICD-10-CM | POA: Diagnosis present

## 2021-06-16 DIAGNOSIS — R0600 Dyspnea, unspecified: Secondary | ICD-10-CM | POA: Diagnosis not present

## 2021-06-16 DIAGNOSIS — R7401 Elevation of levels of liver transaminase levels: Secondary | ICD-10-CM | POA: Diagnosis not present

## 2021-06-16 DIAGNOSIS — E1122 Type 2 diabetes mellitus with diabetic chronic kidney disease: Secondary | ICD-10-CM | POA: Diagnosis present

## 2021-06-16 DIAGNOSIS — R0602 Shortness of breath: Secondary | ICD-10-CM | POA: Diagnosis present

## 2021-06-16 DIAGNOSIS — D631 Anemia in chronic kidney disease: Secondary | ICD-10-CM | POA: Diagnosis present

## 2021-06-16 DIAGNOSIS — K317 Polyp of stomach and duodenum: Secondary | ICD-10-CM | POA: Diagnosis not present

## 2021-06-16 DIAGNOSIS — N189 Chronic kidney disease, unspecified: Secondary | ICD-10-CM | POA: Diagnosis present

## 2021-06-16 DIAGNOSIS — M1990 Unspecified osteoarthritis, unspecified site: Secondary | ICD-10-CM | POA: Diagnosis present

## 2021-06-16 DIAGNOSIS — N179 Acute kidney failure, unspecified: Principal | ICD-10-CM | POA: Diagnosis present

## 2021-06-16 DIAGNOSIS — E878 Other disorders of electrolyte and fluid balance, not elsewhere classified: Secondary | ICD-10-CM | POA: Diagnosis present

## 2021-06-16 DIAGNOSIS — I2782 Chronic pulmonary embolism: Secondary | ICD-10-CM | POA: Diagnosis not present

## 2021-06-16 DIAGNOSIS — R600 Localized edema: Secondary | ICD-10-CM | POA: Diagnosis present

## 2021-06-16 DIAGNOSIS — E872 Acidosis, unspecified: Secondary | ICD-10-CM | POA: Diagnosis present

## 2021-06-16 DIAGNOSIS — I10 Essential (primary) hypertension: Secondary | ICD-10-CM | POA: Diagnosis present

## 2021-06-16 DIAGNOSIS — I2699 Other pulmonary embolism without acute cor pulmonale: Secondary | ICD-10-CM | POA: Diagnosis present

## 2021-06-16 DIAGNOSIS — J310 Chronic rhinitis: Secondary | ICD-10-CM | POA: Diagnosis present

## 2021-06-16 DIAGNOSIS — D649 Anemia, unspecified: Secondary | ICD-10-CM | POA: Diagnosis not present

## 2021-06-16 DIAGNOSIS — K5900 Constipation, unspecified: Secondary | ICD-10-CM | POA: Diagnosis present

## 2021-06-16 DIAGNOSIS — M109 Gout, unspecified: Secondary | ICD-10-CM | POA: Diagnosis present

## 2021-06-16 DIAGNOSIS — D509 Iron deficiency anemia, unspecified: Secondary | ICD-10-CM | POA: Diagnosis not present

## 2021-06-16 DIAGNOSIS — I2602 Saddle embolus of pulmonary artery with acute cor pulmonale: Secondary | ICD-10-CM | POA: Diagnosis not present

## 2021-06-16 DIAGNOSIS — R1013 Epigastric pain: Secondary | ICD-10-CM | POA: Diagnosis not present

## 2021-06-16 DIAGNOSIS — N390 Urinary tract infection, site not specified: Secondary | ICD-10-CM | POA: Diagnosis present

## 2021-06-16 DIAGNOSIS — R0609 Other forms of dyspnea: Secondary | ICD-10-CM | POA: Diagnosis present

## 2021-06-16 DIAGNOSIS — K922 Gastrointestinal hemorrhage, unspecified: Secondary | ICD-10-CM | POA: Diagnosis present

## 2021-06-16 DIAGNOSIS — R7989 Other specified abnormal findings of blood chemistry: Secondary | ICD-10-CM

## 2021-06-16 LAB — IRON AND TIBC
Iron: 58 ug/dL (ref 28–170)
Saturation Ratios: 13 % (ref 10.4–31.8)
TIBC: 445 ug/dL (ref 250–450)
UIBC: 387 ug/dL

## 2021-06-16 LAB — URINALYSIS, ROUTINE W REFLEX MICROSCOPIC
Bilirubin Urine: NEGATIVE
Bilirubin Urine: NEGATIVE
Glucose, UA: NEGATIVE mg/dL
Glucose, UA: NEGATIVE mg/dL
Ketones, ur: NEGATIVE mg/dL
Ketones, ur: NEGATIVE mg/dL
Leukocytes,Ua: NEGATIVE
Nitrite: NEGATIVE
Nitrite: NEGATIVE
Protein, ur: 30 mg/dL — AB
Protein, ur: 30 mg/dL — AB
Specific Gravity, Urine: 1.01 (ref 1.005–1.030)
Specific Gravity, Urine: 1.009 (ref 1.005–1.030)
pH: 5 (ref 5.0–8.0)
pH: 5 (ref 5.0–8.0)

## 2021-06-16 LAB — RETICULOCYTES
Immature Retic Fract: 16.1 % — ABNORMAL HIGH (ref 2.3–15.9)
RBC.: 3.24 MIL/uL — ABNORMAL LOW (ref 3.87–5.11)
Retic Count, Absolute: 69.3 10*3/uL (ref 19.0–186.0)
Retic Ct Pct: 2.1 % (ref 0.4–3.1)

## 2021-06-16 LAB — CBG MONITORING, ED
Glucose-Capillary: 114 mg/dL — ABNORMAL HIGH (ref 70–99)
Glucose-Capillary: 190 mg/dL — ABNORMAL HIGH (ref 70–99)

## 2021-06-16 LAB — SODIUM, URINE, RANDOM: Sodium, Ur: 37 mmol/L

## 2021-06-16 LAB — HEPATITIS PANEL, ACUTE
HCV Ab: NONREACTIVE
Hep A IgM: NONREACTIVE
Hep B C IgM: NONREACTIVE
Hepatitis B Surface Ag: NONREACTIVE

## 2021-06-16 LAB — MAGNESIUM: Magnesium: 2.1 mg/dL (ref 1.7–2.4)

## 2021-06-16 LAB — CHLORIDE, URINE, RANDOM: Chloride Urine: 47 mmol/L

## 2021-06-16 LAB — RESP PANEL BY RT-PCR (FLU A&B, COVID) ARPGX2
Influenza A by PCR: NEGATIVE
Influenza B by PCR: NEGATIVE
SARS Coronavirus 2 by RT PCR: NEGATIVE

## 2021-06-16 LAB — BRAIN NATRIURETIC PEPTIDE: B Natriuretic Peptide: 147 pg/mL — ABNORMAL HIGH (ref 0.0–100.0)

## 2021-06-16 LAB — TYPE AND SCREEN
ABO/RH(D): A NEG
Antibody Screen: NEGATIVE

## 2021-06-16 LAB — HEMOGLOBIN A1C
Hgb A1c MFr Bld: 8.1 % — ABNORMAL HIGH (ref 4.8–5.6)
Mean Plasma Glucose: 185.77 mg/dL

## 2021-06-16 LAB — GLUCOSE, CAPILLARY
Glucose-Capillary: 119 mg/dL — ABNORMAL HIGH (ref 70–99)
Glucose-Capillary: 125 mg/dL — ABNORMAL HIGH (ref 70–99)

## 2021-06-16 LAB — URIC ACID: Uric Acid, Serum: 4 mg/dL (ref 2.5–7.1)

## 2021-06-16 LAB — VITAMIN B12: Vitamin B-12: 440 pg/mL (ref 180–914)

## 2021-06-16 LAB — CREATININE, URINE, RANDOM: Creatinine, Urine: 45.75 mg/dL

## 2021-06-16 LAB — FERRITIN: Ferritin: 177 ng/mL (ref 11–307)

## 2021-06-16 LAB — FOLATE: Folate: 22 ng/mL (ref >5.9)

## 2021-06-16 MED ORDER — FLUCONAZOLE 150 MG PO TABS
75.0000 mg | ORAL_TABLET | Freq: Every day | ORAL | Status: AC
  Administered 2021-06-16 – 2021-06-18 (×3): 75 mg via ORAL
  Filled 2021-06-16: qty 1
  Filled 2021-06-16 (×2): qty 2
  Filled 2021-06-16: qty 1
  Filled 2021-06-16 (×2): qty 2

## 2021-06-16 MED ORDER — CARVEDILOL 3.125 MG PO TABS
6.2500 mg | ORAL_TABLET | Freq: Two times a day (BID) | ORAL | Status: DC
  Administered 2021-06-16 – 2021-06-21 (×9): 6.25 mg via ORAL
  Filled 2021-06-16 (×10): qty 2

## 2021-06-16 MED ORDER — ACETAMINOPHEN 650 MG RE SUPP: 650.0000 mg | Freq: Four times a day (QID) | RECTAL | Status: DC | PRN

## 2021-06-16 MED ORDER — ALLOPURINOL 100 MG PO TABS: 300.0000 mg | ORAL_TABLET | Freq: Every day | ORAL | Status: DC

## 2021-06-16 MED ORDER — PANTOPRAZOLE SODIUM 40 MG PO TBEC
DELAYED_RELEASE_TABLET | ORAL | Status: AC
  Filled 2021-06-16: qty 1

## 2021-06-16 MED ORDER — FLUCONAZOLE 150 MG PO TABS: 150.0000 mg | ORAL_TABLET | Freq: Every day | ORAL | Status: DC

## 2021-06-16 MED ORDER — ACETAMINOPHEN 325 MG PO TABS
650.0000 mg | ORAL_TABLET | Freq: Four times a day (QID) | ORAL | Status: DC | PRN
  Administered 2021-06-16 – 2021-06-18 (×5): 650 mg via ORAL
  Filled 2021-06-16 (×5): qty 2

## 2021-06-16 MED ORDER — POTASSIUM CHLORIDE CRYS ER 20 MEQ PO TBCR
EXTENDED_RELEASE_TABLET | ORAL | Status: AC
  Filled 2021-06-16: qty 2

## 2021-06-16 MED ORDER — SALINE SPRAY 0.65 % NA SOLN: 1.0000 | NASAL | Status: DC | PRN

## 2021-06-16 MED ORDER — AZELASTINE HCL 0.1 % NA SOLN: 2.0000 | Freq: Two times a day (BID) | NASAL | Status: DC | PRN

## 2021-06-16 MED ORDER — LORAZEPAM 2 MG/ML IJ SOLN
1.0000 mg | Freq: Once | INTRAMUSCULAR | Status: AC
  Administered 2021-06-16: 1 mg via INTRAVENOUS
  Filled 2021-06-16: qty 1

## 2021-06-16 MED ORDER — METOPROLOL TARTRATE 5 MG/5ML IV SOLN: 5.0000 mg | Freq: Four times a day (QID) | INTRAVENOUS | Status: DC | PRN

## 2021-06-16 MED ORDER — INSULIN ASPART 100 UNIT/ML IJ SOLN
0.0000 [IU] | Freq: Three times a day (TID) | INTRAMUSCULAR | Status: DC
  Administered 2021-06-16: 2 [IU] via SUBCUTANEOUS
  Administered 2021-06-17 – 2021-06-21 (×6): 1 [IU] via SUBCUTANEOUS
  Filled 2021-06-16: qty 1

## 2021-06-16 MED ORDER — POTASSIUM CHLORIDE CRYS ER 20 MEQ PO TBCR
40.0000 meq | EXTENDED_RELEASE_TABLET | Freq: Once | ORAL | Status: AC
  Administered 2021-06-16: 40 meq via ORAL

## 2021-06-16 MED ORDER — PANTOPRAZOLE SODIUM 40 MG PO TBEC
40.0000 mg | DELAYED_RELEASE_TABLET | Freq: Every day | ORAL | Status: DC
  Administered 2021-06-17 – 2021-06-21 (×5): 40 mg via ORAL
  Filled 2021-06-16 (×5): qty 1

## 2021-06-16 MED ORDER — LORATADINE 10 MG PO TABS
10.0000 mg | ORAL_TABLET | Freq: Every evening | ORAL | Status: DC
  Administered 2021-06-16 – 2021-06-20 (×5): 10 mg via ORAL
  Filled 2021-06-16 (×5): qty 1

## 2021-06-16 MED ORDER — POLYVINYL ALCOHOL 1.4 % OP SOLN: 1.0000 [drp] | OPHTHALMIC | Status: DC | PRN

## 2021-06-16 MED ORDER — ALLOPURINOL 100 MG PO TABS
100.0000 mg | ORAL_TABLET | Freq: Every day | ORAL | Status: DC
  Administered 2021-06-16 – 2021-06-20 (×5): 100 mg via ORAL
  Filled 2021-06-16 (×5): qty 1

## 2021-06-16 MED ORDER — LACTATED RINGERS IV SOLN: INTRAVENOUS | Status: DC

## 2021-06-16 MED ORDER — PANTOPRAZOLE SODIUM 40 MG PO TBEC
40.0000 mg | DELAYED_RELEASE_TABLET | Freq: Once | ORAL | Status: AC
  Administered 2021-06-16: 40 mg via ORAL

## 2021-06-16 MED ORDER — ONDANSETRON HCL 4 MG PO TABS
4.0000 mg | ORAL_TABLET | Freq: Four times a day (QID) | ORAL | Status: DC | PRN
  Administered 2021-06-16 – 2021-06-17 (×3): 4 mg via ORAL
  Filled 2021-06-16 (×3): qty 1

## 2021-06-16 MED ORDER — PANTOPRAZOLE SODIUM 40 MG PO TBEC
40.0000 mg | DELAYED_RELEASE_TABLET | Freq: Two times a day (BID) | ORAL | Status: DC
  Administered 2021-06-16: 40 mg via ORAL
  Filled 2021-06-16: qty 1

## 2021-06-16 MED ORDER — TECHNETIUM TO 99M ALBUMIN AGGREGATED
4.0000 | Freq: Once | INTRAVENOUS | Status: AC | PRN
  Administered 2021-06-16: 4.4 via INTRAVENOUS

## 2021-06-16 MED ORDER — DULOXETINE HCL 30 MG PO CPEP
30.0000 mg | ORAL_CAPSULE | Freq: Every day | ORAL | Status: DC
  Administered 2021-06-16 – 2021-06-21 (×6): 30 mg via ORAL
  Filled 2021-06-16 (×6): qty 1

## 2021-06-16 MED ORDER — ONDANSETRON HCL 4 MG/2ML IJ SOLN: 4.0000 mg | Freq: Four times a day (QID) | INTRAMUSCULAR | Status: DC | PRN

## 2021-06-16 NOTE — ED Notes (Signed)
POC Occult Stool Test: Positive

## 2021-06-16 NOTE — Progress Notes (Signed)
°  Transition of Care (TOC) Screening Note   Patient Details  Name: [name] Date of Birth: 1945-10-17   Transition of Care [facility]) CM/SW Contact:    Boneta Lucks, RN Phone Number: 06/16/2021, 3:34 PM    Transition of Care Department [facility]) has reviewed patient and no TOC needs have been identified at this time. We will continue to monitor patient advancement through interdisciplinary progression rounds. If new patient transition needs arise, please place a TOC consult.

## 2021-06-16 NOTE — Consult Note (Signed)
Reason for Consult:AKI/CKD stage IIIb Referring Physician: Wynetta Emery, MD  Lauren Hamilton is an 76 y.o. 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  She has been having worsening SOB and DOE for the past month and was ordered to have a CT angiogram at [facility], however pre-procedure labs were notable for AKI/CKD stage IIIb and was sent here for further workup.  The trend in Scr is seen below.  She follows Dr. Theador Hawthorne for her CKD and was started on valsartan 40 mg on 04/28/21 but was stopped presumably due to AKI/CKD with Scr rising from 1.81 to 3.12.  She also reports a cough with ACE-inhibitors as well as ARB's.  She has also been c/o worsening lower extremity edema and her lasix dose was increased to 20 mg bid prior to admission.  She denies any N/V/D, hematochezia, melena, BRBPR, dysuria, pyuria, hematuria, urgency, frequency, or retention.  She has noted decreased UOP and darker colored urine.  She denies any NSAIDs or COX-II I's (but had taken them regularly for 20 years before seeing Dr. Theador Hawthorne in 2020).  Trend in Creatinine: Creatinine, Ser  Date/Time Value Ref Range Status  06/15/2021 08:56 PM 5.83 (H) 0.44 - 1.00 mg/dL Final  06/15/2021 01:06 PM 5.87 (H) 0.44 - 1.00 mg/dL Final  05/27/2021 3.11 (H)    05/13/2021 3.12 (H)    04/21/2021 1.81 (H)    03/12/2021 1.73 (H)    03/11/2020 09:30 AM 1.27 (H) 0.60 - 0.93 mg/dL Final  10/31/2019 12:50 PM 1.30 (H) 0.44 - 1.00 mg/dL Final  10/14/2017 04:16 AM 1.34 (H) 0.44 - 1.00 mg/dL Final  10/13/2017 04:35 PM 1.58 (H) 0.44 - 1.00 mg/dL Final    PMH:   Past Medical History:  Diagnosis Date   Arthritis    Bilateral pulmonary embolism ([place]) 10/2017   H/O unprovoked PE in 2017/2018 treated with anticoagulation for one year and 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 off  anticoagulation   Bronchitis    Cancer ([place])    Skin   Diabetes mellitus without complication ([place])    Gout    H/O colonoscopy with polypectomy    Hypercholesteremia    Hypertension    Renal disorder    Renal insufficiency     PSH:   Past Surgical History:  Procedure Laterality Date   bilateral heel spur removed     carpel tunnel Bilateral    CHOLECYSTECTOMY     COLONOSCOPY  2013   [facility]: hyperplastic polyps. advised 5 year follow up colonoscopy.    COLONOSCOPY N/A 06/12/2018   Procedure: COLONOSCOPY;  Surgeon: [name], MD;  Location: [facility] ENDO SUITE;  Service: Endoscopy;  Laterality: N/A;  8:30am   KNEE ARTHROSCOPY Right    PARTIAL HYSTERECTOMY     POLYPECTOMY  06/12/2018   Procedure: POLYPECTOMY;  Surgeon: [name], MD;  Location: [facility] ENDO SUITE;  Service: Endoscopy;;  hep.flex   THYROIDECTOMY, PARTIAL     TONSILLECTOMY      Allergies:  Allergies  Allergen Reactions   Other Itching    Unknown reaction   Latex Rash    Reaction is only with internal contact Reaction is only with internal contact   Clonidine Derivatives     Dizzy, dry mouth   Etodolac     Unknown reaction   Hydralazine  Hydrocodone Other (See Comments)    Altered mental status-Hallucination, tolerated in low doses    Naproxen Other (See Comments)    Damage to kidney    Statins Other (See Comments)    Unknown reaction, tolerates lovastatin    Tramadol     Patient, "Felt funny, didn't feel right, and felt weird."   Amlodipine Cough   Lisinopril Cough   Losartan Cough    Medications:   Prior to Admission medications   Medication Sig Start Date End Date Taking? Authorizing Provider  allopurinol (ZYLOPRIM) 100 MG tablet Take 300 mg by mouth at bedtime.    Yes [provider]  Artificial Tear Solution (SOOTHE XP) SOLN Apply 1-2 drops to eye as needed (dry eyes).    Yes [provider]  Ascorbic Acid (VITAMIN C PO) Take 1 tablet by  mouth daily.   Yes [provider]  carvedilol (COREG) 12.5 MG tablet Take 12.5 mg by mouth 2 (two) times daily with a meal.   Yes [provider]  chlorthalidone (HYGROTON) 25 MG tablet TAKE 1 TABLET BY MOUTH DAILY 04/19/21  Yes Branch, Alphonse Guild, MD  ferrous sulfate 325 (65 FE) MG tablet Take 325 mg by mouth daily.   Yes [provider]  furosemide (LASIX) 20 MG tablet Take 20 mg by mouth 2 (two) times daily.   Yes [provider]  levocetirizine (XYZAL) 5 MG tablet Take 5 mg by mouth every evening.   Yes [provider]  MAGNESIUM PO Take 1 tablet by mouth daily.   Yes [provider]  omeprazole (PRILOSEC) 40 MG capsule Take 40 mg by mouth daily.   Yes [provider]  potassium chloride 20 MEQ/15ML (10%) SOLN Take 20 mEq by mouth 2 (two) times daily.   Yes [provider]  rivaroxaban (XARELTO) 20 MG TABS tablet Take 1 tablet (20 mg total) by mouth daily with supper. Start using after you complete the starter pack Patient taking differently: Take 20 mg by mouth daily with supper. 10/14/17  Yes Isaac Bliss, Rayford Halsted, MD  rosuvastatin (CRESTOR) 40 MG tablet Take 40 mg by mouth daily. 06/01/21  Yes [provider]  spironolactone (ALDACTONE) 25 MG tablet Take 12.5 mg by mouth every evening.   Yes [provider]  SUMAtriptan (IMITREX) 100 MG tablet Take 1 tablet by mouth every 2 (two) hours as needed for migraine. 07/07/19  Yes [provider]  vitamin E 400 UNIT capsule Take 400 Units by mouth daily.   Yes [provider]  colchicine-probenecid 0.5-500 MG tablet Take 1 tablet by mouth as directed. Patient not taking: Reported on 06/16/2021 10/03/19   [provider]    Inpatient medications:  allopurinol  300 mg Oral QHS   carvedilol  6.25 mg Oral BID WC   DULoxetine  30 mg Oral Daily   fluconazole  150 mg Oral Daily   insulin aspart  0-9 Units Subcutaneous TID WC    levocetirizine  5 mg Oral QPM   pantoprazole  40 mg Oral BID    Discontinued Meds:   Medications Discontinued During This Encounter  Medication Reason   acetaminophen (TYLENOL) suppository 650 mg    cyclobenzaprine (FLEXERIL) 10 MG tablet Completed Course   azelastine (ASTELIN) 0.1 % nasal spray Patient Preference   sodium chloride (OCEAN) 0.65 % SOLN nasal spray Patient Preference   DULoxetine (CYMBALTA) 30 MG capsule Discontinued by provider    Social History:  reports that she has never smoked.  She has never used smokeless tobacco. She reports that she does not drink alcohol and does not use drugs.  Family History:   Family History  Problem Relation Age of Onset   Allergic rhinitis Sister    Allergic rhinitis Brother    Hypertension Mother    Heart attack Father    Cirrhosis Father    Emphysema Father    Breast cancer Maternal Aunt    Colon cancer Neg Hx     Pertinent items are noted in HPI. Weight change:  No intake or output data in the 24 hours ending 06/16/21 0917 BP (!) 107/58    Pulse 80    Temp 98 F (36.7 C) (Oral)    Resp 18    Ht 5\' 6"  (1.676 m)    Wt 105.2 kg    SpO2 95%    BMI 37.45 kg/m  Vitals:   06/16/21 0330 06/16/21 0500 06/16/21 0530 06/16/21 0645  BP: (!) 121/52 (!) 107/58 (!) 100/52 (!) 107/58  Pulse: 77 79 77 80  Resp: 15 17 15 18   Temp:      TempSrc:      SpO2: 95% 95% 92% 95%  Weight:      Height:         General appearance: alert, cooperative, and no distress Head: Normocephalic, without obvious abnormality, atraumatic Resp: clear to auscultation bilaterally Cardio: regular rate and rhythm, S1, S2 normal, no murmur, click, rub or gallop GI: soft, non-tender; bowel sounds normal; no masses,  no organomegaly Extremities: edema 1+ pedal edema bilaterally  Labs: Basic Metabolic Panel: Recent Labs  Lab 06/15/21 1306 06/15/21 2056  NA 135 133*  K 3.4* 2.9*  CL 95* 93*  CO2 22 20*  GLUCOSE 128* 166*  BUN 121* 129*  CREATININE 5.87*  5.83*  ALBUMIN 4.0 3.9  CALCIUM 9.0 8.8*   Liver Function Tests: Recent Labs  Lab 06/15/21 1306 06/15/21 2056  AST 178* 177*  ALT 119* 117*  ALKPHOS 90 92  BILITOT 0.7 0.9  PROT 7.1 7.0  ALBUMIN 4.0 3.9   No results for input(s): LIPASE, AMYLASE in the last 168 hours. No results for input(s): AMMONIA in the last 168 hours. CBC: Recent Labs  Lab 06/15/21 2056  WBC 9.3  HGB 10.6*  HCT 31.2*  MCV 96.3  PLT 198   PT/INR: @LABRCNTIP (inr:5) Cardiac Enzymes: )No results for input(s): CKTOTAL, CKMB, CKMBINDEX, TROPONINI in the last 168 hours. CBG: Recent Labs  Lab 06/16/21 0750  GLUCAP 114*    Iron Studies:  Recent Labs  Lab 06/16/21 0320  IRON 58  TIBC 445  FERRITIN 177    Xrays/Other Studies: NM Pulmonary Perfusion  Result Date: 06/16/2021 CLINICAL DATA:  Coursing shortness of breath for 10 days. EXAM: NUCLEAR MEDICINE PERFUSION LUNG SCAN TECHNIQUE: Perfusion images were obtained in multiple projections after intravenous injection of radiopharmaceutical. Ventilation scans intentionally deferred if perfusion scan and chest x-ray adequate for interpretation during COVID 19 epidemic. RADIOPHARMACEUTICALS:  4.4 mCi Tc-59m MAA IV COMPARISON:  Chest x-ray performed same day. FINDINGS: No peripheral wedge-shaped perfusion defect identified in either lung. IMPRESSION: Negative for pulmonary embolus. Electronically Signed   By: Misty Stanley M.D.   On: 06/16/2021 07:46   DG Chest Port 1 View  Result Date: 06/16/2021 CLINICAL DATA:  Dyspnea EXAM: PORTABLE CHEST 1 VIEW COMPARISON:  10/31/2019 FINDINGS: Lungs are well expanded, symmetric, and clear. No pneumothorax or pleural effusion. Cardiac size within normal limits. Pulmonary vascularity is normal. Osseous structures are age-appropriate. No  acute bone abnormality. Surgical clips noted at the left neck base. IMPRESSION: No active disease. Electronically Signed   By: Fidela Salisbury M.D.   On: 06/16/2021 03:05      Assessment/Plan:  AKI/CKD stage IIIb - Pt with history of heavy NSAID use in the past as well as obesity, DM, and HTN.  Scr had been ranging 1.3-1.5 until 02/05/21 when it rose to 1.91 and has had a more recent baseline of 1.7-1.9 until 05/13/21 (after starting valsartan).  UA with +blood, prot, and leukocytes.  Also with a drop in Hgb from 13.7 to 10.7 and heme + stools.  BUN disproportionally elevated.  DDx includes ischemic ATN in setting of ABLA and relative hypotension, acute GN, bilateral RAS with use of ARB. Agree with starting IVF's given profound azotemia and AKI Will order serologies to r/o acute GN Renal US to r/o obstruction Is completely asymptomatic and no indication for dialysis at this time. She may require a renal biopsy if no improvement with IVF's.  Renal dose medications, avoid nephrotoxic agents such as NSAIDs, IV contrast, and phosphate containing bowel preps. SOB and DOE - V/Q scan negative for PE.  BNP mildly elevated at 147.  Possibly due to underlying lung disease as she is not overtly volume overloaded and CXR is clear. Anemia - with heme + stools.  Likely GIB from recent prednisone taper.  Follow H/H and transfuse as needed.  Abnormal LFT's - will check hepatitis panel. Hypokalemia - replete and follow. Hyponatremia - possibly related to AKI or edema.  Hypochloremic.  Follow after IVF's started.    Governor Rooks Haris Baack 06/16/2021, 9:17 AM

## 2021-06-16 NOTE — H&P (Signed)
History and Physical  [facility]  Lauren Hamilton BJY:[id number] DOB: 09-13-1945 DOA: 06/16/2021  PCP: Monico Blitz, MD  Patient coming from: home  Level of care: Telemetry  I have personally briefly reviewed patient's old medical records in [place]  Chief Complaint: dehydration   HPI: Lauren Hamilton is a 76 y.o. female with medical history significant for stage IIIb CKD followed by Dr. 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  Patient had been complaining of progressive symptoms of dyspnea over the past month.  There has been no chest pain symptoms.  No fever or chills.  No cough.  Patient reports that she has been having hemorrhoids and had a colonoscopy in 2019 with polypectomy.  Patient reports urinary urgency but reports that she is having small amounts of urine output.  She has been followed by her nephrologist closely.  ED Course: Patient was afebrile with a temperature of 97.7, pulse 78, blood pressure 122/64, pulse ox 96% on room air.  Sodium 133, potassium 2.9, CO2 20, glucose 166, BUN 129, creatinine 5.83, calcium 8.8, anion gap 20.  AST 177, ALT 117.  Cardiac BNP 147.0.  Hemoglobin 10.6, platelet count 198, hemoglobin A1c 8.1%.  Nuclear medicine scan negative for pulmonary embolus.  SARS 2 coronavirus test negative.  Influenza testing negative.  Portable chest x-ray no acute findings.  Antibody screen A negative.  Urinalysis with budding yeast seen and 21-50 WBC per high-power field and uric acid crystals seen.  Patient tested guaiac positive.  Hospital admission requested for AKI on CKD stage IIIb and GI bleed.  Review of Systems: Review of Systems  Constitutional:  Positive for diaphoresis and  malaise/fatigue.  HENT: Negative.    Eyes: Negative.   Respiratory: Negative.    Cardiovascular: Negative.   Gastrointestinal:  Positive for blood in stool and nausea. Negative for abdominal pain, constipation, diarrhea and melena.  Genitourinary: Negative.   Musculoskeletal: Negative.   Skin:  Negative for rash.  Neurological:  Positive for dizziness and weakness. Negative for seizures and loss of consciousness.  Endo/Heme/Allergies: Negative.   Psychiatric/Behavioral: Negative.    All other systems reviewed and are negative.   Past Medical History:  Diagnosis Date   Arthritis    Bilateral pulmonary embolism ([place]) 10/2017   H/O unprovoked PE in 2017/2018 treated with anticoagulation for one year and 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 off anticoagulation   Bronchitis    Cancer ([place])    Skin   Diabetes mellitus without complication ([place])    Gout    H/O colonoscopy with polypectomy    Hypercholesteremia    Hypertension    Renal disorder    Renal insufficiency     Past Surgical History:  Procedure Laterality Date   bilateral heel spur removed     carpel tunnel Bilateral    CHOLECYSTECTOMY     COLONOSCOPY  2013   [facility]: hyperplastic polyps. advised 5 year follow up colonoscopy.    COLONOSCOPY N/A 06/12/2018   Procedure: COLONOSCOPY;  Surgeon: [name], MD;  Location: [facility];  Service: Endoscopy;  Laterality: N/A;  8:30am   KNEE ARTHROSCOPY Right    PARTIAL HYSTERECTOMY     POLYPECTOMY  06/12/2018   Procedure: POLYPECTOMY;  Surgeon: [name], MD;  Location: [facility];  Service: Endoscopy;;  hep.flex   THYROIDECTOMY, PARTIAL     TONSILLECTOMY       reports that she has never smoked. She has never used smokeless tobacco. She reports that she does not drink alcohol and does not use drugs.  Allergies  Allergen Reactions   Other Itching    Unknown reaction   Latex Rash    Reaction is only with internal  contact Reaction is only with internal contact   Clonidine Derivatives     Dizzy, dry mouth   Etodolac     Unknown reaction   Hydralazine    Hydrocodone Other (See Comments)    Altered mental status-Hallucination, tolerated in low doses    Naproxen Other (See Comments)    Damage to kidney    Statins Other (See Comments)    Unknown reaction, tolerates lovastatin    Tramadol     Patient, "Felt funny, didn't feel right, and felt weird."   Amlodipine Cough   Lisinopril Cough   Losartan Cough    Family History  Problem Relation Age of Onset   Allergic rhinitis Sister    Allergic rhinitis Brother    Hypertension Mother    Heart attack Father    Cirrhosis Father    Emphysema Father    Breast cancer Maternal Aunt    Colon cancer Neg Hx     Prior to Admission medications   Medication Sig Start Date End Date Taking? Authorizing Provider  allopurinol (ZYLOPRIM) 100 MG tablet Take 300 mg by mouth at bedtime.     [provider]  Artificial Tear Solution (SOOTHE XP) SOLN Apply 1-2 drops to eye as needed (dry eyes).     [provider]  azelastine (ASTELIN) 0.1 % nasal spray Place 2 sprays into both nostrils 2 (two) times daily. 07/15/19   [provider]  carvedilol (COREG) 12.5 MG tablet Take 12.5 mg by mouth 2 (two) times daily with a meal.    [provider]  chlorthalidone (HYGROTON) 25 MG tablet TAKE 1 TABLET BY MOUTH DAILY 04/19/21   Arnoldo Lenis, MD  colchicine-probenecid 0.5-500 MG tablet Take 1 tablet by mouth as directed. 10/03/19   [provider]  cyclobenzaprine (FLEXERIL) 10 MG tablet Take 5 mg by mouth 2 (two) times daily as needed for muscle spasms.  04/04/16   [provider]  DULoxetine (CYMBALTA) 30 MG capsule Take 30 mg by mouth daily. 07/22/20   [provider]  ferrous sulfate 325 (65 FE) MG tablet Take 325 mg by mouth every 14 (fourteen) days.    [provider]  furosemide (LASIX) 20 MG  tablet Take 20 mg by mouth 2 (two) times daily.    [provider]  levocetirizine (XYZAL) 5 MG tablet Take 5 mg by mouth every evening.    [provider]  omeprazole (PRILOSEC) 40 MG capsule Take 40 mg by mouth daily.    [provider]  potassium chloride 20 MEQ/15ML (10%) SOLN Take 20 mEq by mouth 2 (two) times daily.    [provider]  rivaroxaban (XARELTO) 20 MG TABS tablet Take 1 tablet (20 mg total) by mouth daily with supper. Start using after you complete the starter pack 10/14/17   Isaac Bliss, Rayford Halsted, MD  sodium chloride (OCEAN) 0.65 % SOLN nasal spray Place 1 spray into both nostrils as needed for congestion.    [provider]  spironolactone (ALDACTONE) 25 MG tablet Take 12.5 mg by  mouth every evening.    [provider]  SUMAtriptan (IMITREX) 100 MG tablet Take 1 tablet by mouth daily. Take 1 tab at the first sign of headache 07/07/19   [provider]  vitamin E 400 UNIT capsule Take 400 Units by mouth daily.    [provider]    Physical Exam: Vitals:   06/16/21 0330 06/16/21 0500 06/16/21 0530 06/16/21 0645  BP: (!) 121/52 (!) 107/58 (!) 100/52 (!) 107/58  Pulse: 77 79 77 80  Resp: 15 17 15 18   Temp:      TempSrc:      SpO2: 95% 95% 92% 95%  Weight:      Height:        Constitutional: elderly female, awake, alert, cooperative, NAD, calm, comfortable. Appears pale.  Eyes: PERRL, lids and conjunctivae normal ENMT: Mucous membranes are moist. Posterior pharynx clear of any exudate or lesions.Normal dentition.  Neck: normal, supple, no masses, no thyromegaly Respiratory: clear to auscultation bilaterally, no wheezing, no crackles. Normal respiratory effort. No accessory muscle use.  Cardiovascular: normal s1, s2 sounds, no murmurs / rubs / gallops. No extremity edema. 2+ pedal pulses. No carotid bruits.  Abdomen: no tenderness, no masses palpated. No hepatosplenomegaly. Bowel sounds positive.   Musculoskeletal: no clubbing / cyanosis. No joint deformity upper and lower extremities. Good ROM, no contractures. Normal muscle tone.  Skin: no rashes, lesions, ulcers. No induration Neurologic: CN 2-12 grossly intact. Sensation intact, DTR normal. Strength 5/5 in all 4.  Psychiatric: Normal judgment and insight. Alert and oriented x 3. Normal mood.   Labs on Admission: I have personally reviewed following labs and imaging studies  CBC: Recent Labs  Lab 06/15/21 2056  WBC 9.3  HGB 10.6*  HCT 31.2*  MCV 96.3  PLT 378   Basic Metabolic Panel: Recent Labs  Lab 06/15/21 1306 06/15/21 2056  NA 135 133*  K 3.4* 2.9*  CL 95* 93*  CO2 22 20*  GLUCOSE 128* 166*  BUN 121* 129*  CREATININE 5.87* 5.83*  CALCIUM 9.0 8.8*  MG  --  2.1   GFR: Estimated Creatinine Clearance: 10.4 mL/min (A) (by C-G formula based on SCr of 5.83 mg/dL (H)). Liver Function Tests: Recent Labs  Lab 06/15/21 1306 06/15/21 2056  AST 178* 177*  ALT 119* 117*  ALKPHOS 90 92  BILITOT 0.7 0.9  PROT 7.1 7.0  ALBUMIN 4.0 3.9   No results for input(s): LIPASE, AMYLASE in the last 168 hours. No results for input(s): AMMONIA in the last 168 hours. Coagulation Profile: No results for input(s): INR, PROTIME in the last 168 hours. Cardiac Enzymes: No results for input(s): CKTOTAL, CKMB, CKMBINDEX, TROPONINI in the last 168 hours. BNP (last 3 results) No results for input(s): PROBNP in the last 8760 hours. HbA1C: No results for input(s): HGBA1C in the last 72 hours. CBG: No results for input(s): GLUCAP in the last 168 hours. Lipid Profile: No results for input(s): CHOL, HDL, LDLCALC, TRIG, CHOLHDL, LDLDIRECT in the last 72 hours. Thyroid Function Tests: No results for input(s): TSH, T4TOTAL, FREET4, T3FREE, THYROIDAB in the last 72 hours. Anemia Panel: Recent Labs    06/16/21 0320 06/16/21 0330  VITAMINB12 440  --   FOLATE  --  22.0  FERRITIN 177  --   TIBC 445  --   IRON 58  --   RETICCTPCT   --  2.1   Urine analysis:    Component Value Date/Time   COLORURINE YELLOW 06/16/2021 0328   APPEARANCEUR  HAZY (A) 06/16/2021 0328   LABSPEC 1.010 06/16/2021 0328   PHURINE 5.0 06/16/2021 0328   GLUCOSEU NEGATIVE 06/16/2021 0328   HGBUR LARGE (A) 06/16/2021 0328   BILIRUBINUR NEGATIVE 06/16/2021 0328   KETONESUR NEGATIVE 06/16/2021 0328   PROTEINUR 30 (A) 06/16/2021 0328   NITRITE NEGATIVE 06/16/2021 0328   LEUKOCYTESUR MODERATE (A) 06/16/2021 0328    Radiological Exams on Admission: DG Chest Port 1 View  Result Date: 06/16/2021 CLINICAL DATA:  Dyspnea EXAM: PORTABLE CHEST 1 VIEW COMPARISON:  10/31/2019 FINDINGS: Lungs are well expanded, symmetric, and clear. No pneumothorax or pleural effusion. Cardiac size within normal limits. Pulmonary vascularity is normal. Osseous structures are age-appropriate. No acute bone abnormality. Surgical clips noted at the left neck base. IMPRESSION: No active disease. Electronically Signed   By: Fidela Salisbury M.D.   On: 06/16/2021 03:05     Assessment/Plan Principal Problem:   Acute-on-chronic kidney injury ([place]) Active Problems:   Pulmonary embolism (HCC)   Hypertension   Arthritis   Chronic rhinitis   Long term current use of anticoagulant therapy   Diabetes mellitus without complication (HCC)   Anemia in chronic kidney disease (CKD)   Stage 3b chronic kidney disease (CKD) (HCC)   Guaiac positive stools   Hx of colonic polyps   Elevated transaminase level   Hypokalemia   Gout   Hyperuricemia   Dyspnea    AKI on CKD stage IIIb -Appreciate nephrology consultation with Dr. Marval Regal -Gentle IV fluid hydration ordered -Renally dose medications as appropriate avoid nephrotoxic agent -Follow renal function panel closely -Check renal ultrasound to rule out obstruction  Dyspnea -Acute pulmonary embolism has been ruled out -No signs or symptoms of heart failure -Supplemental oxygen as needed -Follow-up chest x-ray in a.m.  Anemia  in CKD -Hemoccult positive stools concerning given patient is on Xarelto -I have asked for GI consultation for assistance with management temporarily holding Xarelto at this time -Has a history of recurrent venous thromboembolism with stopping Xarelto -Follow CBC closely -Anemia panel is reassuring  Elevated LFTs -Follow-up on hepatitis panel -GI consultation as above -Repeat in a.m.  Hypokalemia -This has been repleted -Recheck in a.m.  Metabolic acidosis -Secondary to AKI -Mild treating with IV fluid hydration, recheck in a.m.  Type 2 diabetes mellitus with renal complications, uncontrolled -As evidenced by hemoglobin A1c of 8.1% -Continue SSI coverage and CBG monitoring -Add basal and prandial coverage for better glycemic control -Check lipid panel in a.m.  UTI-fungal -Fluconazole daily x3 days -Glycemic control orders  Gout -Continue daily allopurinol to treat hyperuricemia   DVT prophylaxis: SCD  Code Status: Full   Family Communication: patient updated bedside   Disposition Plan: anticipating DC home   Consults called: nephrology, gastroenterology   Admission status: INP    Level of care: Telemetry Irwin Brakeman MD [facility] How to contact the [facility] Attending or Consulting provider [place] or covering provider during after hours [place], for this patient?  Check the care team in [facility] and look for a) attending/consulting [facility] provider listed and b) the [facility] team listed Log into [email or web address] and use [place]'s universal password to access. If you do not have the password, please contact the hospital operator. Locate the [facility] provider you are looking for under [facility] and page to a number that you can be directly reached. If you still have difficulty reaching the provider, please page the [facility] (Director on Call) for the Hospitalists listed on amion for assistance.   If 7PM-7AM,  please contact night-coverage [email or web address] Password  [facility]  06/16/2021, 7:36 AM

## 2021-06-16 NOTE — ED Provider Notes (Signed)
[facility] Provider Note   CSN: [id number] Arrival date & time: 06/15/21  1737     History  Chief Complaint  Patient presents with   Dehydration    Lauren Hamilton is a 76 y.o. female.  The history is provided by the patient.  She has history of hypertension, diabetes, chronic kidney disease, pulmonary embolism anticoagulated on rivaroxaban and was sent to the emergency department because of abnormal kidney blood test.  She has been having shortness of breath for the last month, worse with even minimal exertion.  She saw her primary care provider who sent her for CT angiogram of the chest to rule out recurrent pulmonary embolism.  Labs obtained in order to get IV dye showed her kidney function was to low to safely be given IV dye and she was referred to the emergency department.  She denies cough and denies fever or chills.  She denies chest pain, heaviness, tightness, pressure.  She has noted some increasing ankle edema.  She has noticed intermittent urinary urgency with only a small amount of urine coming out.  Of note, she states that her nephrologist has not discussed dialysis with her.   Home Medications Prior to Admission medications   Medication Sig Start Date End Date Taking? Authorizing Provider  allopurinol (ZYLOPRIM) 100 MG tablet Take 300 mg by mouth at bedtime.     [provider]  Artificial Tear Solution (SOOTHE XP) SOLN Apply 1-2 drops to eye as needed (dry eyes).     [provider]  azelastine (ASTELIN) 0.1 % nasal spray Place 2 sprays into both nostrils 2 (two) times daily. 07/15/19   [provider]  carvedilol (COREG) 12.5 MG tablet Take 12.5 mg by mouth 2 (two) times daily with a meal.    [provider]  chlorthalidone (HYGROTON) 25 MG tablet TAKE 1 TABLET BY MOUTH DAILY 04/19/21   Arnoldo Lenis, MD  colchicine-probenecid 0.5-500 MG tablet Take 1 tablet by mouth as directed. 10/03/19   [provider]  cyclobenzaprine (FLEXERIL) 10 MG tablet Take 5 mg by mouth 2 (two) times daily as needed for muscle spasms.  04/04/16   [provider]  DULoxetine (CYMBALTA) 30 MG capsule Take 30 mg by mouth daily. 07/22/20   [provider]  ferrous sulfate 325 (65 FE) MG tablet Take 325 mg by mouth every 14 (fourteen) days.    [provider]  furosemide (LASIX) 20 MG tablet Take 20 mg by mouth 2 (two) times daily.    [provider]  levocetirizine (XYZAL) 5 MG tablet Take 5 mg by mouth every evening.    [provider]  omeprazole (PRILOSEC) 40 MG capsule Take 40 mg by mouth daily.    [provider]  potassium chloride 20 MEQ/15ML (10%) SOLN Take 20 mEq by mouth 2 (two) times daily.    [provider]  rivaroxaban (XARELTO) 20 MG TABS tablet Take 1 tablet (20 mg total) by mouth daily with supper. Start using after you complete the starter pack 10/14/17   Isaac Bliss, Rayford Halsted, MD  sodium chloride (OCEAN) 0.65 % SOLN nasal spray Place 1 spray into both nostrils as needed for congestion.    [provider]  spironolactone (ALDACTONE) 25 MG tablet Take 12.5 mg by mouth every evening.    [provider]  SUMAtriptan (IMITREX) 100 MG tablet Take 1 tablet by mouth daily. Take 1 tab at the first sign of headache 07/07/19  [provider]  vitamin E 400 UNIT capsule Take 400 Units by mouth daily.    [provider]      Allergies    Other, Latex, Clonidine derivatives, Etodolac, Hydralazine, Hydrocodone, Naproxen, Statins, Tramadol, Amlodipine, Lisinopril, and Losartan    Review of Systems   Review of Systems  All other systems reviewed and are negative.  Physical Exam Updated Vital Signs BP 117/67    Pulse 78    Temp 98 F (36.7 C) (Oral)    Resp 18    Ht 5\' 6"  (1.676 m)    Wt 105.2 kg    SpO2 97%    BMI 37.45 kg/m  Physical Exam Vitals and nursing note reviewed. Exam conducted with a chaperone present.   76 year old female, resting comfortably and in no acute distress. Vital signs are normal. Oxygen saturation is 97%, which is normal. Head is normocephalic and atraumatic. PERRLA, EOMI. Oropharynx is clear. Neck is nontender and supple without adenopathy or JVD. Back is nontender and there is no CVA tenderness.  There is 1+ presacral edema. Lungs are clear without rales, wheezes, or rhonchi. Chest is nontender. Heart has regular rate and rhythm without murmur. Abdomen is soft, flat, nontender without masses or hepatosplenomegaly and peristalsis is normoactive. Rectal: Normal sphincter tone.  Small amount of brown stool is present which is Hemoccult positive. Extremities have 2+ edema, full range of motion is present.  There is no calf tenderness.  Bevelyn Buckles' sign is negative.  There are no palpable cords. Skin is warm and dry without rash. Neurologic: Mental status is normal, cranial nerves are intact, moves all extremities equally.  ED Results / Procedures / Treatments   Labs (all labs ordered are listed, but only abnormal results are displayed) Labs Reviewed  CBC - Abnormal; Notable for the following components:      Result Value   RBC 3.24 (*)    Hemoglobin 10.6 (*)    HCT 31.2 (*)    RDW 16.8 (*)    All other components within normal limits  COMPREHENSIVE METABOLIC PANEL - Abnormal; Notable for the following components:   Sodium 133 (*)    Potassium 2.9 (*)    Chloride 93 (*)    CO2 20 (*)    Glucose, Bld 166 (*)    BUN 129 (*)    Creatinine, Ser 5.83 (*)    Calcium 8.8 (*)    AST 177 (*)    ALT 117 (*)    GFR, Estimated 7 (*)    Anion gap 20 (*)    All other components within normal limits  BRAIN NATRIURETIC PEPTIDE - Abnormal; Notable for the following components:   B Natriuretic Peptide 147.0 (*)    All other components within normal limits  URINALYSIS, ROUTINE W REFLEX MICROSCOPIC - Abnormal; Notable for the following components:   APPearance HAZY (*)    Hgb urine  dipstick LARGE (*)    Protein, ur 30 (*)    Leukocytes,Ua MODERATE (*)    Bacteria, UA RARE (*)    All other components within normal limits  RETICULOCYTES - Abnormal; Notable for the following components:   RBC. 3.24 (*)    Immature Retic Fract 16.1 (*)    All other components within normal limits  RESP PANEL BY RT-PCR (FLU A&B, COVID) ARPGX2  MAGNESIUM  VITAMIN B12  FOLATE  IRON AND TIBC  FERRITIN  POC OCCULT BLOOD, ED  TYPE AND SCREEN    EKG EKG Interpretation  Date/Time:  Wednesday June 16 2021 03:46:43 EST Ventricular Rate:  77 PR Interval:  187 QRS Duration: 112 QT Interval:  424 QTC Calculation: 480 R Axis:   -21 Text Interpretation: Sinus rhythm Borderline intraventricular conduction delay When compared with ECG of 10/31/2019, No significant change was found Confirmed by Delora Fuel ([id number]) on 06/16/2021 3:49:50 AM  Radiology DG Chest Port 1 View  Result Date: 06/16/2021 CLINICAL DATA:  Dyspnea EXAM: PORTABLE CHEST 1 VIEW COMPARISON:  10/31/2019 FINDINGS: Lungs are well expanded, symmetric, and clear. No pneumothorax or pleural effusion. Cardiac size within normal limits. Pulmonary vascularity is normal. Osseous structures are age-appropriate. No acute bone abnormality. Surgical clips noted at the left neck base. IMPRESSION: No active disease. Electronically Signed   By: Fidela Salisbury M.D.   On: 06/16/2021 03:05    Procedures Procedures    Medications Ordered in ED Medications  acetaminophen (TYLENOL) suppository 650 mg (has no administration in time range)  potassium chloride SA (KLOR-CON M) CR tablet 40 mEq (40 mEq Oral Given 06/16/21 0301)  pantoprazole (PROTONIX) EC tablet 40 mg (40 mg Oral Given 06/16/21 0403)  technetium albumin aggregated (MAA) injection solution 4 millicurie (4.4 millicuries Intravenous Contrast Given 06/16/21 0710)    ED Course/ Medical Decision Making/ A&P                           Medical Decision Making  Shortness of breath, poor  renal function.  In the setting of chronic anticoagulation, likelihood of pulmonary embolism is very small.  However, with worsening renal function and peripheral edema, I am concerned about heart failure.  Labs were ordered and reviewed by myself.  Creatinine prior to CT scanning this afternoon was 5.87, repeat after arrival in the ED showed creatinine still elevated at 5.83.  Mild hyponatremia is present which is not felt to be clinically significant.  She has moderate to severe hypokalemia and oral potassium is given.  In addition, mild to moderate elevation of transaminases is present.  This is likely secondary to passive congestion of the liver, but possibly secondary to underlying hepatitis.  Hemoglobin has come back at 10.6 with normal RBC indices but slightly elevated RDW.  In our system, last hemoglobin was 13.7 on 11/02/2019 and last creatinine was 1.27 on 03/11/2020.  Lab results on care everywhere were checked, and creatinine was 3.11 on 05/27/2021, 1.81 on 04/21/2021, 1.73 03/12/2021.  Hemoglobin was 13.7 on 04/21/2021.  Because of drop in hemoglobin, stool Hemoccult was checked and is positive.  Drop in hemoglobin is probably secondary to combination of worsening renal function and GI blood loss.  Patient does relate a history of needing iron, but not taking it because of constipation.  Given her significant worsening of renal function, she will need to be admitted.  We will check chest x-ray and BNP as well as ECG.  Given inability to do CT angiogram, V/Q lung scan has been ordered.  We will also get bilateral venous Dopplers.  If she actually is having breakthrough pulmonary emboli, will need to be considered for inferior vena cava filter.  Chest x-ray shows borderline cardiomegaly but unchanged from prior based on my interpretation, radiologist interpretation pending.  Radiologist agrees with my interpretation.  ECG is unchanged from prior.  BNP is only modestly elevated.  Anemia profile does show  slightly low iron saturation.  Urinalysis shows 21-50 WBCs as well as presence of budding yeast.  Case is discussed with Dr. Josephine Cables of  [facility], who agrees to admit the patient.        Final Clinical Impression(s) / ED Diagnoses Final diagnoses:  Acute renal failure superimposed on chronic kidney disease, unspecified CKD stage, unspecified acute renal failure type (HCC)  Shortness of breath  Hypokalemia  Normochromic normocytic anemia  Guaiac positive stools  Elevated transaminase level  Hyponatremia    Rx / DC Orders ED Discharge Orders     None         Delora Fuel, MD 79/48/01 [phone]

## 2021-06-16 NOTE — Consult Note (Signed)
Referring Provider: No ref. provider found Primary Care Physician:  Monico Blitz, MD Primary Gastroenterologist:  Dr. Gala Romney  Date of Admission: 06/15/21 Date of Consultation: 06/16/21  Reason for Consultation:  anemia/FOBT positive/elevated liver enzymes  HPI:  Lauren Hamilton is a 76 y.o. 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 She denied n/v/d rectal bleeding, melena, hematuria, denies NSAID use but had taken them regularly prior to 2020 for about 20 years.  ED course: Hemodynamically stable, BP soft but other VSS Hgb 10.6, FOBT positive LFTs initially elevated with AST 177, ALT 117, BUN 129, Creat 5.83 B12 WNL, Ferritin 177, Iron 58, TIBC 445, Saturation 13, folate 22 Acute hepatitis panel in process  Consult: Patient states she has been very short of breath since christmas, she also reports worsening swelling to her legs and feet. She is currently on Xarelto for hx of PEs, last dose was yesterday evening at 1800. She states that she has had no rectal bleeding or melena. She does endorse more fatigue recently. She is supposed to be on PO iron supplements but states that due to constipation she does not take it everyday. She does endorse some occasional abdominal pain in the lower abdomen. She denies diarrhea, typically has 1 BM every 3-4 days and takes slippery elm to help with constipation. She denies straining. States her appetite has been good. weight has been stable. She does endorse some issues with reflux but this is typically well  managed on omeprazole 40mg  once a day. She denies any issues with dysphagia. Denies post prandial abdominal pain. Occasional early satiety. Denies any NSAID use, takes occasional tylenol for migraines. She also takes vitamin E, C and magnesium each morning. Denies any  other supplements. Notably, she was on prednisone and an antibiotic in December for sinusitis, however, she is unsure exactly which antibiotic she was on.  Social hx: no alcohol or tobacco  Family hx:no CRC or liver disease  Last colonoscopy:06/12/18- Three 4 to 6 mm polyps at the hepatic flexure - One 7 mm polyp at the hepatic flexure, all tubular adenomas - Non-bleeding external and internal hemorrhoids. - The examination was otherwise normal on direct and retroflexion views. Last EGD:n/a  Past Medical History:  Diagnosis Date   Arthritis    Bilateral pulmonary embolism ([place]) 10/2017   H/O unprovoked PE in 2017/2018 treated with anticoagulation for one year and 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 off anticoagulation   Bronchitis    Cancer ([place])    Skin   Diabetes mellitus without complication ([place])    Gout    H/O colonoscopy with polypectomy    Hypercholesteremia    Hypertension    Renal disorder    Renal insufficiency     Past Surgical History:  Procedure Laterality Date   bilateral heel spur removed     carpel tunnel Bilateral    CHOLECYSTECTOMY     COLONOSCOPY  2013   [facility]: hyperplastic polyps. advised 5 year follow up colonoscopy.    COLONOSCOPY N/A 06/12/2018   Procedure: COLONOSCOPY;  Surgeon: [name], MD;  Location: [facility];  Service: Endoscopy;  Laterality: N/A;  8:30am   KNEE ARTHROSCOPY Right    PARTIAL HYSTERECTOMY     POLYPECTOMY  06/12/2018   Procedure: POLYPECTOMY;  Surgeon: Manus Rudd  M, MD;  Location: [facility];  Service: Endoscopy;;  hep.flex   THYROIDECTOMY, PARTIAL     TONSILLECTOMY      Prior to Admission medications   Medication Sig Start Date End Date Taking? Authorizing Provider  allopurinol (ZYLOPRIM) 100 MG tablet Take 300 mg by mouth at bedtime.    Yes [provider]  Artificial Tear Solution (SOOTHE XP) SOLN Apply 1-2 drops to eye as needed (dry eyes).    Yes [provider]  Ascorbic Acid (VITAMIN C PO) Take 1 tablet by mouth daily.   Yes [provider]  carvedilol (COREG) 12.5 MG tablet Take 12.5 mg by mouth 2 (two) times daily with a meal.   Yes [provider]  chlorthalidone (HYGROTON) 25 MG tablet TAKE 1 TABLET BY MOUTH DAILY 04/19/21  Yes Branch, Alphonse Guild, MD  ferrous sulfate 325 (65 FE) MG tablet Take 325 mg by mouth daily.   Yes [provider]  furosemide (LASIX) 20 MG tablet Take 20 mg by mouth 2 (two) times daily.   Yes [provider]  levocetirizine (XYZAL) 5 MG tablet Take 5 mg by mouth every evening.   Yes [provider]  MAGNESIUM PO Take 1 tablet by mouth daily.   Yes [provider]  omeprazole (PRILOSEC) 40 MG capsule Take 40 mg by mouth daily.   Yes [provider]  potassium chloride 20 MEQ/15ML (10%) SOLN Take 20 mEq by mouth 2 (two) times daily.   Yes [provider]  rivaroxaban (XARELTO) 20 MG TABS tablet Take 1 tablet (20 mg total) by mouth daily with supper. Start using after you complete the starter pack Patient taking differently: Take 20 mg by mouth daily with supper. 10/14/17  Yes Isaac Bliss, Rayford Halsted, MD  rosuvastatin (CRESTOR) 40 MG tablet Take 40 mg by mouth daily. 06/01/21  Yes [provider]  spironolactone (ALDACTONE) 25 MG tablet Take 12.5 mg by mouth every evening.   Yes [provider]  SUMAtriptan (IMITREX) 100 MG tablet Take 1 tablet by mouth every 2 (two) hours as needed for migraine. 07/07/19  Yes [provider]  vitamin E 400 UNIT capsule Take 400 Units by mouth daily.   Yes [provider]  colchicine-probenecid 0.5-500 MG tablet Take 1 tablet by mouth as directed. Patient not taking: Reported on 06/16/2021 10/03/19   [provider]    Current Facility-Administered Medications  Medication Dose Route Frequency Provider Last Rate Last Admin   acetaminophen (TYLENOL) tablet 650 mg  650  mg Oral Q6H PRN Wynetta Emery, Clanford L, MD   650 mg at 06/16/21 8144   Or   acetaminophen (TYLENOL) suppository 650 mg  650 mg Rectal Q6H PRN Johnson, Clanford L, MD       allopurinol (ZYLOPRIM) tablet 300 mg  300 mg Oral QHS Johnson, Clanford L, MD       azelastine (ASTELIN) 0.1 % nasal spray 2 spray  2 spray Each Nare BID PRN Johnson, Clanford L, MD       carvedilol (COREG) tablet 6.25 mg  6.25 mg Oral BID WC Johnson, Clanford L, MD       DULoxetine (CYMBALTA) DR capsule 30 mg  30 mg Oral Daily Johnson, Clanford L, MD   30 mg at 06/16/21 1039   fluconazole (DIFLUCAN) tablet 75 mg  75 mg Oral Daily Johnson, Clanford L, MD   75 mg at 06/16/21 1039   insulin aspart (novoLOG) injection 0-9 Units  0-9 Units Subcutaneous  TID WC Johnson, Clanford L, MD       lactated ringers infusion   Intravenous Continuous Wynetta Emery, Clanford L, MD 70 mL/hr at 06/16/21 1039 New Bag at 06/16/21 1039   loratadine (CLARITIN) tablet 10 mg  10 mg Oral QPM Johnson, Clanford L, MD       metoprolol tartrate (LOPRESSOR) injection 5 mg  5 mg Intravenous Q6H PRN Johnson, Clanford L, MD       ondansetron (ZOFRAN) tablet 4 mg  4 mg Oral Q6H PRN Johnson, Clanford L, MD   4 mg at 06/16/21 0800   Or   ondansetron (ZOFRAN) injection 4 mg  4 mg Intravenous Q6H PRN Johnson, Clanford L, MD       pantoprazole (PROTONIX) EC tablet 40 mg  40 mg Oral BID Johnson, Clanford L, MD       polyvinyl alcohol (LIQUIFILM TEARS) 1.4 % ophthalmic solution 1-2 drop  1-2 drop Both Eyes PRN Johnson, Clanford L, MD       sodium chloride (OCEAN) 0.65 % nasal spray 1 spray  1 spray Each Nare PRN Johnson, Clanford L, MD       Current Outpatient Medications  Medication Sig Dispense Refill   allopurinol (ZYLOPRIM) 100 MG tablet Take 300 mg by mouth at bedtime.      Artificial Tear Solution (SOOTHE XP) SOLN Apply 1-2 drops to eye as needed (dry eyes).      Ascorbic Acid (VITAMIN C PO) Take 1 tablet by mouth daily.     carvedilol (COREG) 12.5 MG tablet Take 12.5  mg by mouth 2 (two) times daily with a meal.     chlorthalidone (HYGROTON) 25 MG tablet TAKE 1 TABLET BY MOUTH DAILY 90 tablet 3   ferrous sulfate 325 (65 FE) MG tablet Take 325 mg by mouth daily.     furosemide (LASIX) 20 MG tablet Take 20 mg by mouth 2 (two) times daily.     levocetirizine (XYZAL) 5 MG tablet Take 5 mg by mouth every evening.     MAGNESIUM PO Take 1 tablet by mouth daily.     omeprazole (PRILOSEC) 40 MG capsule Take 40 mg by mouth daily.     potassium chloride 20 MEQ/15ML (10%) SOLN Take 20 mEq by mouth 2 (two) times daily.     rivaroxaban (XARELTO) 20 MG TABS tablet Take 1 tablet (20 mg total) by mouth daily with supper. Start using after you complete the starter pack (Patient taking differently: Take 20 mg by mouth daily with supper.) 30 tablet 3   rosuvastatin (CRESTOR) 40 MG tablet Take 40 mg by mouth daily.     spironolactone (ALDACTONE) 25 MG tablet Take 12.5 mg by mouth every evening.     SUMAtriptan (IMITREX) 100 MG tablet Take 1 tablet by mouth every 2 (two) hours as needed for migraine.     vitamin E 400 UNIT capsule Take 400 Units by mouth daily.     colchicine-probenecid 0.5-500 MG tablet Take 1 tablet by mouth as directed. (Patient not taking: Reported on 06/16/2021)      Allergies as of 06/15/2021 - Review Complete 06/15/2021  Allergen Reaction Noted   Other Itching 10/13/2017   Latex Rash 06/15/2011   Clonidine derivatives  10/31/2019   Etodolac  04/12/2018   Hydralazine  03/17/2020   Hydrocodone Other (See Comments) 10/13/2017   Naproxen Other (See Comments) 11/10/2011   Statins Other (See Comments) 10/13/2017   Tramadol  10/20/2011   Amlodipine Cough 01/25/2018   Lisinopril Cough 01/25/2018  Losartan Cough 01/25/2018    Family History  Problem Relation Age of Onset   Allergic rhinitis Sister    Allergic rhinitis Brother    Hypertension Mother    Heart attack Father    Cirrhosis Father    Emphysema Father    Breast cancer Maternal Aunt     Colon cancer Neg Hx     Social History   Socioeconomic History   Marital status: Widowed    Spouse name: Not on file   Number of children: Not on file   Years of education: Not on file   Highest education level: Not on file  Occupational History   Not on file  Tobacco Use   Smoking status: Never   Smokeless tobacco: Never  Vaping Use   Vaping Use: Never used  Substance and Sexual Activity   Alcohol use: Never   Drug use: Never   Sexual activity: Not Currently  Other Topics Concern   Not on file  Social History Narrative   Not on file   Social Determinants of Health   Financial Resource Strain: Not on file  Food Insecurity: Not on file  Transportation Needs: Not on file  Physical Activity: Not on file  Stress: Not on file  Social Connections: Not on file  Intimate Partner Violence: Not on file    Review of Systems: Gen: Denies fever, chills, loss of appetite, change in weight or weight loss CV: Denies chest pain, heart palpitations, syncope, edema  Resp: Denies cough, wheezing +SOB GI: Denies melena, hematochezia, nausea, vomiting, diarrhea, dysphagia, odyonophagia, early satiety or weight loss. +constipation GU : Denies urinary burning, urinary frequency, urinary incontinence.  MS: Denies joint pain,swelling, cramping Derm: Denies rash, itching, dry skin Psych: Denies depression, anxiety,confusion, or memory loss Heme: Denies bruising, bleeding, and enlarged lymph nodes.  Physical Exam: Vital signs in last 24 hours: Temp:  [98 F (36.7 C)-98.1 F (36.7 C)] 98 F (36.7 C) (01/04 0152) Pulse Rate:  [77-86] 85 (01/04 0900) Resp:  [15-22] 18 (01/04 0900) BP: (100-121)/(51-94) 105/82 (01/04 0900) SpO2:  [92 %-98 %] 93 % (01/04 0900) Weight:  [105.2 kg] 105.2 kg (01/03 1925)   General:   Alert,  Well-developed, well-nourished, pleasant and cooperative in NAD Head:  Normocephalic and atraumatic. Eyes:  Sclera clear, no icterus.   Conjunctiva pink. Ears:   Normal auditory acuity. Nose:  No deformity, discharge,  or lesions. Mouth:  No deformity or lesions, dentition normal. Lungs:  Clear throughout to auscultation.   No wheezes, crackles, or rhonchi. No acute distress. Heart:  Regular rate and rhythm; no murmurs, clicks, rubs,  or gallops. Abdomen:  Soft, nontender and nondistended. No masses, hepatosplenomegaly or hernias noted. Normal bowel sounds, without guarding, and without rebound.   Rectal:  Deferred until time of colonoscopy.   Msk:  Symmetrical without gross deformities. Normal posture. Pulses:  Normal pulses noted. Extremities:  Without clubbing or edema. Neurologic:  Alert and  oriented x4;  grossly normal neurologically. Skin:  Intact without significant lesions or rashes. Psych:  Alert and cooperative. Normal mood and affect.  Intake/Output from previous day: No intake/output data recorded. Intake/Output this shift: No intake/output data recorded.  Lab Results: Recent Labs    06/15/21 2056  WBC 9.3  HGB 10.6*  HCT 31.2*  PLT 198   BMET Recent Labs    06/15/21 1306 06/15/21 2056  NA 135 133*  K 3.4* 2.9*  CL 95* 93*  CO2 22 20*  GLUCOSE 128* 166*  BUN  121* 129*  CREATININE 5.87* 5.83*  CALCIUM 9.0 8.8*   LFT Recent Labs    06/15/21 1306 06/15/21 2056  PROT 7.1 7.0  ALBUMIN 4.0 3.9  AST 178* 177*  ALT 119* 117*  ALKPHOS 90 92  BILITOT 0.7 0.9   Studies/Results: NM Pulmonary Perfusion  Result Date: 06/16/2021 CLINICAL DATA:  Coursing shortness of breath for 10 days. EXAM: NUCLEAR MEDICINE PERFUSION LUNG SCAN TECHNIQUE: Perfusion images were obtained in multiple projections after intravenous injection of radiopharmaceutical. Ventilation scans intentionally deferred if perfusion scan and chest x-ray adequate for interpretation during COVID 19 epidemic. RADIOPHARMACEUTICALS:  4.4 mCi Tc-64m MAA IV COMPARISON:  Chest x-ray performed same day. FINDINGS: No peripheral wedge-shaped perfusion defect identified  in either lung. IMPRESSION: Negative for pulmonary embolus. Electronically Signed   By: Misty Stanley M.D.   On: 06/16/2021 07:46   DG Chest Port 1 View  Result Date: 06/16/2021 CLINICAL DATA:  Dyspnea EXAM: PORTABLE CHEST 1 VIEW COMPARISON:  10/31/2019 FINDINGS: Lungs are well expanded, symmetric, and clear. No pneumothorax or pleural effusion. Cardiac size within normal limits. Pulmonary vascularity is normal. Osseous structures are age-appropriate. No acute bone abnormality. Surgical clips noted at the left neck base. IMPRESSION: No active disease. Electronically Signed   By: Fidela Salisbury M.D.   On: 06/16/2021 03:05    Impression: JALEXA PIFER is a 76 y.o. 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 Found to have elevated transaminases and mild anemia with positive FOBT. GI consulted for further evaluation.   Anemia:hgb 10.9, Ferritin 177, Iron 58, TIBC 445, Saturation 13, history of IDA, with influence of CKD, however, she reports no previous workup to rule out other causes, she is supposed to be on oral iron but does not take it everyday due to secondary constipation. she denies melena or rectal bleeding, has history of both internal and external hemorrhoids, I am not convinced positive FOBT was not from this, anemia is likely influenced by her chronic kidney disease as well, however, given she is on Xarelto, we cannot rule out PUD, AVM, Colonic polyps, gastritis, duodenitis or malignancy, though this is less likely. Given her current renal function, endoscopic procedures will need to be held until creatnine improves, however, she would benefit from further investigation with EGD and Colonoscopy, possibly on outpatient basis.   Elevated LFTs: she was recently on abx for sinusitis before  christmas, though she is unsure what medication it was. She takes slippery elm for constipation but denies any other supplements or medications. She does not drink alcohol and has no hx of IVDU. Acute hepatitis panel is in process. Will also order AIH serologies for further evaluation. Elevation in transaminases could be secondary to acute on chronic kidney injury, however, medication induced liver injury cannot be ruled out, though we do not know what antibiotic she was on. Daughter did contact patient's pharmacy but they were unable to tell her what medication patient had been on.   Plan: Trend H&H, monitor for overt GI bleeding Trend LFTs daily Follow for acute hepatitis panel AIH serologies Consider EGD and colonoscopy, likely outpatient unless hgb continues to trend down or overt GI bleeding occurs Hold Xarelto for now   LOS: 0 days    06/16/2021, 11:34 AM   Jizel Cheeks L. Alver Sorrow, MSN, APRN, AGNP-C Adult-Gerontology  Nurse Practitioner [facility]

## 2021-06-17 ENCOUNTER — Inpatient Hospital Stay (HOSPITAL_COMMUNITY): Payer: PPO

## 2021-06-17 DIAGNOSIS — E790 Hyperuricemia without signs of inflammatory arthritis and tophaceous disease: Secondary | ICD-10-CM

## 2021-06-17 DIAGNOSIS — I2602 Saddle embolus of pulmonary artery with acute cor pulmonale: Secondary | ICD-10-CM

## 2021-06-17 DIAGNOSIS — R0600 Dyspnea, unspecified: Secondary | ICD-10-CM

## 2021-06-17 DIAGNOSIS — I2782 Chronic pulmonary embolism: Secondary | ICD-10-CM

## 2021-06-17 LAB — BRAIN NATRIURETIC PEPTIDE: B Natriuretic Peptide: 63 pg/mL (ref 0.0–100.0)

## 2021-06-17 LAB — COMPREHENSIVE METABOLIC PANEL
ALT: 90 U/L — ABNORMAL HIGH (ref 0–44)
AST: 123 U/L — ABNORMAL HIGH (ref 15–41)
Albumin: 3.1 g/dL — ABNORMAL LOW (ref 3.5–5.0)
Alkaline Phosphatase: 69 U/L (ref 38–126)
Anion gap: 14 (ref 5–15)
BUN: 118 mg/dL — ABNORMAL HIGH (ref 8–23)
CO2: 24 mmol/L (ref 22–32)
Calcium: 8.7 mg/dL — ABNORMAL LOW (ref 8.9–10.3)
Chloride: 97 mmol/L — ABNORMAL LOW (ref 98–111)
Creatinine, Ser: 5.36 mg/dL — ABNORMAL HIGH (ref 0.44–1.00)
GFR, Estimated: 8 mL/min — ABNORMAL LOW (ref >60)
Glucose, Bld: 111 mg/dL — ABNORMAL HIGH (ref 70–99)
Potassium: 3 mmol/L — ABNORMAL LOW (ref 3.5–5.1)
Sodium: 135 mmol/L (ref 135–145)
Total Bilirubin: 0.8 mg/dL (ref 0.3–1.2)
Total Protein: 5.6 g/dL — ABNORMAL LOW (ref 6.5–8.1)

## 2021-06-17 LAB — C4 COMPLEMENT: Complement C4, Body Fluid: 28 mg/dL (ref 12–38)

## 2021-06-17 LAB — IGG, IGA, IGM
IgA: 110 mg/dL (ref 64–422)
IgG (Immunoglobin G), Serum: 530 mg/dL — ABNORMAL LOW (ref 586–1602)
IgM (Immunoglobulin M), Srm: 74 mg/dL (ref 26–217)

## 2021-06-17 LAB — LIPID PANEL
Cholesterol: 100 mg/dL (ref 0–200)
HDL: 24 mg/dL — ABNORMAL LOW (ref >40)
LDL Cholesterol: 42 mg/dL (ref 0–99)
Total CHOL/HDL Ratio: 4.2 RATIO
Triglycerides: 169 mg/dL — ABNORMAL HIGH (ref <150)
VLDL: 34 mg/dL (ref 0–40)

## 2021-06-17 LAB — GLOMERULAR BASEMENT MEMBRANE ANTIBODIES: GBM Ab: <0.2 units (ref 0.0–0.9)

## 2021-06-17 LAB — CBC
HCT: 26.6 % — ABNORMAL LOW (ref 36.0–46.0)
Hemoglobin: 8.8 g/dL — ABNORMAL LOW (ref 12.0–15.0)
MCH: 31.7 pg (ref 26.0–34.0)
MCHC: 33.1 g/dL (ref 30.0–36.0)
MCV: 95.7 fL (ref 80.0–100.0)
Platelets: 169 10*3/uL (ref 150–400)
RBC: 2.78 MIL/uL — ABNORMAL LOW (ref 3.87–5.11)
RDW: 16.6 % — ABNORMAL HIGH (ref 11.5–15.5)
WBC: 6.7 10*3/uL (ref 4.0–10.5)
nRBC: 0 % (ref 0.0–0.2)

## 2021-06-17 LAB — ALPHA-1-ANTITRYPSIN: A-1 Antitrypsin, Ser: 149 mg/dL (ref 101–187)

## 2021-06-17 LAB — PHOSPHORUS: Phosphorus: 7.8 mg/dL — ABNORMAL HIGH (ref 2.5–4.6)

## 2021-06-17 LAB — ANTISTREPTOLYSIN O TITER: ASO: 39 IU/mL (ref 0.0–200.0)

## 2021-06-17 LAB — ANTI-DNA ANTIBODY, DOUBLE-STRANDED: ds DNA Ab: 1 IU/mL (ref 0–9)

## 2021-06-17 LAB — GLUCOSE, CAPILLARY
Glucose-Capillary: 108 mg/dL — ABNORMAL HIGH (ref 70–99)
Glucose-Capillary: 104 mg/dL — ABNORMAL HIGH (ref 70–99)
Glucose-Capillary: 130 mg/dL — ABNORMAL HIGH (ref 70–99)
Glucose-Capillary: 110 mg/dL — ABNORMAL HIGH (ref 70–99)
Glucose-Capillary: 114 mg/dL — ABNORMAL HIGH (ref 70–99)

## 2021-06-17 LAB — MAGNESIUM: Magnesium: 1.7 mg/dL (ref 1.7–2.4)

## 2021-06-17 LAB — C3 COMPLEMENT: C3 Complement: 121 mg/dL (ref 82–167)

## 2021-06-17 LAB — ANTINUCLEAR ANTIBODIES, IFA: ANA Ab, IFA: NEGATIVE

## 2021-06-17 MED ORDER — LORAZEPAM 2 MG/ML IJ SOLN
1.0000 mg | Freq: Once | INTRAMUSCULAR | Status: AC
  Administered 2021-06-17: 1 mg via INTRAVENOUS
  Filled 2021-06-17: qty 1

## 2021-06-17 MED ORDER — POTASSIUM CHLORIDE CRYS ER 20 MEQ PO TBCR
40.0000 meq | EXTENDED_RELEASE_TABLET | Freq: Once | ORAL | Status: AC
  Administered 2021-06-17: 40 meq via ORAL
  Filled 2021-06-17: qty 2

## 2021-06-17 MED ORDER — MAGNESIUM SULFATE 2 GM/50ML IV SOLN
2.0000 g | Freq: Once | INTRAVENOUS | Status: AC
Start: 2021-06-17 — End: 2021-06-17
  Administered 2021-06-17: 2 g via INTRAVENOUS
  Filled 2021-06-17: qty 50

## 2021-06-17 NOTE — Progress Notes (Signed)
Patient ID: Lauren Hamilton, female   DOB: 07-Jan-1946, 76 y.o.   MRN: [id number] S: Feeling much better today.  Edema has improved as well as appetite. O:BP 137/61    Pulse 80    Temp 98.3 F (36.8 C)    Resp 19    Ht 5\' 6"  (1.676 m)    Wt 105.2 kg    SpO2 94%    BMI 37.45 kg/m   Intake/Output Summary (Last 24 hours) at 06/17/2021 0846 Last data filed at 06/17/2021 6789 Gross per 24 hour  Intake 2282.04 ml  Output 100 ml  Net 2182.04 ml   Intake/Output: I/O last 3 completed shifts: In: 2048.4 [P.O.:720; I.V.:1328.4] Out: 100 [Urine:100]  Intake/Output this shift:  Total I/O In: 233.6 [I.V.:233.6] Out: -  Weight change:  Gen:NAD CVS: RRR Resp:CTA Abd: +BS, soft, NT/ND Ext: no edema  Recent Labs  Lab 06/15/21 1306 06/15/21 2056 06/17/21 0546  NA 135 133* 135  K 3.4* 2.9* 3.0*  CL 95* 93* 97*  CO2 22 20* 24  GLUCOSE 128* 166* 111*  BUN 121* 129* 118*  CREATININE 5.87* 5.83* 5.36*  ALBUMIN 4.0 3.9 3.1*  CALCIUM 9.0 8.8* 8.7*  PHOS  --   --  7.8*  AST 178* 177* 123*  ALT 119* 117* 90*   Liver Function Tests: Recent Labs  Lab 06/15/21 1306 06/15/21 2056 06/17/21 0546  AST 178* 177* 123*  ALT 119* 117* 90*  ALKPHOS 90 92 69  BILITOT 0.7 0.9 0.8  PROT 7.1 7.0 5.6*  ALBUMIN 4.0 3.9 3.1*   No results for input(s): LIPASE, AMYLASE in the last 168 hours. No results for input(s): AMMONIA in the last 168 hours. CBC: Recent Labs  Lab 06/15/21 2056 06/17/21 0546  WBC 9.3 6.7  HGB 10.6* 8.8*  HCT 31.2* 26.6*  MCV 96.3 95.7  PLT 198 169   Cardiac Enzymes: No results for input(s): CKTOTAL, CKMB, CKMBINDEX, TROPONINI in the last 168 hours. CBG: Recent Labs  Lab 06/16/21 1201 06/16/21 1703 06/16/21 2148 06/17/21 0415 06/17/21 0707  GLUCAP 190* 119* 125* 108* 104*    Iron Studies:  Recent Labs    06/16/21 0320  IRON 58  TIBC 445  FERRITIN 177   Studies/Results: NM Pulmonary Perfusion  Result Date: 06/16/2021 CLINICAL DATA:  Coursing shortness of  breath for 10 days. EXAM: NUCLEAR MEDICINE PERFUSION LUNG SCAN TECHNIQUE: Perfusion images were obtained in multiple projections after intravenous injection of radiopharmaceutical. Ventilation scans intentionally deferred if perfusion scan and chest x-ray adequate for interpretation during COVID 19 epidemic. RADIOPHARMACEUTICALS:  4.4 mCi Tc-63m MAA IV COMPARISON:  Chest x-ray performed same day. FINDINGS: No peripheral wedge-shaped perfusion defect identified in either lung. IMPRESSION: Negative for pulmonary embolus. Electronically Signed   By: Misty Stanley M.D.   On: 06/16/2021 07:46   US RENAL  Result Date: 06/16/2021 CLINICAL DATA:  Renal dysfunction EXAM: RENAL / URINARY TRACT ULTRASOUND COMPLETE COMPARISON:  01/07/2019 FINDINGS: Right Kidney: Renal measurements: 10.3 x 5 x 5.8 cm = volume: 154.7 mL. Echogenicity within normal limits. No mass or hydronephrosis visualized. Left Kidney: Renal measurements: 10.5 x 5.8 x 5.3 cm = volume: 170 mL. Echogenicity within normal limits. No mass or hydronephrosis visualized. Bladder: Appears normal for degree of bladder distention. Other: There is possible 1.6 cm splenule adjacent to the spleen. IMPRESSION: There is no hydronephrosis. Electronically Signed   By: Elmer Picker M.D.   On: 06/16/2021 11:51   DG Chest Port 1 View  Result Date: 06/16/2021  CLINICAL DATA:  Dyspnea EXAM: PORTABLE CHEST 1 VIEW COMPARISON:  10/31/2019 FINDINGS: Lungs are well expanded, symmetric, and clear. No pneumothorax or pleural effusion. Cardiac size within normal limits. Pulmonary vascularity is normal. Osseous structures are age-appropriate. No acute bone abnormality. Surgical clips noted at the left neck base. IMPRESSION: No active disease. Electronically Signed   By: Fidela Salisbury M.D.   On: 06/16/2021 03:05    allopurinol  100 mg Oral QHS   carvedilol  6.25 mg Oral BID WC   DULoxetine  30 mg Oral Daily   fluconazole  75 mg Oral Daily   insulin aspart  0-9 Units  Subcutaneous TID WC   loratadine  10 mg Oral QPM   pantoprazole  40 mg Oral QAC breakfast    BMET    Component Value Date/Time   NA 135 06/17/2021 0546   K 3.0 (L) 06/17/2021 0546   CL 97 (L) 06/17/2021 0546   CO2 24 06/17/2021 0546   GLUCOSE 111 (H) 06/17/2021 0546   BUN 118 (H) 06/17/2021 0546   CREATININE 5.36 (H) 06/17/2021 0546   CREATININE 1.27 (H) 03/11/2020 0930   CALCIUM 8.7 (L) 06/17/2021 0546   GFRNONAA 8 (L) 06/17/2021 0546   GFRNONAA 42 (L) 03/11/2020 0930   GFRAA 48 (L) 03/11/2020 0930   CBC    Component Value Date/Time   WBC 6.7 06/17/2021 0546   RBC 2.78 (L) 06/17/2021 0546   HGB 8.8 (L) 06/17/2021 0546   HCT 26.6 (L) 06/17/2021 0546   PLT 169 06/17/2021 0546   MCV 95.7 06/17/2021 0546   MCH 31.7 06/17/2021 0546   MCHC 33.1 06/17/2021 0546   RDW 16.6 (H) 06/17/2021 0546   LYMPHSABS 2.2 10/31/2019 1250   MONOABS 0.5 10/31/2019 1250   EOSABS 0.5 10/31/2019 1250   BASOSABS 0.1 10/31/2019 1250    Assessment/Plan:  Subacute-AKI/CKD stage IIIb - Pt with history of heavy NSAID use in the past as well as obesity, DM, and HTN.  Scr had been ranging 1.3-1.5 until 02/05/21 when it rose to 1.91 and has had a more recent baseline of 1.7-1.9 until 05/13/21 (after starting valsartan) rose to 3.12.  UA with +blood, prot, and leukocytes.  Also with a drop in Hgb from 13.7 to 10.7 and heme + stools.  BUN disproportionally elevated.  DDx includes ischemic ATN in setting of ABLA and relative hypotension, acute GN, bilateral RAS with use of ARB. BUN/Cr improving and continue with IVF's for now. Serologies for acute GN pending, normal complements Renal US without obstruction Is completely asymptomatic and no indication for dialysis at this time. She may require a renal biopsy if no improvement with IVF's.  Renal dose medications, avoid nephrotoxic agents such as NSAIDs, IV contrast, and phosphate containing bowel preps. SOB and DOE - V/Q scan negative for PE.  BNP mildly  elevated at 147--> 63.  Possibly due to underlying lung disease as she is not overtly volume overloaded and CXR is clear. Anemia - with heme + stools.  Likely GIB from recent prednisone taper.  Follow H/H and transfuse as needed.  Abnormal LFT's -  hepatitis panel negative.  [place] of liver pending.  GI consulted  Hypokalemia - replete and follow. Hyponatremia - possibly related to AKI.  Hypochloremic. Improved with IVF's.  Donetta Potts, MD [employer] [phone]

## 2021-06-17 NOTE — Progress Notes (Addendum)
Subjective:  Feeling much better today. No specific symptoms. No melena, brbpr.   Objective: Vital signs in last 24 hours: Temp:  [98 F (36.7 C)-98.3 F (36.8 C)] 98 F (36.7 C) (01/05 1333) Pulse Rate:  [80-82] 82 (01/05 1333) Resp:  [18-19] 18 (01/05 1333) BP: (103-137)/(48-61) 103/48 (01/05 1333) SpO2:  [94 %-96 %] 95 % (01/05 1333) Last BM Date: 06/16/21 General:   Alert,  Well-developed, well-nourished, pleasant and cooperative in NAD Head:  Normocephalic and atraumatic. Eyes:  Sclera clear, no icterus.  Chest: CTA bilaterally without rales, rhonchi, crackles.    Heart:  Regular rate and rhythm; no murmurs, clicks, rubs,  or gallops. Abdomen:  Soft, nontender and nondistended. No masses, hepatosplenomegaly or hernias noted. Normal bowel sounds, without guarding, and without rebound.   Extremities:  Without clubbing, deformity or edema. Neurologic:  Alert and  oriented x4;  grossly normal neurologically. Skin:  Intact without significant lesions or rashes. Psych:  Alert and cooperative. Normal mood and affect.  Intake/Output from previous day: 01/04 0701 - 01/05 0700 In: 2048.4 [P.O.:720; I.V.:1328.4] Out: 100 [Urine:100] Intake/Output this shift: Total I/O In: 233.6 [I.V.:233.6] Out: 650 [Urine:650]  Lab Results: CBC Recent Labs    06/15/21 2056 06/17/21 0546  WBC 9.3 6.7  HGB 10.6* 8.8*  HCT 31.2* 26.6*  MCV 96.3 95.7  PLT 198 169   BMET Recent Labs    06/15/21 1306 06/15/21 2056 06/17/21 0546  NA 135 133* 135  K 3.4* 2.9* 3.0*  CL 95* 93* 97*  CO2 22 20* 24  GLUCOSE 128* 166* 111*  BUN 121* 129* 118*  CREATININE 5.87* 5.83* 5.36*  CALCIUM 9.0 8.8* 8.7*   LFTs Recent Labs    06/15/21 1306 06/15/21 2056 06/17/21 0546  BILITOT 0.7 0.9 0.8  ALKPHOS 90 92 69  AST 178* 177* 123*  ALT 119* 117* 90*  PROT 7.1 7.0 5.6*  ALBUMIN 4.0 3.9 3.1*   No results for input(s): LIPASE in the last 72 hours. PT/INR No results for input(s): LABPROT, INR in  the last 72 hours.    Imaging Studies: NM Pulmonary Perfusion  Result Date: 06/16/2021 CLINICAL DATA:  Coursing shortness of breath for 10 days. EXAM: NUCLEAR MEDICINE PERFUSION LUNG SCAN TECHNIQUE: Perfusion images were obtained in multiple projections after intravenous injection of radiopharmaceutical. Ventilation scans intentionally deferred if perfusion scan and chest x-ray adequate for interpretation during COVID 19 epidemic. RADIOPHARMACEUTICALS:  4.4 mCi Tc-1m MAA IV COMPARISON:  Chest x-ray performed same day. FINDINGS: No peripheral wedge-shaped perfusion defect identified in either lung. IMPRESSION: Negative for pulmonary embolus. Electronically Signed   By: Misty Stanley M.D.   On: 06/16/2021 07:46   US Abdomen Complete  Result Date: 06/17/2021 CLINICAL DATA:  Abnormal LFTs EXAM: ABDOMEN ULTRASOUND COMPLETE COMPARISON:  No prior abdominal ultrasound, correlation is made with 06/16/2021 renal ultrasound. FINDINGS: Gallbladder: Surgically absent Common bile duct: Diameter: 6 mm Liver: No focal lesion identified. Slightly lobular liver contour. Increased parenchymal echogenicity. Portal vein is patent on color Doppler imaging with normal direction of blood flow towards the liver. IVC: No abnormality visualized. Pancreas: The tail is obscured by overlying bowel gas. Imaged portions are unremarkable. Spleen: Size and appearance within normal limits. Right Kidney: Length: 10.5 cm. Echogenicity within normal limits. No mass or hydronephrosis visualized. Left Kidney: Length: 10.9 cm. Echogenicity within normal limits. No mass or hydronephrosis visualized. Abdominal aorta: No aneurysm visualized. Other findings: None. IMPRESSION: Slightly lobular liver contour, suggestive of cirrhosis, with increased parenchymal echogenicity, suggestive of  hepatic steatosis. Otherwise normal ultrasound of the abdomen. Electronically Signed   By: Merilyn Baba M.D.   On: 06/17/2021 11:27   US RENAL  Result Date:  06/16/2021 CLINICAL DATA:  Renal dysfunction EXAM: RENAL / URINARY TRACT ULTRASOUND COMPLETE COMPARISON:  01/07/2019 FINDINGS: Right Kidney: Renal measurements: 10.3 x 5 x 5.8 cm = volume: 154.7 mL. Echogenicity within normal limits. No mass or hydronephrosis visualized. Left Kidney: Renal measurements: 10.5 x 5.8 x 5.3 cm = volume: 170 mL. Echogenicity within normal limits. No mass or hydronephrosis visualized. Bladder: Appears normal for degree of bladder distention. Other: There is possible 1.6 cm splenule adjacent to the spleen. IMPRESSION: There is no hydronephrosis. Electronically Signed   By: Elmer Picker M.D.   On: 06/16/2021 11:51   DG Chest Port 1 View  Result Date: 06/16/2021 CLINICAL DATA:  Dyspnea EXAM: PORTABLE CHEST 1 VIEW COMPARISON:  10/31/2019 FINDINGS: Lungs are well expanded, symmetric, and clear. No pneumothorax or pleural effusion. Cardiac size within normal limits. Pulmonary vascularity is normal. Osseous structures are age-appropriate. No acute bone abnormality. Surgical clips noted at the left neck base. IMPRESSION: No active disease. Electronically Signed   By: Fidela Salisbury M.D.   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  She was found to have elevated transaminases and mild anemia along with positive Hemoccult.  GI consulted for further evaluation.  Anemia: On presentation her hemoglobin was 10.6.  (Outpatient labs through her nephrologist, November 2022, hemoglobin 13.7).  MCV 96.3.  Iron 58, TIBC 445, iron saturations 13%, ferritin 177.  B12 440, folate 22.  July 2022 her iron saturations were 13%, TIBC 462, serum iron 58, ferritin 21.  Today her  hemoglobin is 8.8.  She reports that she is supposed to be on oral iron but does not take it every day due to constipation, may take once a month.  Denies melena or rectal bleeding, reported to be heme positive this admission.  Some history of decreased appetite and early satiety.  Last colonoscopy December 2019, multiple tubular adenomas removed.  She had nonbleeding external and internal hemorrhoids.  Recent prednisone taper.  Xarelto currently on hold.  VQ scan this admission negative for acute PE.  Elevated LFTs: Recent antibiotics for sinusitis before Christmas, unsure of what name.  Takes slippery elm for constipation but denies any other supplements.  No alcohol use.  Denies abdominal pain. On presentation her total bilirubin was 0.7, alkaline phosphatase 90, AST 178, ALT 119.  LFTs in May 2021 were normal.  ANA negative, IgG/IgA/IgM unremarkable, alpha-1 antitrypsin level normal at 149, hepatitis B surface antigen nonreactive, hepatitis C antibody nonreactive, hepatitis A IgM nonreactive, hepatitis B core IgM nonreactive, mitochondrial antibodies pending, anti-smooth muscle antibody pending.  Today her total bilirubin and alkaline phosphatase remain normal.  Her AST is down to 123, ALT down to 90.  Abdominal ultrasound completed today showing slightly lobular liver contour, suggestive of cirrhosis, with increased parenchymal echogenicity, suggestive of hepatic steatosis.  CBD 6 mm.  Gallbladder surgically absent.  No evidence of portal hypertension.  Platelet count normal.  Unlikely has esophageal varices.  Acute on chronic renal failure: Labs from May 27, 2021, creatinine 3.11.  Labs from April 21, 2021, creatinine 1.81. September  2021 her creatinine was 1.27.  On presentation her creatinine was 5.87, down to 5.36 today.  Plan: CBC, LFTs tomorrow. Monitor for overt GI bleeding. Follow-up pending labs. Xarelto temporarily on hold. Continue pantoprazole 40 mg once daily, decreased from twice  daily given acute worsening renal failure. Consider upper endoscopy prior to discharge (was significant downward trend of Creatinine) for history of early satiety/loss of appetite, significant decline in hemoglobin, heme positive stool.  Ultrasound was suggestion of cirrhosis based on slightly nodular liver contour.  Spleen normal in size.  Platelet count normal.  She unlikely has esophageal varices. No rush to complete colonoscopy given last 1 in December 2019, acute decline in renal function, no evidence of overt GI bleeding.  Will not exclude outpatient colonoscopy based on EGD findings. NPO after midnight and reasses in AM.  Laureen Ochs. Bernarda Caffey [facility] [phone] 1/5/20232:43 PM    LOS: 1 day

## 2021-06-17 NOTE — Plan of Care (Signed)
  Problem: Health Behavior/Discharge Planning: Goal: Ability to manage health-related needs will improve Outcome: Progressing   Problem: Clinical Measurements: Goal: Ability to maintain clinical measurements within normal limits will improve Outcome: Progressing   

## 2021-06-17 NOTE — Progress Notes (Signed)
PROGRESS NOTE   Lauren Hamilton  KGY:[id number] DOB: Feb 15, 1946 DOA: 06/16/2021 PCP: Monico Blitz, MD   Chief Complaint  Patient presents with   Dehydration   Level of care: Telemetry  Brief Admission History:  76 y.o. female with medical history significant for stage IIIb CKD followed by Dr. 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  Patient had been complaining of progressive symptoms of dyspnea over the past month.  There has been no chest pain symptoms.  No fever or chills.  No cough.  Patient reports that she has been having hemorrhoids and had a colonoscopy in 2019 with polypectomy.  Patient reports urinary urgency but reports that she is having small amounts of urine output.  She has been followed by her nephrologist closely.  Assessment & Plan:   Principal Problem:   Acute-on-chronic kidney injury ([place]) Active Problems:   Pulmonary embolism (HCC)   Hypertension   Arthritis   Chronic rhinitis   Long term current use of anticoagulant therapy   Diabetes mellitus without complication (HCC)   Normochromic normocytic anemia   Stage 3b chronic kidney disease (CKD) (HCC)   Guaiac positive stools   Hx of colonic polyps   Elevated transaminase level   Hypokalemia   Gout   Hyperuricemia   Dyspnea   AKI on CKD stage IIIb -Appreciate nephrology consultation with Dr. Marval Regal -Gentle IV fluid hydration ordered -Renally dose medications as appropriate avoid nephrotoxic agent -Follow renal function panel -Renal ultrasound ruled out obstruction   Dyspnea -Acute pulmonary embolism has been ruled out on VQ scan -No signs or symptoms of heart failure -Supplemental oxygen as needed   Anemia in CKD -Hemoccult  positive stools concerning given patient is on Xarelto -I have asked for GI consultation for assistance with management  -temporarily holding Xarelto per GI  -Has a history of recurrent venous thromboembolism with stopping Xarelto -Follow CBC closely -Anemia panel is reassuring   Elevated LFTs -hepatitis panel negative  -GI consultation ordered liver US with findings of steatosis    Hypokalemia -additional K and Mg ordered 1/5 -Recheck in a.m.   Metabolic acidosis -Secondary to AKI -resolved with IV fluid.   Type 2 diabetes mellitus with renal complications, uncontrolled -As evidenced by hemoglobin A1c of 8.1% -Continue SSI coverage and CBG monitoring -Add basal and prandial coverage for better glycemic control -lipid panel well controlled. CBG (last 3)  Recent Labs    06/17/21 0415 06/17/21 0707 06/17/21 1130  GLUCAP 108* 104* 130*    UTI-fungal -Fluconazole daily x3 days -Glycemic control orders   Gout -Continue daily allopurinol to treat hyperuricemia  DVT prophylaxis: SCDs Code Status: full  Family Communication: daughter at bedside  Disposition: anticipate DC  home  Status is: Inpatient  Remains inpatient appropriate because: IV fluids required for therapy   Consultants:  Nephrology GI   Procedures:  Renal US : no hydronephrosis Liver US:  hepatic steatosis   Antimicrobials:  N/a   Subjective: Pt reports that she is feeling better today and less edema in legs and eating better.  No SOB. No CP.   Objective: Vitals:   06/16/21 1316 06/16/21 1720 06/16/21 2100 06/17/21 0605  BP: 106/63 (!) 124/52 (!) 114/55 137/61  Pulse: 79 81 80 80  Resp: 20 18 19 19   Temp: 97.8 F (36.6  C) 98 F (36.7 C) 98.1 F (36.7 C) 98.3 F (36.8 C)  TempSrc: Oral Oral Oral   SpO2: 99% 96% 96% 94%  Weight:      Height:        Intake/Output Summary (Last 24 hours) at 06/17/2021 1243 Last data filed at 06/17/2021 1222 Gross per 24 hour  Intake 2042.04 ml  Output  750 ml  Net 1292.04 ml   Filed Weights   06/15/21 1925  Weight: 105.2 kg   Examination:  General exam: Appears calm and comfortable  Respiratory system: Clear to auscultation. Respiratory effort normal. Cardiovascular system: normal S1 & S2 heard. No JVD, murmurs, rubs, gallops or clicks. No pedal edema. Gastrointestinal system: Abdomen is nondistended, soft and nontender. No organomegaly or masses felt. Normal bowel sounds heard. Central nervous system: Alert and oriented. No focal neurological deficits. Extremities: Symmetric 5 x 5 power. Skin: No rashes, lesions or ulcers Psychiatry: Judgement and insight appear normal. Mood & affect appropriate.   Data Reviewed: I have personally reviewed following labs and imaging studies  CBC: Recent Labs  Lab 06/15/21 2056 06/17/21 0546  WBC 9.3 6.7  HGB 10.6* 8.8*  HCT 31.2* 26.6*  MCV 96.3 95.7  PLT 198 809    Basic Metabolic Panel: Recent Labs  Lab 06/15/21 1306 06/15/21 2056 06/17/21 0546  NA 135 133* 135  K 3.4* 2.9* 3.0*  CL 95* 93* 97*  CO2 22 20* 24  GLUCOSE 128* 166* 111*  BUN 121* 129* 118*  CREATININE 5.87* 5.83* 5.36*  CALCIUM 9.0 8.8* 8.7*  MG  --  2.1 1.7  PHOS  --   --  7.8*    GFR: Estimated Creatinine Clearance: 11.3 mL/min (A) (by C-G formula based on SCr of 5.36 mg/dL (H)).  Liver Function Tests: Recent Labs  Lab 06/15/21 1306 06/15/21 2056 06/17/21 0546  AST 178* 177* 123*  ALT 119* 117* 90*  ALKPHOS 90 92 69  BILITOT 0.7 0.9 0.8  PROT 7.1 7.0 5.6*  ALBUMIN 4.0 3.9 3.1*    CBG: Recent Labs  Lab 06/16/21 1703 06/16/21 2148 06/17/21 0415 06/17/21 0707 06/17/21 1130  GLUCAP 119* 125* 108* 104* 130*    Recent Results (from the past 240 hour(s))  Resp Panel by RT-PCR (Flu A&B, Covid) Nasopharyngeal Swab     Status: None   Collection Time: 06/16/21  3:30 AM   Specimen: Nasopharyngeal Swab; Nasopharyngeal(NP) swabs in vial transport medium  Result Value Ref Range Status   SARS  Coronavirus 2 by RT PCR NEGATIVE NEGATIVE Final    Comment: (NOTE) SARS-CoV-2 target nucleic acids are NOT DETECTED.  The SARS-CoV-2 RNA is generally detectable in upper respiratory specimens during the acute phase of infection. The lowest concentration of SARS-CoV-2 viral copies this assay can detect is 138 copies/mL. A negative result does not preclude SARS-Cov-2 infection and should not be used as the sole basis for treatment or other patient management decisions. A negative result may occur with  improper specimen collection/handling, submission of specimen other than nasopharyngeal swab, presence of viral mutation(s) within the areas targeted by this assay, and inadequate number of viral copies(<138 copies/mL). A negative result must be combined with clinical observations, patient history, and epidemiological information. The expected result is Negative.  Fact Sheet for Patients:  EntrepreneurPulse.com.au  Fact Sheet for Healthcare Providers:  IncredibleEmployment.be  This test is no t yet approved or cleared by the [place] FDA and  has been authorized for detection and/or diagnosis of SARS-CoV-2 by FDA under  an Emergency Use Authorization (EUA). This EUA will remain  in effect (meaning this test can be used) for the duration of the COVID-19 declaration under Section 564(b)(1) of the Act, 21 U.S.C.section 360bbb-3(b)(1), unless the authorization is terminated  or revoked sooner.       Influenza A by PCR NEGATIVE NEGATIVE Final   Influenza B by PCR NEGATIVE NEGATIVE Final    Comment: (NOTE) The Xpert Xpress SARS-CoV-2/FLU/RSV plus assay is intended as an aid in the diagnosis of influenza from Nasopharyngeal swab specimens and should not be used as a sole basis for treatment. Nasal washings and aspirates are unacceptable for Xpert Xpress SARS-CoV-2/FLU/RSV testing.  Fact Sheet for  Patients: EntrepreneurPulse.com.au  Fact Sheet for Healthcare Providers: IncredibleEmployment.be  This test is not yet approved or cleared by the [place] FDA and has been authorized for detection and/or diagnosis of SARS-CoV-2 by FDA under an Emergency Use Authorization (EUA). This EUA will remain in effect (meaning this test can be used) for the duration of the COVID-19 declaration under Section 564(b)(1) of the Act, 21 U.S.C. section 360bbb-3(b)(1), unless the authorization is terminated or revoked.  Performed at [facility], [address] [place], [place] [address]      Radiology Studies: NM Pulmonary Perfusion  Result Date: 06/16/2021 CLINICAL DATA:  Coursing shortness of breath for 10 days. EXAM: NUCLEAR MEDICINE PERFUSION LUNG SCAN TECHNIQUE: Perfusion images were obtained in multiple projections after intravenous injection of radiopharmaceutical. Ventilation scans intentionally deferred if perfusion scan and chest x-ray adequate for interpretation during COVID 19 epidemic. RADIOPHARMACEUTICALS:  4.4 mCi Tc-60m MAA IV COMPARISON:  Chest x-ray performed same day. FINDINGS: No peripheral wedge-shaped perfusion defect identified in either lung. IMPRESSION: Negative for pulmonary embolus. Electronically Signed   By: Misty Stanley M.D.   On: 06/16/2021 07:46   US Abdomen Complete  Result Date: 06/17/2021 CLINICAL DATA:  Abnormal LFTs EXAM: ABDOMEN ULTRASOUND COMPLETE COMPARISON:  No prior abdominal ultrasound, correlation is made with 06/16/2021 renal ultrasound. FINDINGS: Gallbladder: Surgically absent Common bile duct: Diameter: 6 mm Liver: No focal lesion identified. Slightly lobular liver contour. Increased parenchymal echogenicity. Portal vein is patent on color Doppler imaging with normal direction of blood flow towards the liver. IVC: No abnormality visualized. Pancreas: The tail is obscured by overlying bowel gas. Imaged portions are  unremarkable. Spleen: Size and appearance within normal limits. Right Kidney: Length: 10.5 cm. Echogenicity within normal limits. No mass or hydronephrosis visualized. Left Kidney: Length: 10.9 cm. Echogenicity within normal limits. No mass or hydronephrosis visualized. Abdominal aorta: No aneurysm visualized. Other findings: None. IMPRESSION: Slightly lobular liver contour, suggestive of cirrhosis, with increased parenchymal echogenicity, suggestive of hepatic steatosis. Otherwise normal ultrasound of the abdomen. Electronically Signed   By: Merilyn Baba M.D.   On: 06/17/2021 11:27   US RENAL  Result Date: 06/16/2021 CLINICAL DATA:  Renal dysfunction EXAM: RENAL / URINARY TRACT ULTRASOUND COMPLETE COMPARISON:  01/07/2019 FINDINGS: Right Kidney: Renal measurements: 10.3 x 5 x 5.8 cm = volume: 154.7 mL. Echogenicity within normal limits. No mass or hydronephrosis visualized. Left Kidney: Renal measurements: 10.5 x 5.8 x 5.3 cm = volume: 170 mL. Echogenicity within normal limits. No mass or hydronephrosis visualized. Bladder: Appears normal for degree of bladder distention. Other: There is possible 1.6 cm splenule adjacent to the spleen. IMPRESSION: There is no hydronephrosis. Electronically Signed   By: Elmer Picker M.D.   On: 06/16/2021 11:51   DG Chest Port 1 View  Result Date: 06/16/2021 CLINICAL DATA:  Dyspnea EXAM:  PORTABLE CHEST 1 VIEW COMPARISON:  10/31/2019 FINDINGS: Lungs are well expanded, symmetric, and clear. No pneumothorax or pleural effusion. Cardiac size within normal limits. Pulmonary vascularity is normal. Osseous structures are age-appropriate. No acute bone abnormality. Surgical clips noted at the left neck base. IMPRESSION: No active disease. Electronically Signed   By: Fidela Salisbury M.D.   On: 06/16/2021 03:05    Scheduled Meds:  allopurinol  100 mg Oral QHS   carvedilol  6.25 mg Oral BID WC   DULoxetine  30 mg Oral Daily   fluconazole  75 mg Oral Daily   insulin aspart   0-9 Units Subcutaneous TID WC   loratadine  10 mg Oral QPM   pantoprazole  40 mg Oral QAC breakfast   Continuous Infusions:  lactated ringers Stopped (06/17/21 0824)    LOS: 1 day   Time spent: 37 mins   Loree Shehata Wynetta Emery, MD How to contact the [facility] Attending or Consulting provider [place] or covering provider during after hours [place], for this patient?  Check the care team in [facility] and look for a) attending/consulting [facility] provider listed and b) the [facility] team listed Log into [email or web address] and use [place]'s universal password to access. If you do not have the password, please contact the hospital operator. Locate the [facility] provider you are looking for under [facility] and page to a number that you can be directly reached. If you still have difficulty reaching the provider, please page the [facility] (Director on Call) for the Hospitalists listed on amion for assistance.  06/17/2021, 12:43 PM

## 2021-06-18 ENCOUNTER — Encounter (HOSPITAL_COMMUNITY): Payer: Self-pay | Admitting: Family Medicine

## 2021-06-18 ENCOUNTER — Inpatient Hospital Stay (HOSPITAL_COMMUNITY): Payer: PPO | Admitting: Anesthesiology

## 2021-06-18 ENCOUNTER — Encounter (HOSPITAL_COMMUNITY)
Admission: EM | Disposition: A | Discharge status: Home / Self Care | Payer: Self-pay | Source: Home / Self Care | Attending: Family Medicine

## 2021-06-18 DIAGNOSIS — R1013 Epigastric pain: Secondary | ICD-10-CM

## 2021-06-18 DIAGNOSIS — K449 Diaphragmatic hernia without obstruction or gangrene: Secondary | ICD-10-CM

## 2021-06-18 DIAGNOSIS — K317 Polyp of stomach and duodenum: Secondary | ICD-10-CM

## 2021-06-18 DIAGNOSIS — D509 Iron deficiency anemia, unspecified: Secondary | ICD-10-CM

## 2021-06-18 HISTORY — PX: ESOPHAGOGASTRODUODENOSCOPY (EGD) WITH PROPOFOL: SHX5813

## 2021-06-18 HISTORY — PX: POLYPECTOMY: SHX5525

## 2021-06-18 LAB — GLUCOSE, CAPILLARY
Glucose-Capillary: 87 mg/dL (ref 70–99)
Glucose-Capillary: 105 mg/dL — ABNORMAL HIGH (ref 70–99)
Glucose-Capillary: 103 mg/dL — ABNORMAL HIGH (ref 70–99)
Glucose-Capillary: 126 mg/dL — ABNORMAL HIGH (ref 70–99)
Glucose-Capillary: 127 mg/dL — ABNORMAL HIGH (ref 70–99)

## 2021-06-18 LAB — COMPREHENSIVE METABOLIC PANEL
ALT: 85 U/L — ABNORMAL HIGH (ref 0–44)
AST: 102 U/L — ABNORMAL HIGH (ref 15–41)
Albumin: 3.3 g/dL — ABNORMAL LOW (ref 3.5–5.0)
Alkaline Phosphatase: 70 U/L (ref 38–126)
Anion gap: 14 (ref 5–15)
BUN: 98 mg/dL — ABNORMAL HIGH (ref 8–23)
CO2: 26 mmol/L (ref 22–32)
Calcium: 8.9 mg/dL (ref 8.9–10.3)
Chloride: 96 mmol/L — ABNORMAL LOW (ref 98–111)
Creatinine, Ser: 5.08 mg/dL — ABNORMAL HIGH (ref 0.44–1.00)
GFR, Estimated: 8 mL/min — ABNORMAL LOW (ref >60)
Glucose, Bld: 97 mg/dL (ref 70–99)
Potassium: 3.4 mmol/L — ABNORMAL LOW (ref 3.5–5.1)
Sodium: 136 mmol/L (ref 135–145)
Total Bilirubin: 0.6 mg/dL (ref 0.3–1.2)
Total Protein: 5.7 g/dL — ABNORMAL LOW (ref 6.5–8.1)

## 2021-06-18 LAB — CBC
HCT: 28.3 % — ABNORMAL LOW (ref 36.0–46.0)
Hemoglobin: 9.4 g/dL — ABNORMAL LOW (ref 12.0–15.0)
MCH: 32.3 pg (ref 26.0–34.0)
MCHC: 33.2 g/dL (ref 30.0–36.0)
MCV: 97.3 fL (ref 80.0–100.0)
Platelets: 179 10*3/uL (ref 150–400)
RBC: 2.91 MIL/uL — ABNORMAL LOW (ref 3.87–5.11)
RDW: 17 % — ABNORMAL HIGH (ref 11.5–15.5)
WBC: 6.9 10*3/uL (ref 4.0–10.5)
nRBC: 0 % (ref 0.0–0.2)

## 2021-06-18 LAB — PHOSPHORUS: Phosphorus: 6.6 mg/dL — ABNORMAL HIGH (ref 2.5–4.6)

## 2021-06-18 LAB — COMPLEMENT, TOTAL: Compl, Total (CH50): >60 U/mL (ref >41)

## 2021-06-18 LAB — ANTI-SMOOTH MUSCLE ANTIBODY, IGG: F-Actin IgG: 7 Units (ref 0–19)

## 2021-06-18 LAB — MITOCHONDRIAL ANTIBODIES: Mitochondrial M2 Ab, IgG: 20.8 Units — ABNORMAL HIGH (ref 0.0–20.0)

## 2021-06-18 LAB — IMMUNOFIXATION, URINE

## 2021-06-18 LAB — MAGNESIUM: Magnesium: 2.1 mg/dL (ref 1.7–2.4)

## 2021-06-18 SURGERY — ESOPHAGOGASTRODUODENOSCOPY (EGD) WITH PROPOFOL
Anesthesia: General

## 2021-06-18 MED ORDER — POLYETHYLENE GLYCOL 3350 17 G PO PACK
17.0000 g | PACK | Freq: Every day | ORAL | Status: DC
  Administered 2021-06-19 – 2021-06-21 (×3): 17 g via ORAL
  Filled 2021-06-18 (×3): qty 1

## 2021-06-18 MED ORDER — POTASSIUM CHLORIDE CRYS ER 20 MEQ PO TBCR
30.0000 meq | EXTENDED_RELEASE_TABLET | Freq: Once | ORAL | Status: AC
  Administered 2021-06-18: 30 meq via ORAL
  Filled 2021-06-18: qty 1

## 2021-06-18 MED ORDER — SODIUM CHLORIDE 0.9 % IV SOLN: INTRAVENOUS | Status: DC

## 2021-06-18 MED ORDER — ROSUVASTATIN CALCIUM 10 MG PO TABS: 10.0000 mg | ORAL_TABLET | Freq: Every evening | ORAL | Status: DC

## 2021-06-18 MED ORDER — PROPOFOL 10 MG/ML IV BOLUS
INTRAVENOUS | Status: DC | PRN
Start: 2021-06-18 — End: 2021-06-18
  Administered 2021-06-18 (×5): 20 mg via INTRAVENOUS

## 2021-06-18 MED ORDER — LORAZEPAM 2 MG/ML IJ SOLN
1.0000 mg | Freq: Once | INTRAMUSCULAR | Status: AC
  Administered 2021-06-18: 1 mg via INTRAVENOUS
  Filled 2021-06-18: qty 1

## 2021-06-18 MED ORDER — LACTATED RINGERS IV SOLN: INTRAVENOUS | Status: DC

## 2021-06-18 MED ORDER — PHENYLEPHRINE HCL (PRESSORS) 10 MG/ML IV SOLN
INTRAVENOUS | Status: DC | PRN
  Administered 2021-06-18: 200 ug via INTRAVENOUS

## 2021-06-18 MED ORDER — FERROUS SULFATE 325 (65 FE) MG PO TABS
325.0000 mg | ORAL_TABLET | Freq: Every day | ORAL | Status: DC
  Administered 2021-06-19 – 2021-06-21 (×3): 325 mg via ORAL
  Filled 2021-06-18 (×3): qty 1

## 2021-06-18 MED ORDER — ROSUVASTATIN CALCIUM 10 MG PO TABS
5.0000 mg | ORAL_TABLET | Freq: Every evening | ORAL | Status: DC
  Administered 2021-06-18 – 2021-06-20 (×3): 5 mg via ORAL
  Filled 2021-06-18 (×3): qty 1

## 2021-06-18 NOTE — Anesthesia Postprocedure Evaluation (Signed)
Anesthesia Post Note  Patient: Lauren Hamilton  Procedure(s) Performed: ESOPHAGOGASTRODUODENOSCOPY (EGD) WITH PROPOFOL POLYPECTOMY  Patient location during evaluation: Phase II Anesthesia Type: General Level of consciousness: awake Pain management: pain level controlled Vital Signs Assessment: post-procedure vital signs reviewed and stable Respiratory status: spontaneous breathing and respiratory function stable Cardiovascular status: blood pressure returned to baseline and stable Postop Assessment: no headache and no apparent nausea or vomiting Anesthetic complications: no Comments: Late entry   No notable events documented.   Last Vitals:  Vitals:   06/18/21 1116 06/18/21 1130  BP: (!) 100/52 (!) 90/48  Pulse: 82 77  Resp: 15 (!) 24  Temp: 36.7 C   SpO2: 93% 94%    Last Pain:  Vitals:   06/18/21 1116  TempSrc:   PainSc: [place]

## 2021-06-18 NOTE — Progress Notes (Signed)
Patient ID: Lauren Hamilton, female   DOB: 09-24-1945, 76 y.o.   MRN: [id number] S:Feeling better O:BP (!) 123/58 (BP Location: Right Arm)    Pulse 76    Temp 97.9 F (36.6 C) (Oral)    Resp 16    Ht 5\' 6"  (1.676 m)    Wt 105.2 kg    SpO2 95%    BMI 37.45 kg/m   Intake/Output Summary (Last 24 hours) at 06/18/2021 0934 Last data filed at 06/18/2021 0604 Gross per 24 hour  Intake 2105.53 ml  Output 2200 ml  Net -94.47 ml   Intake/Output: I/O last 3 completed shifts: In: 3347.6 [P.O.:960; I.V.:2387.6] Out: 2450 [Urine:2450]  Intake/Output this shift:  No intake/output data recorded. Weight change:  Gen:NAD CVS: RRR Resp: CTA Abd: +BS, soft, NT/ND Ext: no edema  Recent Labs  Lab 06/15/21 1306 06/15/21 2056 06/17/21 0546 06/18/21 0709  NA 135 133* 135 136  K 3.4* 2.9* 3.0* 3.4*  CL 95* 93* 97* 96*  CO2 22 20* 24 26  GLUCOSE 128* 166* 111* 97  BUN 121* 129* 118* 98*  CREATININE 5.87* 5.83* 5.36* 5.08*  ALBUMIN 4.0 3.9 3.1* 3.3*  CALCIUM 9.0 8.8* 8.7* 8.9  PHOS  --   --  7.8* 6.6*  AST 178* 177* 123* 102*  ALT 119* 117* 90* 85*   Liver Function Tests: Recent Labs  Lab 06/15/21 2056 06/17/21 0546 06/18/21 0709  AST 177* 123* 102*  ALT 117* 90* 85*  ALKPHOS 92 69 70  BILITOT 0.9 0.8 0.6  PROT 7.0 5.6* 5.7*  ALBUMIN 3.9 3.1* 3.3*   No results for input(s): LIPASE, AMYLASE in the last 168 hours. No results for input(s): AMMONIA in the last 168 hours. CBC: Recent Labs  Lab 06/15/21 2056 06/17/21 0546 06/18/21 0709  WBC 9.3 6.7 6.9  HGB 10.6* 8.8* 9.4*  HCT 31.2* 26.6* 28.3*  MCV 96.3 95.7 97.3  PLT 198 169 179   Cardiac Enzymes: No results for input(s): CKTOTAL, CKMB, CKMBINDEX, TROPONINI in the last 168 hours. CBG: Recent Labs  Lab 06/17/21 0707 06/17/21 1130 06/17/21 1640 06/17/21 2105 06/18/21 0753  GLUCAP 104* 130* 110* 114* 87    Iron Studies:  Recent Labs    06/16/21 0320  IRON 58  TIBC 445  FERRITIN 177   Studies/Results: US Abdomen  Complete  Result Date: 06/17/2021 CLINICAL DATA:  Abnormal LFTs EXAM: ABDOMEN ULTRASOUND COMPLETE COMPARISON:  No prior abdominal ultrasound, correlation is made with 06/16/2021 renal ultrasound. FINDINGS: Gallbladder: Surgically absent Common bile duct: Diameter: 6 mm Liver: No focal lesion identified. Slightly lobular liver contour. Increased parenchymal echogenicity. Portal vein is patent on color Doppler imaging with normal direction of blood flow towards the liver. IVC: No abnormality visualized. Pancreas: The tail is obscured by overlying bowel gas. Imaged portions are unremarkable. Spleen: Size and appearance within normal limits. Right Kidney: Length: 10.5 cm. Echogenicity within normal limits. No mass or hydronephrosis visualized. Left Kidney: Length: 10.9 cm. Echogenicity within normal limits. No mass or hydronephrosis visualized. Abdominal aorta: No aneurysm visualized. Other findings: None. IMPRESSION: Slightly lobular liver contour, suggestive of cirrhosis, with increased parenchymal echogenicity, suggestive of hepatic steatosis. Otherwise normal ultrasound of the abdomen. Electronically Signed   By: Merilyn Baba M.D.   On: 06/17/2021 11:27   US RENAL  Result Date: 06/16/2021 CLINICAL DATA:  Renal dysfunction EXAM: RENAL / URINARY TRACT ULTRASOUND COMPLETE COMPARISON:  01/07/2019 FINDINGS: Right Kidney: Renal measurements: 10.3 x 5 x 5.8 cm = volume: 154.7 mL.  Echogenicity within normal limits. No mass or hydronephrosis visualized. Left Kidney: Renal measurements: 10.5 x 5.8 x 5.3 cm = volume: 170 mL. Echogenicity within normal limits. No mass or hydronephrosis visualized. Bladder: Appears normal for degree of bladder distention. Other: There is possible 1.6 cm splenule adjacent to the spleen. IMPRESSION: There is no hydronephrosis. Electronically Signed   By: Elmer Picker M.D.   On: 06/16/2021 11:51    allopurinol  100 mg Oral QHS   carvedilol  6.25 mg Oral BID WC   DULoxetine  30 mg  Oral Daily   fluconazole  75 mg Oral Daily   insulin aspart  0-9 Units Subcutaneous TID WC   loratadine  10 mg Oral QPM   pantoprazole  40 mg Oral QAC breakfast    BMET    Component Value Date/Time   NA 136 06/18/2021 0709   K 3.4 (L) 06/18/2021 0709   CL 96 (L) 06/18/2021 0709   CO2 26 06/18/2021 0709   GLUCOSE 97 06/18/2021 0709   BUN 98 (H) 06/18/2021 0709   CREATININE 5.08 (H) 06/18/2021 0709   CREATININE 1.27 (H) 03/11/2020 0930   CALCIUM 8.9 06/18/2021 0709   GFRNONAA 8 (L) 06/18/2021 0709   GFRNONAA 42 (L) 03/11/2020 0930   GFRAA 48 (L) 03/11/2020 0930   CBC    Component Value Date/Time   WBC 6.9 06/18/2021 0709   RBC 2.91 (L) 06/18/2021 0709   HGB 9.4 (L) 06/18/2021 0709   HCT 28.3 (L) 06/18/2021 0709   PLT 179 06/18/2021 0709   MCV 97.3 06/18/2021 0709   MCH 32.3 06/18/2021 0709   MCHC 33.2 06/18/2021 0709   RDW 17.0 (H) 06/18/2021 0709   LYMPHSABS 2.2 10/31/2019 1250   MONOABS 0.5 10/31/2019 1250   EOSABS 0.5 10/31/2019 1250   BASOSABS 0.1 10/31/2019 1250      Assessment/Plan:  Subacute-AKI/CKD stage IIIb - Pt with history of heavy NSAID use in the past as well as obesity, DM, and HTN.  Scr had been ranging 1.3-1.5 until 02/05/21 when it rose to 1.91 and has had a more recent baseline of 1.7-1.9 until 05/13/21 (after starting valsartan) rose to 3.12.  UA with +blood, prot, and leukocytes.  Also with a drop in Hgb from 13.7 to 10.7 and heme + stools.  BUN disproportionally elevated.  DDx includes ischemic ATN in setting of ABLA and relative hypotension, acute GN, bilateral RAS with use of ARB. BUN/Cr improving and continue with IVF's for now. Serologies for acute GN negative (dsDNA, ANA, ASO, GBM), normal complements, UPEP pending. Renal US without obstruction Is completely asymptomatic and no indication for dialysis at this time. She may require a renal biopsy if no improvement with IVF's.  Renal dose medications, avoid nephrotoxic agents such as NSAIDs, IV  contrast, and phosphate containing bowel preps. SOB and DOE - V/Q scan negative for PE.  BNP mildly elevated at 147--> 63.  Possibly due to underlying lung disease as she is not overtly volume overloaded and CXR is clear. Anemia - with heme + stools.  Likely GIB from recent prednisone taper.  Follow H/H and transfuse as needed.  For EGD per GI Abnormal LFT's -  hepatitis panel negative.  [place] of liver pending.  GI consulted and Abd [place] with possible hepatic steatosis Hypokalemia - replete and follow. Hyponatremia - possibly related to AKI.  Hypochloremic. Improved with IVF's.  Donetta Potts, MD [employer] [phone]

## 2021-06-18 NOTE — Progress Notes (Signed)
Patient daughter wants to speak with nephrology, spoke with dr. Wynetta Emery and paged the on call kidney physician as instructed.

## 2021-06-18 NOTE — Transfer of Care (Signed)
Immediate Anesthesia Transfer of Care Note  Patient: Lauren Hamilton  Procedure(s) Performed: ESOPHAGOGASTRODUODENOSCOPY (EGD) WITH PROPOFOL POLYPECTOMY  Patient Location: PACU  Anesthesia Type:General  Level of Consciousness: awake, alert , oriented and patient cooperative  Airway & Oxygen Therapy: Patient Spontanous Breathing  Post-op Assessment: Report given to RN, Post -op Vital signs reviewed and stable and Patient moving all extremities X 4  Post vital signs: Reviewed and stable  Last Vitals:  Vitals Value Taken Time  BP 100/52 06/18/21 1116  Temp    Pulse 84 06/18/21 1118  Resp 21 06/18/21 1118  SpO2 93 % 06/18/21 1118  Vitals shown include unvalidated device data.  Last Pain:  Vitals:   06/18/21 1052  TempSrc:   PainSc: 5          Complications: No notable events documented.

## 2021-06-18 NOTE — Progress Notes (Signed)
Patient off the unit for procedure at this time.

## 2021-06-18 NOTE — Brief Op Note (Signed)
06/16/2021 - 06/18/2021  11:23 AM  PATIENT:  Lauren Hamilton  76 y.o. female  PRE-OPERATIVE DIAGNOSIS:  abdominal pain, anemai  POST-OPERATIVE DIAGNOSIS:  gastritis, gastric polyps, hiatal hernia  PROCEDURE:  Procedure(s): ESOPHAGOGASTRODUODENOSCOPY (EGD) WITH PROPOFOL (N/A) POLYPECTOMY  SURGEON:  Surgeon(s) and Role:    * Harvel Quale, MD - Primary  Patient underwent EGD under peripheral sedation.  Tolerated procedure adequately.  There was presence of a 2 cm hiatal hernia.  There were multiple gastric polyps located in the fundus, body and 1 isolated polyp in the pylorus.  3 of these polyps (including the one in the pylorus) have an inflammatory appearance, these were resected with a hot snare.  Normal duodenum.  RECOMMENDATIONS - Return patient to hospital ward for ongoing care.  - Resume previous diet.  - Await pathology results.  - Will need to restart ferrous sulfate at home daily and Miralax daily for drug related constipation. - Outpatient colonoscopy for evaluation of iron deficiency anemia. - Restart Xarelto tomorrow.  Maylon Peppers, MD Gastroenterology and Hepatology [facility]

## 2021-06-18 NOTE — Progress Notes (Signed)
[facility] will call to offer services in the home.

## 2021-06-18 NOTE — Plan of Care (Signed)

## 2021-06-18 NOTE — Progress Notes (Signed)
Patient arrived back from surgery with stable vitals.

## 2021-06-18 NOTE — Op Note (Signed)
[facility] Patient Name: Lauren Hamilton Procedure Date: 06/18/2021 10:40 AM MRN: [id number] Date of Birth: March 21, 1946 Attending MD: Maylon Peppers ,  CSN: [id number] Age: 76 Admit Type: Inpatient Procedure:                Upper GI endoscopy Indications:              Epigastric abdominal pain, Iron deficiency anemia Providers:                Maylon Peppers, Hughie Closs, RN, Tammy Vaught,                            RN, Nelma Rothman, Technician Referring MD:              Medicines:                Monitored Anesthesia Care Complications:            No immediate complications. Estimated Blood Loss:     Estimated blood loss: none. Procedure:                Pre-Anesthesia Assessment:                           - Prior to the procedure, a History and Physical                            was performed, and patient medications, allergies                            and sensitivities were reviewed. The patient's                            tolerance of previous anesthesia was reviewed.                           - The risks and benefits of the procedure and the                            sedation options and risks were discussed with the                            patient. All questions were answered and informed                            consent was obtained.                           - ASA Grade Assessment: III - A patient with severe                            systemic disease.                           After obtaining informed consent, the endoscope was                            passed under direct vision.  Throughout the                            procedure, the patient's blood pressure, pulse, and                            oxygen saturations were monitored continuously. The                            [id number]-[id number] ([id number]) scope was introduced through the                            mouth, and advanced to the second part of duodenum.                            The upper GI endoscopy was  accomplished without                            difficulty. The patient tolerated the procedure                            well. Scope In: 10:56:14 AM Scope Out: 11:12:41 AM Total Procedure Duration: 0 hours 16 minutes 27 seconds  Findings:      A 2 cm hiatal hernia was present.      Multiple 4 to 6 mm sessile polyps with no bleeding and stigmata of       recent bleeding were found in the gastric fundus, in the gastric body       and at the pylorus. The polyps that had an inflammatory appearance (two       in body and one in the pylorus) were removed with a hot snare. Resection       and retrieval were complete.      The examined duodenum was normal. Impression:               - 2 cm hiatal hernia.                           - Multiple gastric polyps. Resected and retrieved.                           - Normal examined duodenum. Moderate Sedation:      Per Anesthesia Care Recommendation:           - Return patient to hospital ward for ongoing care.                           - Resume previous diet.                           - Await pathology results.                           - Will need to restart ferrous sulfate at home                            daily and Miralax daily for  drug related                            constipation.                           - Outpatient colonoscopy for evaluation of iron                            deficiency anemia.                           - Restart Xarelto tomorrow. Procedure Code(s):        --- Professional ---                           [phone], Esophagogastroduodenoscopy, flexible,                            transoral; with removal of tumor(s), polyp(s), or                            other lesion(s) by snare technique Diagnosis Code(s):        --- Professional ---                           K44.9, Diaphragmatic hernia without obstruction or                            gangrene                           K31.7, Polyp of stomach and duodenum                            R10.13, Epigastric pain                           D50.9, Iron deficiency anemia, unspecified CPT copyright 2019 [employer]. All rights reserved. The codes documented in this report are preliminary and upon coder review may  be revised to meet current compliance requirements. Maylon Peppers, MD Maylon Peppers,  06/18/2021 11:26:07 AM This report has been signed electronically. Number of Addenda: 0

## 2021-06-18 NOTE — Progress Notes (Signed)
We will proceed with EGD as scheduled.  I thoroughly discussed with the patient his procedure, including the risks involved. Patient understands what the procedure involves including the benefits and any risks. Patient understands alternatives to the proposed procedure. Risks including (but not limited to) bleeding, tearing of the lining (perforation), rupture of adjacent organs, problems with heart and lung function, infection, and medication reactions. A small percentage of complications may require surgery, hospitalization, repeat endoscopic procedure, and/or transfusion.  Patient understood and agreed.  Tienna Bienkowski Castaneda, MD Gastroenterology and Hepatology [facility]  

## 2021-06-18 NOTE — Progress Notes (Signed)
PROGRESS NOTE   Lauren Hamilton  KXF:[id number] DOB: [birth year] DOA: 06/16/2021 PCP: Monico Blitz, MD   Chief Complaint  Patient presents with   Dehydration   Level of care: Telemetry  Brief Admission History:  76 y.o. female with medical history significant for stage IIIb CKD followed by Dr. 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  Patient had been complaining of progressive symptoms of dyspnea over the past month.  There has been no chest pain symptoms.  No fever or chills.  No cough.  Patient reports that she has been having hemorrhoids and had a colonoscopy in 2019 with polypectomy.  Patient reports urinary urgency but reports that she is having small amounts of urine output.  She has been followed by her nephrologist closely.  Assessment & Plan:   Principal Problem:   Acute-on-chronic kidney injury ([place]) Active Problems:   Pulmonary embolism (HCC)   Hypertension   Arthritis   Chronic rhinitis   Long term current use of anticoagulant therapy   Diabetes mellitus without complication (HCC)   Normochromic normocytic anemia   Stage 3b chronic kidney disease (CKD) (HCC)   Guaiac positive stools   Hx of colonic polyps   Elevated transaminase level   Hypokalemia   Gout   Hyperuricemia   Dyspnea   AKI on CKD stage IIIb -Appreciate nephrology consultation with Dr. Marval Regal -Gentle IV fluid hydration ordered -Renally dose medications as appropriate avoid nephrotoxic agent -Follow renal function panel -Renal ultrasound ruled out obstruction   Dyspnea -Acute pulmonary embolism has been ruled out on VQ scan -No signs or symptoms of heart failure -Supplemental oxygen as needed   Anemia in CKD -Hemoccult  positive stools concerning given patient is on Xarelto -I have asked for GI consultation for assistance with management  -temporarily holding Xarelto per GI  -Has a history of recurrent venous thromboembolism with stopping Xarelto -Follow CBC closely -Anemia panel is reassuring -EGD today with Dr. Jenetta Downer with multiple gastric polyps. Outpatient colonoscopy  -can restart xarelto 06/19/21   Elevated LFTs -hepatitis panel negative  -GI consultation ordered liver US with findings of steatosis    Hypokalemia -additional K and Mg ordered 1/5 -Recheck in a.m.   Metabolic acidosis -Secondary to AKI -resolved with IV fluid.   Type 2 diabetes mellitus with renal complications, uncontrolled -As evidenced by hemoglobin A1c of 8.1% -Continue SSI coverage and CBG monitoring -Add basal and prandial coverage for better glycemic control -lipid panel well controlled. CBG (last 3)  Recent Labs    06/18/21 1017 06/18/21 1201 06/18/21 1614  GLUCAP 105* 103* 126*    UTI-fungal -Fluconazole daily x3 days -Glycemic control orders   Gout -Continue daily allopurinol to treat hyperuricemia  DVT prophylaxis: SCDs Code Status: full  Family Communication: daughter at bedside  Disposition: anticipate DC  home  Status is: Inpatient  Remains inpatient appropriate because: IV fluids required for therapy   Consultants:  Nephrology GI   Procedures:  Renal US : no hydronephrosis Liver US:  hepatic steatosis   Antimicrobials:  N/a   Subjective: Pt says that everyday she seems to be getting better.    Objective: Vitals:   06/18/21 1130 06/18/21 1138 06/18/21 1154 06/18/21 1550  BP: (!) 90/48 (!) 102/55 (!) 98/50 (!) 107/51  Pulse: 77  81 76  Resp: (!)  24  19 20   Temp:   97.7 F (36.5 C) 98.6 F (37 C)  TempSrc:   Oral Oral  SpO2: 94%  98% 93%  Weight:      Height:        Intake/Output Summary (Last 24 hours) at 06/18/2021 1726 Last data filed at 06/18/2021 1611 Gross per 24 hour   Intake 2145.53 ml  Output 1550 ml  Net 595.53 ml   Filed Weights   06/15/21 1925  Weight: 105.2 kg   Examination:  General exam: Appears calm and comfortable  Respiratory system: Clear to auscultation. Respiratory effort normal. Cardiovascular system: normal S1 & S2 heard. No JVD, murmurs, rubs, gallops or clicks. No pedal edema. Gastrointestinal system: Abdomen is nondistended, soft and nontender. No organomegaly or masses felt. Normal bowel sounds heard. Central nervous system: Alert and oriented. No focal neurological deficits. Extremities: Symmetric 5 x 5 power. Skin: No rashes, lesions or ulcers Psychiatry: Judgement and insight appear normal. Mood & affect appropriate.   Data Reviewed: I have personally reviewed following labs and imaging studies  CBC: Recent Labs  Lab 06/15/21 2056 06/17/21 0546 06/18/21 0709  WBC 9.3 6.7 6.9  HGB 10.6* 8.8* 9.4*  HCT 31.2* 26.6* 28.3*  MCV 96.3 95.7 97.3  PLT 198 169 329    Basic Metabolic Panel: Recent Labs  Lab 06/15/21 1306 06/15/21 2056 06/17/21 0546 06/18/21 0709  NA 135 133* 135 136  K 3.4* 2.9* 3.0* 3.4*  CL 95* 93* 97* 96*  CO2 22 20* 24 26  GLUCOSE 128* 166* 111* 97  BUN 121* 129* 118* 98*  CREATININE 5.87* 5.83* 5.36* 5.08*  CALCIUM 9.0 8.8* 8.7* 8.9  MG  --  2.1 1.7 2.1  PHOS  --   --  7.8* 6.6*    GFR: Estimated Creatinine Clearance: 11.9 mL/min (A) (by C-G formula based on SCr of 5.08 mg/dL (H)).  Liver Function Tests: Recent Labs  Lab 06/15/21 1306 06/15/21 2056 06/17/21 0546 06/18/21 0709  AST 178* 177* 123* 102*  ALT 119* 117* 90* 85*  ALKPHOS 90 92 69 70  BILITOT 0.7 0.9 0.8 0.6  PROT 7.1 7.0 5.6* 5.7*  ALBUMIN 4.0 3.9 3.1* 3.3*    CBG: Recent Labs  Lab 06/17/21 2105 06/18/21 0753 06/18/21 1017 06/18/21 1201 06/18/21 1614  GLUCAP 114* 87 105* 103* 126*    Recent Results (from the past 240 hour(s))  Resp Panel by RT-PCR (Flu A&B, Covid) Nasopharyngeal Swab     Status: None    Collection Time: 06/16/21  3:30 AM   Specimen: Nasopharyngeal Swab; Nasopharyngeal(NP) swabs in vial transport medium  Result Value Ref Range Status   SARS Coronavirus 2 by RT PCR NEGATIVE NEGATIVE Final    Comment: (NOTE) SARS-CoV-2 target nucleic acids are NOT DETECTED.  The SARS-CoV-2 RNA is generally detectable in upper respiratory specimens during the acute phase of infection. The lowest concentration of SARS-CoV-2 viral copies this assay can detect is 138 copies/mL. A negative result does not preclude SARS-Cov-2 infection and should not be used as the sole basis for treatment or other patient management decisions. A negative result may occur with  improper specimen collection/handling, submission of specimen other than nasopharyngeal swab, presence of viral mutation(s) within the areas targeted by this assay, and inadequate number of viral copies(<138 copies/mL). A negative result must be combined with clinical observations, patient history, and epidemiological information. The expected result is Negative.  Fact Sheet for Patients:  EntrepreneurPulse.com.au  Fact Sheet for Healthcare Providers:  IncredibleEmployment.be  This test is no t yet approved or cleared by the [place] and  has been authorized for detection and/or diagnosis of SARS-CoV-2 by FDA under an Emergency Use Authorization (EUA). This EUA will remain  in effect (meaning this test can be used) for the duration of the COVID-19 declaration under Section 564(b)(1) of the Act, 21 U.S.C.section 360bbb-3(b)(1), unless the authorization is terminated  or revoked sooner.       Influenza A by PCR NEGATIVE NEGATIVE Final   Influenza B by PCR NEGATIVE NEGATIVE Final    Comment: (NOTE) The Xpert Xpress SARS-CoV-2/FLU/RSV plus assay is intended as an aid in the diagnosis of influenza from Nasopharyngeal swab specimens and should not be used as a sole basis for treatment.  Nasal washings and aspirates are unacceptable for Xpert Xpress SARS-CoV-2/FLU/RSV testing.  Fact Sheet for Patients: EntrepreneurPulse.com.au  Fact Sheet for Healthcare Providers: IncredibleEmployment.be  This test is not yet approved or cleared by the [place] FDA and has been authorized for detection and/or diagnosis of SARS-CoV-2 by FDA under an Emergency Use Authorization (EUA). This EUA will remain in effect (meaning this test can be used) for the duration of the COVID-19 declaration under Section 564(b)(1) of the Act, 21 U.S.C. section 360bbb-3(b)(1), unless the authorization is terminated or revoked.  Performed at [facility], [address], [place], [place] [address]      Radiology Studies: US Abdomen Complete  Result Date: 06/17/2021 CLINICAL DATA:  Abnormal LFTs EXAM: ABDOMEN ULTRASOUND COMPLETE COMPARISON:  No prior abdominal ultrasound, correlation is made with 06/16/2021 renal ultrasound. FINDINGS: Gallbladder: Surgically absent Common bile duct: Diameter: 6 mm Liver: No focal lesion identified. Slightly lobular liver contour. Increased parenchymal echogenicity. Portal vein is patent on color Doppler imaging with normal direction of blood flow towards the liver. IVC: No abnormality visualized. Pancreas: The tail is obscured by overlying bowel gas. Imaged portions are unremarkable. Spleen: Size and appearance within normal limits. Right Kidney: Length: 10.5 cm. Echogenicity within normal limits. No mass or hydronephrosis visualized. Left Kidney: Length: 10.9 cm. Echogenicity within normal limits. No mass or hydronephrosis visualized. Abdominal aorta: No aneurysm visualized. Other findings: None. IMPRESSION: Slightly lobular liver contour, suggestive of cirrhosis, with increased parenchymal echogenicity, suggestive of hepatic steatosis. Otherwise normal ultrasound of the abdomen. Electronically Signed   By: Merilyn Baba M.D.   On:  06/17/2021 11:27    Scheduled Meds:  allopurinol  100 mg Oral QHS   carvedilol  6.25 mg Oral BID WC   DULoxetine  30 mg Oral Daily   insulin aspart  0-9 Units Subcutaneous TID WC   loratadine  10 mg Oral QPM   pantoprazole  40 mg Oral QAC breakfast   Continuous Infusions:  lactated ringers 70 mL/hr at 06/18/21 1051    LOS: 2 days   Time spent: 35 mins   Aquilla Voiles Wynetta Emery, MD How to contact the [facility] Attending or Consulting provider [place] or covering provider during after hours [place], for this patient?  Check the care team in [facility] and look for a) attending/consulting [facility] provider listed and b) the [facility] team listed Log into [email or web address] and use [place]'s universal password to access. If you do not have the password, please contact the hospital operator. Locate the [facility] provider you are looking for under [facility] and page to a number that you can be directly reached. If you still have difficulty reaching the provider, please page the [facility] (Director on Call) for the Hospitalists  listed on amion for assistance.  06/18/2021, 5:26 PM

## 2021-06-18 NOTE — Anesthesia Preprocedure Evaluation (Signed)
Anesthesia Evaluation  Patient identified by MRN, date of birth, ID band Patient awake    Reviewed: Allergy & Precautions, H&P , NPO status , Patient's Chart, lab work & pertinent test results, reviewed documented beta blocker date and time   Airway Mallampati: II  TM Distance: >3 FB Neck ROM: full    Dental no notable dental hx.    Pulmonary shortness of breath,    Pulmonary exam normal breath sounds clear to auscultation       Cardiovascular Exercise Tolerance: Good hypertension, negative cardio ROS   Rhythm:regular Rate:Normal     Neuro/Psych negative neurological ROS  negative psych ROS   GI/Hepatic negative GI ROS, Neg liver ROS,   Endo/Other  negative endocrine ROSdiabetes  Renal/GU CRF and ARFRenal disease  negative genitourinary   Musculoskeletal   Abdominal   Peds  Hematology  (+) Blood dyscrasia, anemia ,   Anesthesia Other Findings   Reproductive/Obstetrics negative OB ROS                             Anesthesia Physical Anesthesia Plan  ASA: 4 and emergent  Anesthesia Plan: General   Post-op Pain Management:    Induction:   PONV Risk Score and Plan: Propofol infusion  Airway Management Planned:   Additional Equipment:   Intra-op Plan:   Post-operative Plan:   Informed Consent: I have reviewed the patients History and Physical, chart, labs and discussed the procedure including the risks, benefits and alternatives for the proposed anesthesia with the patient or authorized representative who has indicated his/her understanding and acceptance.     Dental Advisory Given  Plan Discussed with: CRNA  Anesthesia Plan Comments:         Anesthesia Quick Evaluation

## 2021-06-19 LAB — HEPATIC FUNCTION PANEL
ALT: 69 U/L — ABNORMAL HIGH (ref 0–44)
AST: 69 U/L — ABNORMAL HIGH (ref 15–41)
Albumin: 3.2 g/dL — ABNORMAL LOW (ref 3.5–5.0)
Alkaline Phosphatase: 64 U/L (ref 38–126)
Bilirubin, Direct: <0.1 mg/dL (ref 0.0–0.2)
Total Bilirubin: 0.6 mg/dL (ref 0.3–1.2)
Total Protein: 5.5 g/dL — ABNORMAL LOW (ref 6.5–8.1)

## 2021-06-19 LAB — BASIC METABOLIC PANEL
Anion gap: 11 (ref 5–15)
BUN: 87 mg/dL — ABNORMAL HIGH (ref 8–23)
CO2: 25 mmol/L (ref 22–32)
Calcium: 9 mg/dL (ref 8.9–10.3)
Chloride: 101 mmol/L (ref 98–111)
Creatinine, Ser: 4.5 mg/dL — ABNORMAL HIGH (ref 0.44–1.00)
GFR, Estimated: 10 mL/min — ABNORMAL LOW (ref >60)
Glucose, Bld: 119 mg/dL — ABNORMAL HIGH (ref 70–99)
Potassium: 3.1 mmol/L — ABNORMAL LOW (ref 3.5–5.1)
Sodium: 137 mmol/L (ref 135–145)

## 2021-06-19 LAB — PHOSPHORUS: Phosphorus: 5.5 mg/dL — ABNORMAL HIGH (ref 2.5–4.6)

## 2021-06-19 LAB — CBC
HCT: 26.7 % — ABNORMAL LOW (ref 36.0–46.0)
Hemoglobin: 8.8 g/dL — ABNORMAL LOW (ref 12.0–15.0)
MCH: 32.8 pg (ref 26.0–34.0)
MCHC: 33 g/dL (ref 30.0–36.0)
MCV: 99.6 fL (ref 80.0–100.0)
Platelets: 168 10*3/uL (ref 150–400)
RBC: 2.68 MIL/uL — ABNORMAL LOW (ref 3.87–5.11)
RDW: 17.1 % — ABNORMAL HIGH (ref 11.5–15.5)
WBC: 5.9 10*3/uL (ref 4.0–10.5)
nRBC: 0 % (ref 0.0–0.2)

## 2021-06-19 LAB — GLUCOSE, CAPILLARY
Glucose-Capillary: 104 mg/dL — ABNORMAL HIGH (ref 70–99)
Glucose-Capillary: 127 mg/dL — ABNORMAL HIGH (ref 70–99)
Glucose-Capillary: 122 mg/dL — ABNORMAL HIGH (ref 70–99)
Glucose-Capillary: 127 mg/dL — ABNORMAL HIGH (ref 70–99)

## 2021-06-19 LAB — MAGNESIUM: Magnesium: 1.8 mg/dL (ref 1.7–2.4)

## 2021-06-19 MED ORDER — APIXABAN 2.5 MG PO TABS
5.0000 mg | ORAL_TABLET | Freq: Two times a day (BID) | ORAL | Status: DC
  Administered 2021-06-19 – 2021-06-21 (×5): 5 mg via ORAL
  Filled 2021-06-19 (×5): qty 2

## 2021-06-19 MED ORDER — POTASSIUM CHLORIDE CRYS ER 20 MEQ PO TBCR
30.0000 meq | EXTENDED_RELEASE_TABLET | Freq: Two times a day (BID) | ORAL | Status: DC
  Administered 2021-06-19: 30 meq via ORAL
  Filled 2021-06-19: qty 1

## 2021-06-19 MED ORDER — MAGNESIUM SULFATE 2 GM/50ML IV SOLN
2.0000 g | Freq: Once | INTRAVENOUS | Status: AC
Start: 2021-06-19 — End: 2021-06-19
  Administered 2021-06-19: 2 g via INTRAVENOUS
  Filled 2021-06-19: qty 50

## 2021-06-19 MED ORDER — LORAZEPAM 2 MG/ML IJ SOLN
1.0000 mg | Freq: Once | INTRAMUSCULAR | Status: AC
  Administered 2021-06-19: 1 mg via INTRAVENOUS
  Filled 2021-06-19: qty 1

## 2021-06-19 MED ORDER — POTASSIUM CHLORIDE CRYS ER 20 MEQ PO TBCR
30.0000 meq | EXTENDED_RELEASE_TABLET | Freq: Once | ORAL | Status: AC
  Administered 2021-06-19: 30 meq via ORAL
  Filled 2021-06-19: qty 1

## 2021-06-19 NOTE — Progress Notes (Signed)
Maylon Peppers, M.D. Gastroenterology & Hepatology   Interval History:  Patient reports well and denies having any complaints.  States that she wants to go home soon as possible. No episodes of melena or hematochezia, abdominal pain, mental distention, nausea or vomiting.  Has been tolerating diet adequately. Was started today on apixaban 5 mg twice daily due to renal function, not a candidate for Xarelto. Her most recent labs today showed a stable hemoglobin of 8.8 with MCV of 99, BUN remains elevated at 87 but her creatinine has also been elevated at 4.5.  Notably her liver function tests have also decreased with AST of 69, ALT of 69, total bilirubin of 0.6 and alkaline phosphatase of 64.  Inpatient Medications:  Current Facility-Administered Medications:    acetaminophen (TYLENOL) tablet 650 mg, 650 mg, Oral, Q6H PRN, 650 mg at 06/18/21 0720 **OR** acetaminophen (TYLENOL) suppository 650 mg, 650 mg, Rectal, Q6H PRN, Johnson, Clanford L, MD   allopurinol (ZYLOPRIM) tablet 100 mg, 100 mg, Oral, QHS, Adefeso, Oladapo, DO, 100 mg at 06/18/21 2246   apixaban (ELIQUIS) tablet 5 mg, 5 mg, Oral, BID, Johnson, Clanford L, MD, 5 mg at 06/19/21 0949   azelastine (ASTELIN) 0.1 % nasal spray 2 spray, 2 spray, Each Nare, BID PRN, Wynetta Emery, Clanford L, MD   carvedilol (COREG) tablet 6.25 mg, 6.25 mg, Oral, BID WC, Johnson, Clanford L, MD, 6.25 mg at 06/19/21 0945   DULoxetine (CYMBALTA) DR capsule 30 mg, 30 mg, Oral, Daily, Johnson, Clanford L, MD, 30 mg at 06/19/21 0949   ferrous sulfate tablet 325 mg, 325 mg, Oral, Q breakfast, Johnson, Clanford L, MD, 325 mg at 06/19/21 0951   insulin aspart (novoLOG) injection 0-9 Units, 0-9 Units, Subcutaneous, TID WC, Johnson, Clanford L, MD, 1 Units at 06/18/21 1710   lactated ringers infusion, , Intravenous, Continuous, Johnson, Clanford L, MD, Last Rate: 70 mL/hr at 06/19/21 1051, Restarted at 06/19/21 1051   loratadine (CLARITIN) tablet 10 mg, 10 mg, Oral, QPM,  Johnson, Clanford L, MD, 10 mg at 06/18/21 1710   metoprolol tartrate (LOPRESSOR) injection 5 mg, 5 mg, Intravenous, Q6H PRN, Johnson, Clanford L, MD   ondansetron (ZOFRAN) tablet 4 mg, 4 mg, Oral, Q6H PRN, 4 mg at 06/17/21 1219 **OR** ondansetron (ZOFRAN) injection 4 mg, 4 mg, Intravenous, Q6H PRN, Johnson, Clanford L, MD   pantoprazole (PROTONIX) EC tablet 40 mg, 40 mg, Oral, QAC breakfast, Rehman, Najeeb U, MD, 40 mg at 06/19/21 0950   polyethylene glycol (MIRALAX / GLYCOLAX) packet 17 g, 17 g, Oral, Daily, Johnson, Clanford L, MD, 17 g at 06/19/21 0944   polyvinyl alcohol (LIQUIFILM TEARS) 1.4 % ophthalmic solution 1-2 drop, 1-2 drop, Both Eyes, PRN, Johnson, Clanford L, MD   potassium chloride (KLOR-CON M) CR tablet 30 mEq, 30 mEq, Oral, BID, Johnson, Clanford L, MD, 30 mEq at 06/19/21 0950   rosuvastatin (CRESTOR) tablet 5 mg, 5 mg, Oral, QPM, Johnson, Clanford L, MD, 5 mg at 06/18/21 1825   sodium chloride (OCEAN) 0.65 % nasal spray 1 spray, 1 spray, Each Nare, PRN, Wynetta Emery, Clanford L, MD   I/O    Intake/Output Summary (Last 24 hours) at 06/19/2021 1201 Last data filed at 06/19/2021 1001 Gross per 24 hour  Intake --  Output 2500 ml  Net -2500 ml     Physical Exam: Temp:  [98.1 F (36.7 C)-98.6 F (37 C)] 98.5 F (36.9 C) (01/07 0615) Pulse Rate:  [72-79] 79 (01/07 0945) Resp:  [18-20] 18 (01/07 0615) BP: (105-113)/(51-93) 112/51 (01/07  0945) SpO2:  [90 %-93 %] 91 % (01/07 0615)  Temp (24hrs), Avg:98.4 F (36.9 C), Min:98.1 F (36.7 C), Max:98.6 F (37 C) GENERAL: The patient is AO x3, in no acute distress. HEENT: Head is normocephalic and atraumatic. EOMI are intact. Mouth is well hydrated and without lesions. NECK: Supple. No masses LUNGS: Clear to auscultation. No presence of rhonchi/wheezing/rales. Adequate chest expansion HEART: RRR, normal s1 and s2. ABDOMEN: Soft, nontender, no guarding, no peritoneal signs, and nondistended. BS +. No masses. EXTREMITIES: Without any  cyanosis, clubbing, rash, lesions or edema. NEUROLOGIC: AOx3, no focal motor deficit. SKIN: no jaundice, no rashes  Laboratory Data: CBC:     Component Value Date/Time   WBC 5.9 06/19/2021 0526   RBC 2.68 (L) 06/19/2021 0526   HGB 8.8 (L) 06/19/2021 0526   HCT 26.7 (L) 06/19/2021 0526   PLT 168 06/19/2021 0526   MCV 99.6 06/19/2021 0526   MCH 32.8 06/19/2021 0526   MCHC 33.0 06/19/2021 0526   RDW 17.1 (H) 06/19/2021 0526   LYMPHSABS 2.2 10/31/2019 1250   MONOABS 0.5 10/31/2019 1250   EOSABS 0.5 10/31/2019 1250   BASOSABS 0.1 10/31/2019 1250   COAG:  Lab Results  Component Value Date   INR 1.09 10/13/2017    BMP:  BMP Latest Ref Rng & Units 06/19/2021 06/18/2021 06/17/2021  Glucose 70 - 99 mg/dL 119(H) 97 111(H)  BUN 8 - 23 mg/dL 87(H) 98(H) 118(H)  Creatinine 0.44 - 1.00 mg/dL 4.50(H) 5.08(H) 5.36(H)  BUN/Creat Ratio 6 - 22 (calc) - - -  Sodium 135 - 145 mmol/L 137 136 135  Potassium 3.5 - 5.1 mmol/L 3.1(L) 3.4(L) 3.0(L)  Chloride 98 - 111 mmol/L 101 96(L) 97(L)  CO2 22 - 32 mmol/L 25 26 24   Calcium 8.9 - 10.3 mg/dL 9.0 8.9 8.7(L)    HEPATIC:  Hepatic Function Latest Ref Rng & Units 06/19/2021 06/18/2021 06/17/2021  Total Protein 6.5 - 8.1 g/dL 5.5(L) 5.7(L) 5.6(L)  Albumin 3.5 - 5.0 g/dL 3.2(L) 3.3(L) 3.1(L)  AST 15 - 41 U/L 69(H) 102(H) 123(H)  ALT 0 - 44 U/L 69(H) 85(H) 90(H)  Alk Phosphatase 38 - 126 U/L 64 70 69  Total Bilirubin 0.3 - 1.2 mg/dL 0.6 0.6 0.8  Bilirubin, Direct 0.0 - 0.2 mg/dL <0.1 - -    CARDIAC:  Lab Results  Component Value Date   TROPONINI <0.03 10/14/2017      Imaging: I personally reviewed and interpreted the available labs, imaging and endoscopic files.   Assessment/Plan: Is a 76 year old female with past medical history of diabetes, hypertension, COPD, bilateral PEs on chronic anticoagulation, gout, CKD, iron deficiency anemia, who was admitted to the hospital after presenting worsening shortness of breath.  The patient was found to have  multiple derangements in her labs including elevated aminotransferases and mild anemia for which gastroenterology was consulted.  The patient has not presented any melena or hematochezia (no overt gastrointestinal bleeding).  Has not required any blood transfusion but her hemoglobin was not normal with mildly low iron saturation of 13%.  She has not been taking her oral iron as asked in the past.  Underwent an EGD during this admission which showed presence of a 2 cm hiatal hernia and multiple gastric polyps (3 of these polyps were removed with a hot snare), normal duodenum.  She has remained hemodynamically stable and has not present any further drop in her hemoglobin during the current admission.  At this point, we will advise continuing her current diet and  she will need to continue her pantoprazole 40 mg every day.  We will see her in the GI office for evaluation of repeat colonoscopy to evaluate her chronic anemia.  Will need to follow the pathology results of her gastric polyps.  She was restarted on Eliquis today, will need to trend her hemoglobin on a daily basis.  On the other hand, she presented an acute increase in her aminotransferases which fortunately have down trended.  She had an exposure to an antibiotic recently (she does not remember the name of the medication), which could have led to drug-induced liver injury.  I advised him to ask to her primary care for the name of the antibiotic she took and avoid it in the future.  Work-up for autoimmune causes such as autoimmune hepatitis, alpha-1 antitrypsin deficiency, hepatitis a, B and C, and hemochromatosis was negative.  She had mildly elevated AMA with a normal IgM -I consider it is less likely she has PBC as her alkaline phosphatase is normal but this will need to be followed in the clinic.  -Trend H&H daily -Continue pantoprazole 40 mg every day -Follow-up gastric polyp pathology -We will discuss outpatient colonoscopy in GI office -She  will need to restart her oral iron on a daily basis and will need to take MiraLAX on a daily basis to avoid constipation - Patient will follow up in GI clinic with Dr. Gala Romney in 3-4 weeks. - GI service will sign-off, please call us back if you have any more questions.  Maylon Peppers, MD Gastroenterology and Hepatology [facility]

## 2021-06-19 NOTE — Plan of Care (Signed)
  Problem: Health Behavior/Discharge Planning: Goal: Ability to manage health-related needs will improve Outcome: Progressing   Problem: Clinical Measurements: Goal: Ability to maintain clinical measurements within normal limits will improve Outcome: Progressing   

## 2021-06-19 NOTE — Evaluation (Signed)
Physical Therapy Evaluation Patient Details Name: Lauren Hamilton MRN: [id number] DOB: 1946-05-07 Today's Date: 06/19/2021  History of Present Illness  76 y.o. female with medical history significant for stage IIIb CKD followed by Dr. 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  Patient had been complaining of progressive symptoms of dyspnea over the past month.  There has been no chest pain symptoms.  No fever or chills.  No cough.  Patient reports that she has been having hemorrhoids and had a colonoscopy in 2019 with polypectomy.  Patient reports urinary urgency but reports that she is having small amounts of urine output.  She has been followed by her nephrologist closely.   Clinical Impression  Pt admitted with above diagnosis. Patient agreeable to participating in PT evaluation today. Daughter present for subjective information and at end of session. Patient able to perform much of her mobility with supervision to min guard assistance. Patient somewhat unsteady when attempting transfers and ambulation with [facility] and is more steady with use of RW. Patient limited by fatigue. On room air throughout session. Patient advised to use RW while remaining in this venue and initially when returning home. Patient reported a fall connected to left knee buckling and giving out on her and that she felt she was due for a left TKA.  Pt currently with functional limitations due to the deficits listed below (see PT Problem List). Pt will benefit from skilled PT to increase their independence and safety with mobility to allow discharge to the venue listed below.        Recommendations for follow up therapy are one component of a multi-disciplinary discharge  planning process, led by the attending physician.  Recommendations may be updated based on patient status, additional functional criteria and insurance authorization.  Follow Up Recommendations Home health PT    Assistance Recommended at Discharge Intermittent Supervision/Assistance  Patient can return home with the following  A little help with walking and/or transfers;A little help with bathing/dressing/bathroom;Assistance with cooking/housework;Help with stairs or ramp for entrance    Equipment Recommendations None recommended by PT  Recommendations for Other Services       Functional Status Assessment Patient has had a recent decline in their functional status and demonstrates the ability to make significant improvements in function in a reasonable and predictable amount of time.     Precautions / Restrictions Precautions Precautions: Fall Precaution Comments: patient reports 2 falls in last six months with no injury Restrictions Weight Bearing Restrictions: No      Mobility  Bed Mobility Overal bed mobility: Needs Assistance Bed Mobility: Supine to Sit;Sit to Supine     Supine to sit: Supervision Sit to supine: Supervision        Transfers Overall transfer level: Needs assistance Equipment used: Straight cane Transfers: Sit to/from Stand;Bed to chair/wheelchair/BSC Sit to Stand: Min guard;Supervision   Step pivot transfers: Min guard       General transfer comment: increased time, somewhat slow transfers    Ambulation/Gait Ambulation/Gait assistance: Min guard Gait Distance (Feet): 250 Feet Assistive device: Straight cane Gait Pattern/deviations: Step-through pattern;Decreased step length - right;Decreased step length - left;Decreased stance time - left;Decreased weight shift to left;Antalgic       General Gait Details: slow, labored cadence with SPC in right hand, reaching for objects with  left in hallway; on room air; limited by fatigue; no knee  buckling noted on left during ambulation  Stairs            Wheelchair Mobility    Modified Rankin (Stroke Patients Only)       Balance Overall balance assessment: Needs assistance Sitting-balance support: No upper extremity supported;Feet supported Sitting balance-Leahy Scale: Good     Standing balance support: Single extremity supported;During functional activity;Reliant on assistive device for balance   Standing balance comment: fair with SPC, fair/good with RW           Pertinent Vitals/Pain Pain Assessment: No/denies pain    Home Living Family/patient expects to be discharged to:: Private residence Living Arrangements: Children (lives with daughter) Available Help at Discharge: Family;Available 24 hours/day Type of Home: House Home Access: Stairs to enter Entrance Stairs-Rails: None;Can reach Chartered loss adjuster of Steps: 2 + 3   Home Layout: One level Home Equipment: International aid/development worker (2 wheels);Rollator (4 wheels);Cane - single point;Wheelchair - manual      Prior Function   Mobility Comments: at baseline does not use an assistive device household distances; could walk behind cart at grocery store; continues to drive ADLs Comments: assistance for cook, clean and landry     Hand Dominance        Extremity/Trunk Assessment   Upper Extremity Assessment Upper Extremity Assessment: Generalized weakness    Lower Extremity Assessment Lower Extremity Assessment: Generalized weakness;LLE deficits/detail LLE Deficits / Details: painful left knee that patient reports gives out on her.    Cervical / Trunk Assessment Cervical / Trunk Assessment: Kyphotic  Communication   Communication: No difficulties  Cognition Arousal/Alertness: Awake/alert Behavior During Therapy: WFL for tasks assessed/performed Overall Cognitive Status: Within Functional Limits for tasks assessed          General Comments       Exercises     Assessment/Plan    PT Assessment Patient needs continued PT services  PT Problem List Decreased mobility;Decreased activity tolerance;Decreased balance;Decreased knowledge of use of DME       PT Treatment Interventions DME instruction;Therapeutic exercise;Gait training;Balance training;Stair training;Neuromuscular re-education;Functional mobility training;Therapeutic activities;Patient/family education    PT Goals (Current goals can be found in the Care Plan section)  Acute Rehab PT Goals Patient Stated Goal: Go home with no HHPT for left knee at this time. PT Goal Formulation: With patient/family Time For Goal Achievement: 07/03/21 Potential to Achieve Goals: Fair    Frequency Min 3X/week        AM-PAC PT "6 Clicks" Mobility  Outcome Measure Help needed turning from your back to your side while in a flat bed without using bedrails?: None Help needed moving from lying on your back to sitting on the side of a flat bed without using bedrails?: A Little Help needed moving to and from a bed to a chair (including a wheelchair)?: A Little Help needed standing up from a chair using your arms (e.g., wheelchair or bedside chair)?: A Little Help needed to walk in hospital room?: A Little Help needed climbing 3-5 steps with a railing? : A Little 6 Click Score: 19    End of Session   Activity Tolerance: Patient tolerated treatment well;Patient limited by fatigue Patient left: in bed;with call bell/phone within reach;with family/visitor present Nurse Communication: Mobility status PT Visit Diagnosis: Unsteadiness on feet (R26.81);History of falling (Z91.81);Other abnormalities of gait and mobility (R26.89);Difficulty in walking, not elsewhere classified (R26.2)    Time: 9379-0240 PT Time Calculation (min) (  ACUTE ONLY): 28 min   Charges:   PT Evaluation $PT Eval Low Complexity: 1 Low PT Treatments $Therapeutic Activity: 8-22 mins        Floria Raveling.  Hartnett-Rands, MS, PT Per [place] [phone]  Pamala Hurry  Hartnett-Rands 06/19/2021, 12:43 PM

## 2021-06-19 NOTE — Progress Notes (Signed)
PROGRESS NOTE   Lauren Hamilton  ACZ:[id number] DOB: [birth year] DOA: 06/16/2021 PCP: Monico Blitz, MD   Chief Complaint  Patient presents with   Dehydration   Level of care: Med-Surg  Brief Admission History:  76 y.o. female with medical history significant for stage IIIb CKD followed by Dr. 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  Patient had been complaining of progressive symptoms of dyspnea over the past month.  There has been no chest pain symptoms.  No fever or chills.  No cough.  Patient reports that she has been having hemorrhoids and had a colonoscopy in 2019 with polypectomy.  Patient reports urinary urgency but reports that she is having small amounts of urine output.  She has been followed by her nephrologist closely.  Assessment & Plan:   Principal Problem:   Acute-on-chronic kidney injury ([place]) Active Problems:   Pulmonary embolism (HCC)   Hypertension   Arthritis   Chronic rhinitis   Long term current use of anticoagulant therapy   Diabetes mellitus without complication (HCC)   Normochromic normocytic anemia   Stage 3b chronic kidney disease (CKD) (HCC)   Guaiac positive stools   Hx of colonic polyps   Elevated transaminase level   Hypokalemia   Gout   Hyperuricemia   Dyspnea   AKI on CKD stage IIIb -Appreciate nephrology consultation with Dr. Marval Regal -Gentle IV fluid hydration ordered -Renally dose medications as appropriate avoid nephrotoxic agent -Follow renal function panel -Renal ultrasound ruled out obstruction   Dyspnea -Acute pulmonary embolism has been ruled out on VQ scan -No signs or symptoms of heart failure -Supplemental oxygen as needed   Anemia in CKD -Hemoccult  positive stools concerning given patient is on Xarelto -I have asked for GI consultation for assistance with management  -temporarily held Xarelto per GI for procedure -Has a history of recurrent venous thromboembolism with stopping Xarelto -Follow CBC closely -Anemia panel is reassuring -EGD w/ Dr. Jenetta Downer with multiple gastric polyps. Outpatient colonoscopy recommended -unable to restart xarelto due to GFR, started apixaban 5 mg BID on 06/19/21 per pharm D L. Trenton Gammon  Elevated LFTs -hepatitis panel negative  -GI consultation ordered liver US with findings of steatosis    Hypokalemia -additional K and Mg ordered 1/5, 1/6, 1/7 -Recheck in a.m.   Metabolic acidosis -Secondary to AKI -resolved with IV fluid.   Type 2 diabetes mellitus with renal complications, uncontrolled -As evidenced by hemoglobin A1c of 8.1% -Continue SSI coverage and CBG monitoring -Add basal and prandial coverage for better glycemic control -lipid panel well controlled. CBG (last 3)  Recent Labs    06/18/21 1614 06/18/21 2205 06/19/21 0736  GLUCAP 126* 127* 104*    UTI-fungal -Fluconazole daily x3 doses completed  -Glycemic control orders   Gout -Continue daily allopurinol to treat hyperuricemia  DVT prophylaxis: SCDs Code Status: full  Family Communication: daughter at bedside  Disposition: anticipate DC  home  Status is: Inpatient  Remains inpatient appropriate because: IV fluids required for therapy   Consultants:  Nephrology GI   Procedures:  Renal US : no hydronephrosis Liver US:  hepatic steatosis   Antimicrobials:  N/a   Subjective: Pt says that everyday she seems to be getting better.    Objective: Vitals:   06/18/21 1154 06/18/21 1550 06/18/21 2255 06/19/21 1093  BP: (!) 98/50 (!) 107/51 (!) 105/93 (!) 113/59  Pulse: 81 76 74 72  Resp: 19 20 18 18   Temp: 97.7 F (36.5 C) 98.6 F (37 C) 98.1 F (36.7 C) 98.5 F (36.9 C)  TempSrc: Oral Oral Oral   SpO2: 98% 93% 90%  91%  Weight:      Height:        Intake/Output Summary (Last 24 hours) at 06/19/2021 0925 Last data filed at 06/19/2021 0500 Gross per 24 hour  Intake 520 ml  Output 2350 ml  Net -1830 ml   Filed Weights   06/15/21 1925  Weight: 105.2 kg   Examination:  General exam: Appears calm and comfortable  Respiratory system: Clear to auscultation. Respiratory effort normal. Cardiovascular system: normal S1 & S2 heard. No JVD, murmurs, rubs, gallops or clicks. No pedal edema. Gastrointestinal system: Abdomen is nondistended, soft and nontender. No organomegaly or masses felt. Normal bowel sounds heard. Central nervous system: Alert and oriented. No focal neurological deficits. Extremities: Symmetric 5 x 5 power. Skin: No rashes, lesions or ulcers Psychiatry: Judgement and insight appear normal. Mood & affect appropriate.   Data Reviewed: I have personally reviewed following labs and imaging studies  CBC: Recent Labs  Lab 06/15/21 2056 06/17/21 0546 06/18/21 0709 06/19/21 0526  WBC 9.3 6.7 6.9 5.9  HGB 10.6* 8.8* 9.4* 8.8*  HCT 31.2* 26.6* 28.3* 26.7*  MCV 96.3 95.7 97.3 99.6  PLT 198 169 179 323    Basic Metabolic Panel: Recent Labs  Lab 06/15/21 1306 06/15/21 2056 06/17/21 0546 06/18/21 0709 06/19/21 0526 06/19/21 0527  NA 135 133* 135 136  --  137  K 3.4* 2.9* 3.0* 3.4*  --  3.1*  CL 95* 93* 97* 96*  --  101  CO2 22 20* 24 26  --  25  GLUCOSE 128* 166* 111* 97  --  119*  BUN 121* 129* 118* 98*  --  87*  CREATININE 5.87* 5.83* 5.36* 5.08*  --  4.50*  CALCIUM 9.0 8.8* 8.7* 8.9  --  9.0  MG  --  2.1 1.7 2.1 1.8  --   PHOS  --   --  7.8* 6.6* 5.5*  --     GFR: Estimated Creatinine Clearance: 13.5 mL/min (A) (by C-G formula based on SCr of 4.5 mg/dL (H)).  Liver Function Tests: Recent Labs  Lab 06/15/21 1306 06/15/21 2056 06/17/21 0546 06/18/21 0709  AST 178* 177* 123* 102*  ALT 119* 117* 90* 85*  ALKPHOS 90 92 69 70  BILITOT 0.7 0.9 0.8 0.6  PROT 7.1 7.0  5.6* 5.7*  ALBUMIN 4.0 3.9 3.1* 3.3*    CBG: Recent Labs  Lab 06/18/21 1017 06/18/21 1201 06/18/21 1614 06/18/21 2205 06/19/21 0736  GLUCAP 105* 103* 126* 127* 104*    Recent Results (from the past 240 hour(s))  Resp Panel by RT-PCR (Flu A&B, Covid) Nasopharyngeal Swab     Status: None   Collection Time: 06/16/21  3:30 AM   Specimen: Nasopharyngeal Swab; Nasopharyngeal(NP) swabs in vial transport medium  Result Value Ref Range Status   SARS Coronavirus 2 by RT PCR NEGATIVE NEGATIVE Final    Comment: (NOTE) SARS-CoV-2 target nucleic acids are NOT DETECTED.  The SARS-CoV-2 RNA is generally detectable in upper respiratory specimens during the acute phase of infection. The lowest concentration of SARS-CoV-2 viral copies this assay can detect is 138 copies/mL. A negative result does not preclude SARS-Cov-2 infection and should not be used as the sole basis for  treatment or other patient management decisions. A negative result may occur with  improper specimen collection/handling, submission of specimen other than nasopharyngeal swab, presence of viral mutation(s) within the areas targeted by this assay, and inadequate number of viral copies(<138 copies/mL). A negative result must be combined with clinical observations, patient history, and epidemiological information. The expected result is Negative.  Fact Sheet for Patients:  EntrepreneurPulse.com.au  Fact Sheet for Healthcare Providers:  IncredibleEmployment.be  This test is no t yet approved or cleared by the [place] FDA and  has been authorized for detection and/or diagnosis of SARS-CoV-2 by FDA under an Emergency Use Authorization (EUA). This EUA will remain  in effect (meaning this test can be used) for the duration of the COVID-19 declaration under Section 564(b)(1) of the Act, 21 U.S.C.section 360bbb-3(b)(1), unless the authorization is terminated  or revoked sooner.        Influenza A by PCR NEGATIVE NEGATIVE Final   Influenza B by PCR NEGATIVE NEGATIVE Final    Comment: (NOTE) The Xpert Xpress SARS-CoV-2/FLU/RSV plus assay is intended as an aid in the diagnosis of influenza from Nasopharyngeal swab specimens and should not be used as a sole basis for treatment. Nasal washings and aspirates are unacceptable for Xpert Xpress SARS-CoV-2/FLU/RSV testing.  Fact Sheet for Patients: EntrepreneurPulse.com.au  Fact Sheet for Healthcare Providers: IncredibleEmployment.be  This test is not yet approved or cleared by the [place] FDA and has been authorized for detection and/or diagnosis of SARS-CoV-2 by FDA under an Emergency Use Authorization (EUA). This EUA will remain in effect (meaning this test can be used) for the duration of the COVID-19 declaration under Section 564(b)(1) of the Act, 21 U.S.C. section 360bbb-3(b)(1), unless the authorization is terminated or revoked.  Performed at [facility], [address], [place], [place] [address]      Radiology Studies: US Abdomen Complete  Result Date: 06/17/2021 CLINICAL DATA:  Abnormal LFTs EXAM: ABDOMEN ULTRASOUND COMPLETE COMPARISON:  No prior abdominal ultrasound, correlation is made with 06/16/2021 renal ultrasound. FINDINGS: Gallbladder: Surgically absent Common bile duct: Diameter: 6 mm Liver: No focal lesion identified. Slightly lobular liver contour. Increased parenchymal echogenicity. Portal vein is patent on color Doppler imaging with normal direction of blood flow towards the liver. IVC: No abnormality visualized. Pancreas: The tail is obscured by overlying bowel gas. Imaged portions are unremarkable. Spleen: Size and appearance within normal limits. Right Kidney: Length: 10.5 cm. Echogenicity within normal limits. No mass or hydronephrosis visualized. Left Kidney: Length: 10.9 cm. Echogenicity within normal limits. No mass or hydronephrosis visualized. Abdominal  aorta: No aneurysm visualized. Other findings: None. IMPRESSION: Slightly lobular liver contour, suggestive of cirrhosis, with increased parenchymal echogenicity, suggestive of hepatic steatosis. Otherwise normal ultrasound of the abdomen. Electronically Signed   By: Merilyn Baba M.D.   On: 06/17/2021 11:27    Scheduled Meds:  allopurinol  100 mg Oral QHS   apixaban  5 mg Oral BID   carvedilol  6.25 mg Oral BID WC   DULoxetine  30 mg Oral Daily   ferrous sulfate  325 mg Oral Q breakfast   insulin aspart  0-9 Units Subcutaneous TID WC   loratadine  10 mg Oral QPM   pantoprazole  40 mg Oral QAC breakfast   polyethylene glycol  17 g Oral Daily   potassium chloride  30 mEq Oral BID   rosuvastatin  5 mg Oral QPM   Continuous Infusions:  lactated ringers 70 mL/hr at 06/18/21 1051   magnesium sulfate bolus IVPB  LOS: 3 days   Time spent: 35 mins   Danzig Macgregor Wynetta Emery, MD How to contact the [facility] Attending or Consulting provider [place] or covering provider during after hours [place], for this patient?  Check the care team in [facility] and look for a) attending/consulting TRH provider listed and b) the [facility] team listed Log into [email or web address] and use [place]'s universal password to access. If you do not have the password, please contact the hospital operator. Locate the [facility] provider you are looking for under [facility] and page to a number that you can be directly reached. If you still have difficulty reaching the provider, please page the [facility] (Director on Call) for the Hospitalists listed on amion for assistance.  06/19/2021, 9:25 AM

## 2021-06-19 NOTE — Plan of Care (Signed)
°  Problem: Acute Rehab PT Goals(only PT should resolve) Goal: Pt Will Go Supine/Side To Sit Outcome: Progressing Flowsheets (Taken 06/19/2021 1247) Pt will go Supine/Side to Sit: with modified independence Goal: Pt Will Go Sit To Supine/Side Outcome: Progressing Flowsheets (Taken 06/19/2021 1247) Pt will go Sit to Supine/Side: with modified independence Goal: Patient Will Transfer Sit To/From Stand Outcome: Progressing Flowsheets (Taken 06/19/2021 1247) Patient will transfer sit to/from stand: with modified independence Goal: Pt Will Transfer Bed To Chair/Chair To Bed Outcome: Progressing Flowsheets (Taken 06/19/2021 1247) Pt will Transfer Bed to Chair/Chair to Bed: with supervision Goal: Pt Will Ambulate Outcome: Progressing Flowsheets (Taken 06/19/2021 1247) Pt will Ambulate:  > 125 feet  with least restrictive assistive device  with supervision Goal: Pt Will Go Up/Down Stairs Outcome: Progressing Flowsheets (Taken 06/19/2021 1247) Pt will Go Up / Down Stairs:  3-5 stairs  with min guard assist  with least restrictive assistive device Goal: Pt/caregiver will Perform Home Exercise Program Outcome: Progressing Flowsheets (Taken 06/19/2021 1247) Pt/caregiver will Perform Home Exercise Program:  For increased strengthening  For improved balance  With Supervision, verbal cues required/provided   Floria Raveling. Hartnett-Rands, MS, PT Per [place] [phone] 06/19/2021

## 2021-06-19 NOTE — TOC Progression Note (Signed)
Transition of Care [facility]) - Progression Note    Patient Details  Name: Lauren Hamilton MRN: [id number] Date of Birth: 1945-10-20  Transition of Care [facility]) CM/SW [place], RN Phone Number: 06/19/2021, 4:29 PM  Clinical Narrative: Notified pharmacy to provide Eliquis education and coupon. TOC to continue to track for discharge needs.        Barriers to Discharge: Continued Medical Work up  Expected Discharge Plan and Services                                                 Social Determinants of Health (SDOH) Interventions    Readmission Risk Interventions No flowsheet data found.

## 2021-06-20 DIAGNOSIS — I10 Essential (primary) hypertension: Secondary | ICD-10-CM

## 2021-06-20 LAB — GLUCOSE, CAPILLARY
Glucose-Capillary: 99 mg/dL (ref 70–99)
Glucose-Capillary: 148 mg/dL — ABNORMAL HIGH (ref 70–99)
Glucose-Capillary: 97 mg/dL (ref 70–99)
Glucose-Capillary: 117 mg/dL — ABNORMAL HIGH (ref 70–99)

## 2021-06-20 LAB — RENAL FUNCTION PANEL
Albumin: 3.2 g/dL — ABNORMAL LOW (ref 3.5–5.0)
Anion gap: 10 (ref 5–15)
BUN: 73 mg/dL — ABNORMAL HIGH (ref 8–23)
CO2: 26 mmol/L (ref 22–32)
Calcium: 9.4 mg/dL (ref 8.9–10.3)
Chloride: 104 mmol/L (ref 98–111)
Creatinine, Ser: 3.83 mg/dL — ABNORMAL HIGH (ref 0.44–1.00)
GFR, Estimated: 12 mL/min — ABNORMAL LOW (ref >60)
Glucose, Bld: 101 mg/dL — ABNORMAL HIGH (ref 70–99)
Phosphorus: 3.9 mg/dL (ref 2.5–4.6)
Potassium: 3.8 mmol/L (ref 3.5–5.1)
Sodium: 140 mmol/L (ref 135–145)

## 2021-06-20 LAB — MAGNESIUM: Magnesium: 1.9 mg/dL (ref 1.7–2.4)

## 2021-06-20 MED ORDER — LORAZEPAM 2 MG/ML IJ SOLN
1.0000 mg | Freq: Once | INTRAMUSCULAR | Status: AC
  Administered 2021-06-20: 1 mg via INTRAVENOUS
  Filled 2021-06-20: qty 1

## 2021-06-20 NOTE — Progress Notes (Signed)
PROGRESS NOTE   Lauren Hamilton  GNF:[id number] DOB: [birth year] DOA: 06/16/2021 PCP: Monico Blitz, MD   Chief Complaint  Patient presents with   Dehydration   Level of care: Med-Surg  Brief Admission History:  76 y.o. female with medical history significant for stage IIIb CKD followed by Dr. 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  Patient had been complaining of progressive symptoms of dyspnea over the past month.  There has been no chest pain symptoms.  No fever or chills.  No cough.  Patient reports that she has been having hemorrhoids and had a colonoscopy in 2019 with polypectomy.  Patient reports urinary urgency but reports that she is having small amounts of urine output.  She has been followed by her nephrologist closely.  Assessment & Plan:   Principal Problem:   Acute-on-chronic kidney injury ([place]) Active Problems:   Pulmonary embolism (HCC)   Hypertension   Arthritis   Chronic rhinitis   Long term current use of anticoagulant therapy   Diabetes mellitus without complication (HCC)   Normochromic normocytic anemia   Stage 3b chronic kidney disease (CKD) (HCC)   Guaiac positive stools   Hx of colonic polyps   Elevated transaminase level   Hypokalemia   Gout   Hyperuricemia   Dyspnea   AKI on CKD stage IIIb -Appreciate nephrology consultation with Dr. Marval Regal -Gentle IV fluid hydration ordered -Renally dose medications as appropriate avoid nephrotoxic agent -Follow renal function panel -Renal ultrasound ruled out obstruction -creatinine improving daily    Dyspnea - RESOLVED -Acute pulmonary embolism has been ruled out on VQ scan -No signs or symptoms of heart failure -Supplemental oxygen as  needed -continue to encourage ambulation    Anemia in CKD -Hemoccult positive stools concerning given patient is on Xarelto -I have asked for GI consultation for assistance with management  -temporarily held Xarelto per GI for procedure -Has a history of recurrent venous thromboembolism with stopping Xarelto -Follow CBC closely -Anemia panel is reassuring -EGD w/ Dr. Jenetta Downer with multiple gastric polyps. Outpatient colonoscopy recommended -unable to restart xarelto due to GFR, started apixaban 5 mg BID on 06/19/21 per pharm D L. Trenton Gammon  Elevated LFTs -hepatitis panel negative  -GI consultation ordered liver US with findings of hepatic steatosis    Hypokalemia - TREATED  -additional K and Mg ordered 1/5, 1/6, 1/7    Metabolic acidosis - RESOLVED  -Secondary to AKI -resolved with IV fluid.   Type 2 diabetes mellitus with renal complications, uncontrolled -As evidenced by hemoglobin A1c of 8.1% -Continue SSI coverage and CBG monitoring -Added basal and prandial coverage for better glycemic control -lipid panel well controlled. CBG (last 3)  Recent Labs    06/19/21 1611 06/19/21 2100 06/20/21 0733  GLUCAP 122* 127* 99    UTI-fungal - TREATED  -Fluconazole daily x3 doses completed  -Glycemic control orders   Gout -  stable  -Continue daily allopurinol to treat hyperuricemia  DVT prophylaxis: SCDs Code Status: full  Family Communication: daughter at bedside  Disposition: anticipate DC  home with home health  Status is: Inpatient Remains inpatient appropriate because: IV fluids required for therapy   Consultants:  Nephrology GI   Procedures:  Renal US : no hydronephrosis Liver US:  hepatic steatosis   Antimicrobials:  N/a   Subjective: Pt  reports feeling much better.  No chest pain and no shortness of breath. Pt asking to go home.     Objective: Vitals:   06/19/21 1713 06/19/21 2059 06/20/21 0534 06/20/21 0841  BP: (!) 110/52 (!) 124/53 126/62 (!) 97/44   Pulse: 76 73 79   Resp:  20 20   Temp:  98.9 F (37.2 C) 98.6 F (37 C)   TempSrc:  Oral Oral   SpO2:  95% 93%   Weight:      Height:        Intake/Output Summary (Last 24 hours) at 06/20/2021 1028 Last data filed at 06/19/2021 2050 Gross per 24 hour  Intake 960 ml  Output 700 ml  Net 260 ml   Filed Weights   06/15/21 1925  Weight: 105.2 kg   Examination:  General exam: Appears calm and comfortable  Respiratory system: Clear to auscultation. Respiratory effort normal. Cardiovascular system: normal S1 & S2 heard. No JVD, murmurs, rubs, gallops or clicks. No pedal edema. Gastrointestinal system: Abdomen is nondistended, soft and nontender. No organomegaly or masses felt. Normal bowel sounds heard. Central nervous system: Alert and oriented. No focal neurological deficits. Extremities: Symmetric 5 x 5 power. Skin: No rashes, lesions or ulcers Psychiatry: Judgement and insight appear normal. Mood & affect appropriate.   Data Reviewed: I have personally reviewed following labs and imaging studies  CBC: Recent Labs  Lab 06/15/21 2056 06/17/21 0546 06/18/21 0709 06/19/21 0526  WBC 9.3 6.7 6.9 5.9  HGB 10.6* 8.8* 9.4* 8.8*  HCT 31.2* 26.6* 28.3* 26.7*  MCV 96.3 95.7 97.3 99.6  PLT 198 169 179 540    Basic Metabolic Panel: Recent Labs  Lab 06/15/21 2056 06/17/21 0546 06/18/21 0709 06/19/21 0526 06/19/21 0527 06/20/21 0516  NA 133* 135 136  --  137 140  K 2.9* 3.0* 3.4*  --  3.1* 3.8  CL 93* 97* 96*  --  101 104  CO2 20* 24 26  --  25 26  GLUCOSE 166* 111* 97  --  119* 101*  BUN 129* 118* 98*  --  87* 73*  CREATININE 5.83* 5.36* 5.08*  --  4.50* 3.83*  CALCIUM 8.8* 8.7* 8.9  --  9.0 9.4  MG 2.1 1.7 2.1 1.8  --  1.9  PHOS  --  7.8* 6.6* 5.5*  --  3.9    GFR: Estimated Creatinine Clearance: 15.8 mL/min (A) (by C-G formula based on SCr of 3.83 mg/dL (H)).  Liver Function Tests: Recent Labs  Lab 06/15/21 1306 06/15/21 2056 06/17/21 0546 06/18/21 0709  06/19/21 0526 06/20/21 0516  AST 178* 177* 123* 102* 69*  --   ALT 119* 117* 90* 85* 69*  --   ALKPHOS 90 92 69 70 64  --   BILITOT 0.7 0.9 0.8 0.6 0.6  --   PROT 7.1 7.0 5.6* 5.7* 5.5*  --   ALBUMIN 4.0 3.9 3.1* 3.3* 3.2* 3.2*    CBG: Recent Labs  Lab 06/19/21 0736 06/19/21 1139 06/19/21 1611 06/19/21 2100 06/20/21 0733  GLUCAP 104* 127* 122* 127* 99    Recent Results (from the past 240 hour(s))  Resp Panel by RT-PCR (Flu A&B, Covid) Nasopharyngeal Swab     Status: None   Collection Time: 06/16/21  3:30 AM   Specimen: Nasopharyngeal Swab; Nasopharyngeal(NP) swabs in vial transport medium  Result Value Ref Range Status   SARS Coronavirus 2 by RT PCR NEGATIVE NEGATIVE Final    Comment: (NOTE) SARS-CoV-2 target nucleic acids are NOT  DETECTED.  The SARS-CoV-2 RNA is generally detectable in upper respiratory specimens during the acute phase of infection. The lowest concentration of SARS-CoV-2 viral copies this assay can detect is 138 copies/mL. A negative result does not preclude SARS-Cov-2 infection and should not be used as the sole basis for treatment or other patient management decisions. A negative result may occur with  improper specimen collection/handling, submission of specimen other than nasopharyngeal swab, presence of viral mutation(s) within the areas targeted by this assay, and inadequate number of viral copies(<138 copies/mL). A negative result must be combined with clinical observations, patient history, and epidemiological information. The expected result is Negative.  Fact Sheet for Patients:  EntrepreneurPulse.com.au  Fact Sheet for Healthcare Providers:  IncredibleEmployment.be  This test is no t yet approved or cleared by the [place] FDA and  has been authorized for detection and/or diagnosis of SARS-CoV-2 by FDA under an Emergency Use Authorization (EUA). This EUA will remain  in effect (meaning this test  can be used) for the duration of the COVID-19 declaration under Section 564(b)(1) of the Act, 21 U.S.C.section 360bbb-3(b)(1), unless the authorization is terminated  or revoked sooner.       Influenza A by PCR NEGATIVE NEGATIVE Final   Influenza B by PCR NEGATIVE NEGATIVE Final    Comment: (NOTE) The Xpert Xpress SARS-CoV-2/FLU/RSV plus assay is intended as an aid in the diagnosis of influenza from Nasopharyngeal swab specimens and should not be used as a sole basis for treatment. Nasal washings and aspirates are unacceptable for Xpert Xpress SARS-CoV-2/FLU/RSV testing.  Fact Sheet for Patients: EntrepreneurPulse.com.au  Fact Sheet for Healthcare Providers: IncredibleEmployment.be  This test is not yet approved or cleared by the [place] FDA and has been authorized for detection and/or diagnosis of SARS-CoV-2 by FDA under an Emergency Use Authorization (EUA). This EUA will remain in effect (meaning this test can be used) for the duration of the COVID-19 declaration under Section 564(b)(1) of the Act, 21 U.S.C. section 360bbb-3(b)(1), unless the authorization is terminated or revoked.  Performed at [facility], [address], [place], [place] [address]      Radiology Studies: No results found.  Scheduled Meds:  allopurinol  100 mg Oral QHS   apixaban  5 mg Oral BID   carvedilol  6.25 mg Oral BID WC   DULoxetine  30 mg Oral Daily   ferrous sulfate  325 mg Oral Q breakfast   insulin aspart  0-9 Units Subcutaneous TID WC   loratadine  10 mg Oral QPM   pantoprazole  40 mg Oral QAC breakfast   polyethylene glycol  17 g Oral Daily   rosuvastatin  5 mg Oral QPM   Continuous Infusions:  lactated ringers 70 mL/hr at 06/19/21 2037    LOS: 4 days   Time spent: 35 mins   Sharry Beining Wynetta Emery, MD How to contact the [facility] Attending or Consulting provider [place] or covering provider during after hours [place], for this patient?  Check the  care team in [facility] and look for a) attending/consulting [facility] provider listed and b) the [facility] team listed Log into [email or web address] and use [facility]'s universal password to access. If you do not have the password, please contact the hospital operator. Locate the [facility] provider you are looking for under [facility] and page to a number that you can be directly reached. If you still have difficulty reaching the provider, please page the [facility] (Director on Call) for the Hospitalists listed on amion for  assistance.  06/20/2021, 10:28 AM

## 2021-06-20 NOTE — Plan of Care (Signed)
  Problem: Health Behavior/Discharge Planning: Goal: Ability to manage health-related needs will improve Outcome: Progressing   Problem: Clinical Measurements: Goal: Ability to maintain clinical measurements within normal limits will improve Outcome: Progressing   

## 2021-06-20 NOTE — Progress Notes (Signed)
Nephrology Progress Note:    Patient ID: Lauren Hamilton, female   DOB: 14-Apr-1946, 76 y.o.   MRN: [id number]  S: she had 1 liter UOP over 1/7.  She has been on LR at 70 ml/hr.  Her granddaughter is at bedside and we called her daughter on speakerphone to update her.  She states that she has a follow-up appt with her nephrologist Dr. Theador Hawthorne on 1/16.  Review of systems:  Denies shortness of breath or chest pain  Denies n/v States she walked around the hall   O:BP (!) 126/55    Pulse 77    Temp 98.4 F (36.9 C) (Oral)    Resp 18    Ht 5\' 6"  (1.676 m)    Wt 105.2 kg    SpO2 94%    BMI 37.45 kg/m   Intake/Output Summary (Last 24 hours) at 06/20/2021 1850 Last data filed at 06/20/2021 1700 Gross per 24 hour  Intake 800 ml  Output 400 ml  Net 400 ml   Intake/Output: I/O last 3 completed shifts: In: 960 [P.O.:400; I.V.:560] Out: 2200 [Urine:2200]  Intake/Output this shift:  Total I/O In: 800 [P.O.:800] Out: -  Weight change:     Recent Labs  Lab 06/15/21 1306 06/15/21 2056 06/17/21 0546 06/18/21 0709 06/19/21 0526 06/19/21 0527 06/20/21 0516  NA 135 133* 135 136  --  137 140  K 3.4* 2.9* 3.0* 3.4*  --  3.1* 3.8  CL 95* 93* 97* 96*  --  101 104  CO2 22 20* 24 26  --  25 26  GLUCOSE 128* 166* 111* 97  --  119* 101*  BUN 121* 129* 118* 98*  --  87* 73*  CREATININE 5.87* 5.83* 5.36* 5.08*  --  4.50* 3.83*  ALBUMIN 4.0 3.9 3.1* 3.3* 3.2*  --  3.2*  CALCIUM 9.0 8.8* 8.7* 8.9  --  9.0 9.4  PHOS  --   --  7.8* 6.6* 5.5*  --  3.9  AST 178* 177* 123* 102* 69*  --   --   ALT 119* 117* 90* 85* 69*  --   --    Liver Function Tests: Recent Labs  Lab 06/17/21 0546 06/18/21 0709 06/19/21 0526 06/20/21 0516  AST 123* 102* 69*  --   ALT 90* 85* 69*  --   ALKPHOS 69 70 64  --   BILITOT 0.8 0.6 0.6  --   PROT 5.6* 5.7* 5.5*  --   ALBUMIN 3.1* 3.3* 3.2* 3.2*   No results for input(s): LIPASE, AMYLASE in the last 168 hours. No results for input(s): AMMONIA in the last 168  hours. CBC: Recent Labs  Lab 06/15/21 2056 06/17/21 0546 06/18/21 0709 06/19/21 0526  WBC 9.3 6.7 6.9 5.9  HGB 10.6* 8.8* 9.4* 8.8*  HCT 31.2* 26.6* 28.3* 26.7*  MCV 96.3 95.7 97.3 99.6  PLT 198 169 179 168   Cardiac Enzymes: No results for input(s): CKTOTAL, CKMB, CKMBINDEX, TROPONINI in the last 168 hours. CBG: Recent Labs  Lab 06/19/21 1611 06/19/21 2100 06/20/21 0733 06/20/21 1207 06/20/21 1631  GLUCAP 122* 127* 99 148* 97    Iron Studies:  No results for input(s): IRON, TIBC, TRANSFERRIN, FERRITIN in the last 72 hours.  Studies/Results: No results found.  allopurinol  100 mg Oral QHS   apixaban  5 mg Oral BID   carvedilol  6.25 mg Oral BID WC   DULoxetine  30 mg Oral Daily   ferrous sulfate  325 mg Oral Q  breakfast   insulin aspart  0-9 Units Subcutaneous TID WC   loratadine  10 mg Oral QPM   pantoprazole  40 mg Oral QAC breakfast   polyethylene glycol  17 g Oral Daily   rosuvastatin  5 mg Oral QPM    BMET    Component Value Date/Time   NA 140 06/20/2021 0516   K 3.8 06/20/2021 0516   CL 104 06/20/2021 0516   CO2 26 06/20/2021 0516   GLUCOSE 101 (H) 06/20/2021 0516   BUN 73 (H) 06/20/2021 0516   CREATININE 3.83 (H) 06/20/2021 0516   CREATININE 1.27 (H) 03/11/2020 0930   CALCIUM 9.4 06/20/2021 0516   GFRNONAA 12 (L) 06/20/2021 0516   GFRNONAA 42 (L) 03/11/2020 0930   GFRAA 48 (L) 03/11/2020 0930   CBC    Component Value Date/Time   WBC 5.9 06/19/2021 0526   RBC 2.68 (L) 06/19/2021 0526   HGB 8.8 (L) 06/19/2021 0526   HCT 26.7 (L) 06/19/2021 0526   PLT 168 06/19/2021 0526   MCV 99.6 06/19/2021 0526   MCH 32.8 06/19/2021 0526   MCHC 33.0 06/19/2021 0526   RDW 17.1 (H) 06/19/2021 0526   LYMPHSABS 2.2 10/31/2019 1250   MONOABS 0.5 10/31/2019 1250   EOSABS 0.5 10/31/2019 1250   BASOSABS 0.1 10/31/2019 1250      Assessment/Plan:  Subacute-AKI/CKD stage IIIb - pre-renal and ischemic insults.  Hx heavy NSAID use in the past as well as  obesity, DM, and HTN.  Cr 1.3-1.5 until 02/05/21 when it rose to 1.91 and has had a more recent baseline of 1.7-1.9 until 05/13/21 (after starting valsartan) rose to 3.12.  No hydro  Continue LR   Serologies for acute GN negative (dsDNA, ANA, ASO, GBM), normal complements, Urine IFE unremarkable Check post void residual bladder scan and in/out cath if needed Hold valsartan Note chlorthalidone, lasix, and spironolactone are also listed as home medications - would hold all of these for now and reassess volume status per outpatient nephrologist  SOB and DOE - V/Q scan negative for PE.  BNP mildly elevated at 147--> 63.  Possibly due to underlying lung disease as she is not overtly volume overloaded and CXR is clear. Anemia - with heme + stools.  Likely GIB from recent prednisone taper.  Follow H/H and transfuse as needed.  EGD per GI Abnormal LFT's -  hepatitis panel negative.  [place] of liver suggestive of cirrhosis.  GI consulted and Abd [place] with possible hepatic steatosis Hypokalemia - improved Hyponatremia - possibly related to AKI. resolved with IVF's.  Dispo - if continued improvement would be stable for discharge tomorrow from a nephrology standpoint. Discussed with attending earlier today.  She should follow up with her nephrologist Dr. Theador Hawthorne on 1/16 as scheduled.   Claudia Desanctis, MD 7:24 PM 06/20/2021

## 2021-06-20 NOTE — Plan of Care (Signed)
  Problem: Health Behavior/Discharge Planning: Goal: Ability to manage health-related needs will improve Outcome: Progressing   Problem: Clinical Measurements: Goal: Will remain free from infection Outcome: Progressing Goal: Diagnostic test results will improve Outcome: Progressing   

## 2021-06-20 NOTE — Discharge Instructions (Signed)

## 2021-06-21 ENCOUNTER — Encounter (HOSPITAL_COMMUNITY): Payer: Self-pay | Admitting: Gastroenterology

## 2021-06-21 ENCOUNTER — Encounter: Payer: Self-pay | Admitting: Internal Medicine

## 2021-06-21 LAB — RENAL FUNCTION PANEL
Albumin: 3.1 g/dL — ABNORMAL LOW (ref 3.5–5.0)
Anion gap: 10 (ref 5–15)
BUN: 62 mg/dL — ABNORMAL HIGH (ref 8–23)
CO2: 27 mmol/L (ref 22–32)
Calcium: 9.2 mg/dL (ref 8.9–10.3)
Chloride: 102 mmol/L (ref 98–111)
Creatinine, Ser: 3.41 mg/dL — ABNORMAL HIGH (ref 0.44–1.00)
GFR, Estimated: 14 mL/min — ABNORMAL LOW (ref >60)
Glucose, Bld: 99 mg/dL (ref 70–99)
Phosphorus: 3.9 mg/dL (ref 2.5–4.6)
Potassium: 3.4 mmol/L — ABNORMAL LOW (ref 3.5–5.1)
Sodium: 139 mmol/L (ref 135–145)

## 2021-06-21 LAB — SURGICAL PATHOLOGY

## 2021-06-21 LAB — GLUCOSE, CAPILLARY
Glucose-Capillary: 94 mg/dL (ref 70–99)
Glucose-Capillary: 128 mg/dL — ABNORMAL HIGH (ref 70–99)

## 2021-06-21 MED ORDER — APIXABAN 5 MG PO TABS: 5.0000 mg | ORAL_TABLET | Freq: Two times a day (BID) | ORAL | 1 refills | Status: DC

## 2021-06-21 MED ORDER — ROSUVASTATIN CALCIUM 10 MG PO TABS: 10.0000 mg | ORAL_TABLET | Freq: Every evening | ORAL | 1 refills | Status: AC

## 2021-06-21 MED ORDER — POTASSIUM CHLORIDE CRYS ER 20 MEQ PO TBCR
40.0000 meq | EXTENDED_RELEASE_TABLET | Freq: Once | ORAL | Status: AC
Start: 2021-06-21 — End: 2021-06-21
  Administered 2021-06-21: 40 meq via ORAL
  Filled 2021-06-21: qty 2

## 2021-06-21 MED ORDER — CARVEDILOL 6.25 MG PO TABS: 6.2500 mg | ORAL_TABLET | Freq: Two times a day (BID) | ORAL | 1 refills | Status: DC

## 2021-06-21 MED ORDER — LINAGLIPTIN 5 MG PO TABS
5.0000 mg | ORAL_TABLET | Freq: Every day | ORAL | 1 refills | Status: DC
Start: 2021-06-21 — End: 2024-03-28

## 2021-06-21 NOTE — Progress Notes (Signed)
Pt discharged via wheelchair to POV with belongings in her possession.

## 2021-06-21 NOTE — Discharge Summary (Addendum)
Physician Discharge Summary  [name]:[id number] DOB: March 24, 1946 DOA: 06/16/2021  PCP: Monico Blitz, MD Nephrologist: Dr. Theador Hawthorne  GI: Dr. Gala Romney  Admit date: 06/16/2021 Discharge date: 06/21/2021  Admitted From:  Home  Disposition: Home   Recommendations for Outpatient Follow-up:  Follow up with nephrologist on 06/28/21 Follow up with PCP in 2 weeks  Please obtain BMP/CBC in 1-2 weeks  Discharge Condition: STABLE   CODE STATUS: FULL  DIET: heart healthy recommended   Brief Hospitalization Summary: Please see all hospital notes, images, labs for full details of the hospitalization. ADMISSION HPI:  Lauren Hamilton is a 76 y.o. female with medical history significant for stage IIIb CKD followed by Dr. 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  Patient had been complaining of progressive symptoms of dyspnea over the past month.  There has been no chest pain symptoms.  No fever or chills.  No cough.  Patient reports that she has been having hemorrhoids and had a colonoscopy in 2019 with polypectomy.  Patient reports urinary urgency but reports that she is having small amounts of urine output.  She has been followed by her nephrologist closely.   ED Course: Patient was afebrile with a temperature of 97.7, pulse 78, blood pressure 122/64, pulse ox 96% on room air.  Sodium 133, potassium 2.9, CO2 20, glucose 166, BUN 129, creatinine 5.83, calcium 8.8, anion gap 20.  AST 177, ALT 117.  Cardiac BNP 147.0.  Hemoglobin 10.6, platelet count 198, hemoglobin A1c 8.1%.  Nuclear medicine scan negative for pulmonary embolus.  SARS 2 coronavirus test negative.  Influenza testing negative.  Portable chest x-ray no acute findings.  Antibody  screen A negative.  Urinalysis with budding yeast seen and 21-50 WBC per high-power field and uric acid crystals seen.  Patient tested guaiac positive.  Hospital admission requested for AKI on CKD stage IIIb and GI bleed.  HOSPITAL COURSE  BY PROBLEM LIST    AKI on CKD stage IIIb -Appreciate nephrology consultation with Dr. Marval Regal -Gentle IV fluid hydration ordered -Renally dose medications as appropriate avoid nephrotoxic agent -Follow renal function panel -Renal ultrasound ruled out obstruction -creatinine improving daily    Dyspnea - RESOLVED -Acute pulmonary embolism has been ruled out on VQ scan -No signs or symptoms of heart failure -Supplemental oxygen as needed -continue to encourage ambulation    Anemia in CKD -Hemoccult positive stools concerning given patient is on Xarelto -I have asked for GI consultation for assistance with management  -temporarily held Xarelto per GI for procedure -Has a history of recurrent venous thromboembolism with stopping Xarelto -Follow CBC closely -Anemia panel is reassuring -EGD w/ Dr. Jenetta Downer with multiple gastric polyps. Outpatient colonoscopy recommended -unable to restart xarelto due to GFR, started apixaban 5 mg BID on 06/19/21 per pharm D L. Trenton Gammon  Elevated LFTs -hepatitis panel negative  -GI consultation ordered liver US with findings of hepatic steatosis    Hypokalemia - TREATED  -additional K and Mg ordered 1/5, 1/6, 1/7    Metabolic acidosis - RESOLVED  -Secondary to AKI -resolved with IV fluid.   Type 2 diabetes mellitus with renal complications, uncontrolled -As evidenced by hemoglobin A1c of 8.1% -DC home on tradjenta 5 mg daily.  Follow up with PCP.   -lipid panel well controlled. CBG (last 3)  Recent Labs    06/20/21 2128 06/21/21 0739 06/21/21 1125  GLUCAP 117* 94 128*    UTI-fungal - TREATED  -Fluconazole daily x3 doses completed  -Glycemic control orders   Gout -  stable  -Continue daily allopurinol  to treat hyperuricemia   DVT prophylaxis: SCDs Code Status: full  Family Communication: daughter at bedside  Disposition: home with home health   Discharge Diagnoses:  Principal Problem:   Acute-on-chronic kidney injury ([place]) Active Problems:   Pulmonary embolism ([place])   Hypertension   Arthritis   Chronic rhinitis   Long term current use of anticoagulant therapy   Diabetes mellitus without complication (HCC)   Normochromic normocytic anemia   Stage 3b chronic kidney disease (CKD) (HCC)   Guaiac positive stools   Hx of colonic polyps   Elevated transaminase level   Hypokalemia   Gout   Hyperuricemia   Dyspnea   Discharge Instructions:  Allergies as of 06/21/2021       Reactions   Other Itching   Unknown reaction   Latex Rash   Reaction is only with internal contact Reaction is only with internal contact   Clonidine Derivatives    Dizzy, dry mouth   Etodolac    Unknown reaction   Hydralazine    Hydrocodone Other (See Comments)   Altered mental status-Hallucination, tolerated in low doses    Naproxen Other (See Comments)   Damage to kidney   Statins Other (See Comments)   Unknown reaction, tolerates lovastatin    Tramadol    Patient, "Felt funny, didn't feel right, and felt weird."   Amlodipine Cough   Lisinopril Cough   Losartan Cough        Medication List     STOP taking these medications    chlorthalidone 25 MG tablet Commonly known as: HYGROTON   colchicine-probenecid 0.5-500 MG tablet   furosemide 20 MG tablet Commonly known as: LASIX   potassium chloride 20 MEQ/15ML (10%) Soln   rivaroxaban 20 MG Tabs tablet Commonly known as: Xarelto   spironolactone 25 MG tablet Commonly known as: ALDACTONE       TAKE these medications    allopurinol 100 MG tablet Commonly known as: ZYLOPRIM Take 300 mg by mouth at bedtime.   apixaban 5 MG Tabs tablet Commonly known as: ELIQUIS Take 1 tablet (5 mg total) by mouth 2 (two) times daily.    carvedilol 6.25 MG tablet Commonly known as: COREG Take 1 tablet (6.25 mg total) by mouth 2 (two) times daily with a meal. What changed:  medication strength how much to take   ferrous sulfate 325 (65 FE) MG tablet Take 325 mg by mouth daily.   levocetirizine 5 MG tablet Commonly known as: XYZAL Take 5 mg by mouth every evening.   linagliptin 5 MG Tabs tablet Commonly known as: Tradjenta Take 1 tablet (5 mg total) by mouth daily.   MAGNESIUM PO Take 1 tablet by mouth daily.   omeprazole 40 MG capsule Commonly known as: PRILOSEC Take 40 mg by mouth daily.   rosuvastatin 10 MG tablet Commonly known as: CRESTOR Take 1 tablet (10 mg total) by mouth every evening. What changed:  medication strength how much to take when to take this   Soothe XP Soln Apply 1-2 drops to eye as needed (dry eyes).   SUMAtriptan 100 MG tablet Commonly known as: IMITREX Take 1 tablet by mouth every 2 (two) hours as needed for migraine.   VITAMIN C PO Take 1 tablet by  mouth daily.   vitamin E 180 MG (400 UNITS) capsule Take 400 Units by mouth daily.        Follow-up Information     Liana Gerold, MD. Go in 1 week(s).   Specialty: Nephrology Why: Hospital Follow Up Contact information: [address] [place] [address] [phone]         Monico Blitz, MD. Schedule an appointment as soon as possible for a visit in 2 week(s).   Specialty: Internal Medicine Why: Hospital Follow Up Contact information: [place] [address] [phone]         Care, [facility] Follow up.   Specialty: Home Health Services Why: PT will call to schedule your first home visit. Contact information: [address] [place] [address] [phone]         [name], MD. Schedule an appointment as soon as possible for a visit in 1 month(s).   Specialty: Gastroenterology Why: Hospital Follow Up, need colonoscopy Contact  information: [place] [address] [phone]                Allergies  Allergen Reactions   Other Itching    Unknown reaction   Latex Rash    Reaction is only with internal contact Reaction is only with internal contact   Clonidine Derivatives     Dizzy, dry mouth   Etodolac     Unknown reaction   Hydralazine    Hydrocodone Other (See Comments)    Altered mental status-Hallucination, tolerated in low doses    Naproxen Other (See Comments)    Damage to kidney    Statins Other (See Comments)    Unknown reaction, tolerates lovastatin    Tramadol     Patient, "Felt funny, didn't feel right, and felt weird."   Amlodipine Cough   Lisinopril Cough   Losartan Cough   Allergies as of 06/21/2021       Reactions   Other Itching   Unknown reaction   Latex Rash   Reaction is only with internal contact Reaction is only with internal contact   Clonidine Derivatives    Dizzy, dry mouth   Etodolac    Unknown reaction   Hydralazine    Hydrocodone Other (See Comments)   Altered mental status-Hallucination, tolerated in low doses    Naproxen Other (See Comments)   Damage to kidney   Statins Other (See Comments)   Unknown reaction, tolerates lovastatin    Tramadol    Patient, "Felt funny, didn't feel right, and felt weird."   Amlodipine Cough   Lisinopril Cough   Losartan Cough        Medication List     STOP taking these medications    chlorthalidone 25 MG tablet Commonly known as: HYGROTON   colchicine-probenecid 0.5-500 MG tablet   furosemide 20 MG tablet Commonly known as: LASIX   potassium chloride 20 MEQ/15ML (10%) Soln   rivaroxaban 20 MG Tabs tablet Commonly known as: Xarelto   spironolactone 25 MG tablet Commonly known as: ALDACTONE       TAKE these medications    allopurinol 100 MG tablet Commonly known as: ZYLOPRIM Take 300 mg by mouth at bedtime.   apixaban 5 MG Tabs tablet Commonly known as: ELIQUIS Take 1  tablet (5 mg total) by mouth 2 (two) times daily.   carvedilol 6.25 MG tablet Commonly known as: COREG Take 1 tablet (6.25 mg total) by mouth 2 (two) times  daily with a meal. What changed:  medication strength how much to take   ferrous sulfate 325 (65 FE) MG tablet Take 325 mg by mouth daily.   levocetirizine 5 MG tablet Commonly known as: XYZAL Take 5 mg by mouth every evening.   linagliptin 5 MG Tabs tablet Commonly known as: Tradjenta Take 1 tablet (5 mg total) by mouth daily.   MAGNESIUM PO Take 1 tablet by mouth daily.   omeprazole 40 MG capsule Commonly known as: PRILOSEC Take 40 mg by mouth daily.   rosuvastatin 10 MG tablet Commonly known as: CRESTOR Take 1 tablet (10 mg total) by mouth every evening. What changed:  medication strength how much to take when to take this   Soothe XP Soln Apply 1-2 drops to eye as needed (dry eyes).   SUMAtriptan 100 MG tablet Commonly known as: IMITREX Take 1 tablet by mouth every 2 (two) hours as needed for migraine.   VITAMIN C PO Take 1 tablet by mouth daily.   vitamin E 180 MG (400 UNITS) capsule Take 400 Units by mouth daily.        Procedures/Studies: NM Pulmonary Perfusion  Result Date: 06/16/2021 CLINICAL DATA:  Coursing shortness of breath for 10 days. EXAM: NUCLEAR MEDICINE PERFUSION LUNG SCAN TECHNIQUE: Perfusion images were obtained in multiple projections after intravenous injection of radiopharmaceutical. Ventilation scans intentionally deferred if perfusion scan and chest x-ray adequate for interpretation during COVID 19 epidemic. RADIOPHARMACEUTICALS:  4.4 mCi Tc-20m MAA IV COMPARISON:  Chest x-ray performed same day. FINDINGS: No peripheral wedge-shaped perfusion defect identified in either lung. IMPRESSION: Negative for pulmonary embolus. Electronically Signed   By: Misty Stanley M.D.   On: 06/16/2021 07:46   US Abdomen Complete  Result Date: 06/17/2021 CLINICAL DATA:  Abnormal LFTs EXAM: ABDOMEN  ULTRASOUND COMPLETE COMPARISON:  No prior abdominal ultrasound, correlation is made with 06/16/2021 renal ultrasound. FINDINGS: Gallbladder: Surgically absent Common bile duct: Diameter: 6 mm Liver: No focal lesion identified. Slightly lobular liver contour. Increased parenchymal echogenicity. Portal vein is patent on color Doppler imaging with normal direction of blood flow towards the liver. IVC: No abnormality visualized. Pancreas: The tail is obscured by overlying bowel gas. Imaged portions are unremarkable. Spleen: Size and appearance within normal limits. Right Kidney: Length: 10.5 cm. Echogenicity within normal limits. No mass or hydronephrosis visualized. Left Kidney: Length: 10.9 cm. Echogenicity within normal limits. No mass or hydronephrosis visualized. Abdominal aorta: No aneurysm visualized. Other findings: None. IMPRESSION: Slightly lobular liver contour, suggestive of cirrhosis, with increased parenchymal echogenicity, suggestive of hepatic steatosis. Otherwise normal ultrasound of the abdomen. Electronically Signed   By: Merilyn Baba M.D.   On: 06/17/2021 11:27   US RENAL  Result Date: 06/16/2021 CLINICAL DATA:  Renal dysfunction EXAM: RENAL / URINARY TRACT ULTRASOUND COMPLETE COMPARISON:  01/07/2019 FINDINGS: Right Kidney: Renal measurements: 10.3 x 5 x 5.8 cm = volume: 154.7 mL. Echogenicity within normal limits. No mass or hydronephrosis visualized. Left Kidney: Renal measurements: 10.5 x 5.8 x 5.3 cm = volume: 170 mL. Echogenicity within normal limits. No mass or hydronephrosis visualized. Bladder: Appears normal for degree of bladder distention. Other: There is possible 1.6 cm splenule adjacent to the spleen. IMPRESSION: There is no hydronephrosis. Electronically Signed   By: Elmer Picker M.D.   On: 06/16/2021 11:51   DG Chest Port 1 View  Result Date: 06/16/2021 CLINICAL DATA:  Dyspnea EXAM: PORTABLE CHEST 1 VIEW COMPARISON:  10/31/2019 FINDINGS: Lungs are well expanded,  symmetric, and clear. No pneumothorax  or pleural effusion. Cardiac size within normal limits. Pulmonary vascularity is normal. Osseous structures are age-appropriate. No acute bone abnormality. Surgical clips noted at the left neck base. IMPRESSION: No active disease. Electronically Signed   By: Fidela Salisbury M.D.   On: 06/16/2021 03:05     Subjective: Pt reports feeling much better and asking to go home.    Discharge Exam: Vitals:   06/21/21 0606 06/21/21 0800  BP: 122/79 119/64  Pulse: 77 74  Resp: 19 18  Temp: 98.8 F (37.1 C) 98.1 F (36.7 C)  SpO2: 96% 95%   Vitals:   06/20/21 1729 06/20/21 2126 06/21/21 0606 06/21/21 0800  BP: (!) 126/55 (!) 148/71 122/79 119/64  Pulse: 77 76 77 74  Resp:  20 19 18   Temp:  98.7 F (37.1 C) 98.8 F (37.1 C) 98.1 F (36.7 C)  TempSrc:    Oral  SpO2:  98% 96% 95%  Weight:      Height:       General: Pt is alert, awake, not in acute distress Cardiovascular: RRR, S1/S2 +, no rubs, no gallops Respiratory: CTA bilaterally, no wheezing, no rhonchi Abdominal: Soft, NT, ND, bowel sounds + Extremities: no edema, no cyanosis   The results of significant diagnostics from this hospitalization (including imaging, microbiology, ancillary and laboratory) are listed below for reference.     Microbiology: Recent Results (from the past 240 hour(s))  Resp Panel by RT-PCR (Flu A&B, Covid) Nasopharyngeal Swab     Status: None   Collection Time: 06/16/21  3:30 AM   Specimen: Nasopharyngeal Swab; Nasopharyngeal(NP) swabs in vial transport medium  Result Value Ref Range Status   SARS Coronavirus 2 by RT PCR NEGATIVE NEGATIVE Final    Comment: (NOTE) SARS-CoV-2 target nucleic acids are NOT DETECTED.  The SARS-CoV-2 RNA is generally detectable in upper respiratory specimens during the acute phase of infection. The lowest concentration of SARS-CoV-2 viral copies this assay can detect is 138 copies/mL. A negative result does not preclude  SARS-Cov-2 infection and should not be used as the sole basis for treatment or other patient management decisions. A negative result may occur with  improper specimen collection/handling, submission of specimen other than nasopharyngeal swab, presence of viral mutation(s) within the areas targeted by this assay, and inadequate number of viral copies(<138 copies/mL). A negative result must be combined with clinical observations, patient history, and epidemiological information. The expected result is Negative.  Fact Sheet for Patients:  EntrepreneurPulse.com.au  Fact Sheet for Healthcare Providers:  IncredibleEmployment.be  This test is no t yet approved or cleared by the [place] FDA and  has been authorized for detection and/or diagnosis of SARS-CoV-2 by FDA under an Emergency Use Authorization (EUA). This EUA will remain  in effect (meaning this test can be used) for the duration of the COVID-19 declaration under Section 564(b)(1) of the Act, 21 U.S.C.section 360bbb-3(b)(1), unless the authorization is terminated  or revoked sooner.       Influenza A by PCR NEGATIVE NEGATIVE Final   Influenza B by PCR NEGATIVE NEGATIVE Final    Comment: (NOTE) The Xpert Xpress SARS-CoV-2/FLU/RSV plus assay is intended as an aid in the diagnosis of influenza from Nasopharyngeal swab specimens and should not be used as a sole basis for treatment. Nasal washings and aspirates are unacceptable for Xpert Xpress SARS-CoV-2/FLU/RSV testing.  Fact Sheet for Patients: EntrepreneurPulse.com.au  Fact Sheet for Healthcare Providers: IncredibleEmployment.be  This test is not yet approved or cleared by the [place] and  has been authorized for detection and/or diagnosis of SARS-CoV-2 by FDA under an Emergency Use Authorization (EUA). This EUA will remain in effect (meaning this test can be used) for the duration of  the COVID-19 declaration under Section 564(b)(1) of the Act, 21 U.S.C. section 360bbb-3(b)(1), unless the authorization is terminated or revoked.  Performed at [facility], [address], [place], [place] [address]      Labs: BNP (last 3 results) Recent Labs    06/15/21 2056 06/17/21 0546  BNP 147.0* 71.2   Basic Metabolic Panel: Recent Labs  Lab 06/15/21 2056 06/17/21 0546 06/18/21 0709 06/19/21 0526 06/19/21 0527 06/20/21 0516 06/21/21 0537  NA 133* 135 136  --  137 140 139  K 2.9* 3.0* 3.4*  --  3.1* 3.8 3.4*  CL 93* 97* 96*  --  101 104 102  CO2 20* 24 26  --  25 26 27   GLUCOSE 166* 111* 97  --  119* 101* 99  BUN 129* 118* 98*  --  87* 73* 62*  CREATININE 5.83* 5.36* 5.08*  --  4.50* 3.83* 3.41*  CALCIUM 8.8* 8.7* 8.9  --  9.0 9.4 9.2  MG 2.1 1.7 2.1 1.8  --  1.9  --   PHOS  --  7.8* 6.6* 5.5*  --  3.9 3.9   Liver Function Tests: Recent Labs  Lab 06/15/21 1306 06/15/21 2056 06/17/21 0546 06/18/21 0709 06/19/21 0526 06/20/21 0516 06/21/21 0537  AST 178* 177* 123* 102* 69*  --   --   ALT 119* 117* 90* 85* 69*  --   --   ALKPHOS 90 92 69 70 64  --   --   BILITOT 0.7 0.9 0.8 0.6 0.6  --   --   PROT 7.1 7.0 5.6* 5.7* 5.5*  --   --   ALBUMIN 4.0 3.9 3.1* 3.3* 3.2* 3.2* 3.1*   No results for input(s): LIPASE, AMYLASE in the last 168 hours. No results for input(s): AMMONIA in the last 168 hours. CBC: Recent Labs  Lab 06/15/21 2056 06/17/21 0546 06/18/21 0709 06/19/21 0526  WBC 9.3 6.7 6.9 5.9  HGB 10.6* 8.8* 9.4* 8.8*  HCT 31.2* 26.6* 28.3* 26.7*  MCV 96.3 95.7 97.3 99.6  PLT 198 169 179 168   Cardiac Enzymes: No results for input(s): CKTOTAL, CKMB, CKMBINDEX, TROPONINI in the last 168 hours. BNP: Invalid input(s): POCBNP CBG: Recent Labs  Lab 06/20/21 1207 06/20/21 1631 06/20/21 2128 06/21/21 0739 06/21/21 1125  GLUCAP 148* 97 117* 94 128*   D-Dimer No results for input(s): DDIMER in the last 72 hours. Hgb A1c No results for  input(s): HGBA1C in the last 72 hours. Lipid Profile No results for input(s): CHOL, HDL, LDLCALC, TRIG, CHOLHDL, LDLDIRECT in the last 72 hours. Thyroid function studies No results for input(s): TSH, T4TOTAL, T3FREE, THYROIDAB in the last 72 hours.  Invalid input(s): FREET3 Anemia work up No results for input(s): VITAMINB12, FOLATE, FERRITIN, TIBC, IRON, RETICCTPCT in the last 72 hours. Urinalysis    Component Value Date/Time   COLORURINE YELLOW 06/16/2021 1635   APPEARANCEUR CLEAR 06/16/2021 1635   LABSPEC 1.009 06/16/2021 1635   PHURINE 5.0 06/16/2021 1635   GLUCOSEU NEGATIVE 06/16/2021 1635   HGBUR LARGE (A) 06/16/2021 1635   BILIRUBINUR NEGATIVE 06/16/2021 1635   KETONESUR NEGATIVE 06/16/2021 1635   PROTEINUR 30 (A) 06/16/2021 1635   NITRITE NEGATIVE 06/16/2021 1635   LEUKOCYTESUR NEGATIVE 06/16/2021 1635   Sepsis Labs Invalid input(s): PROCALCITONIN,  WBC,  LACTICIDVEN Microbiology  Recent Results (from the past 240 hour(s))  Resp Panel by RT-PCR (Flu A&B, Covid) Nasopharyngeal Swab     Status: None   Collection Time: 06/16/21  3:30 AM   Specimen: Nasopharyngeal Swab; Nasopharyngeal(NP) swabs in vial transport medium  Result Value Ref Range Status   SARS Coronavirus 2 by RT PCR NEGATIVE NEGATIVE Final    Comment: (NOTE) SARS-CoV-2 target nucleic acids are NOT DETECTED.  The SARS-CoV-2 RNA is generally detectable in upper respiratory specimens during the acute phase of infection. The lowest concentration of SARS-CoV-2 viral copies this assay can detect is 138 copies/mL. A negative result does not preclude SARS-Cov-2 infection and should not be used as the sole basis for treatment or other patient management decisions. A negative result may occur with  improper specimen collection/handling, submission of specimen other than nasopharyngeal swab, presence of viral mutation(s) within the areas targeted by this assay, and inadequate number of viral copies(<138 copies/mL). A  negative result must be combined with clinical observations, patient history, and epidemiological information. The expected result is Negative.  Fact Sheet for Patients:  EntrepreneurPulse.com.au  Fact Sheet for Healthcare Providers:  IncredibleEmployment.be  This test is no t yet approved or cleared by the [place] FDA and  has been authorized for detection and/or diagnosis of SARS-CoV-2 by FDA under an Emergency Use Authorization (EUA). This EUA will remain  in effect (meaning this test can be used) for the duration of the COVID-19 declaration under Section 564(b)(1) of the Act, 21 U.S.C.section 360bbb-3(b)(1), unless the authorization is terminated  or revoked sooner.       Influenza A by PCR NEGATIVE NEGATIVE Final   Influenza B by PCR NEGATIVE NEGATIVE Final    Comment: (NOTE) The Xpert Xpress SARS-CoV-2/FLU/RSV plus assay is intended as an aid in the diagnosis of influenza from Nasopharyngeal swab specimens and should not be used as a sole basis for treatment. Nasal washings and aspirates are unacceptable for Xpert Xpress SARS-CoV-2/FLU/RSV testing.  Fact Sheet for Patients: EntrepreneurPulse.com.au  Fact Sheet for Healthcare Providers: IncredibleEmployment.be  This test is not yet approved or cleared by the [place] FDA and has been authorized for detection and/or diagnosis of SARS-CoV-2 by FDA under an Emergency Use Authorization (EUA). This EUA will remain in effect (meaning this test can be used) for the duration of the COVID-19 declaration under Section 564(b)(1) of the Act, 21 U.S.C. section 360bbb-3(b)(1), unless the authorization is terminated or revoked.  Performed at [facility], [address] [place], [place] [address]     Time coordinating discharge: 45 mins   SIGNED:  Irwin Brakeman, MD  [facility] 06/21/2021, 12:09 PM How to contact the [facility] Attending or  Consulting provider [place] or covering provider during after hours [place], for this patient?  Check the care team in [facility] and look for a) attending/consulting [facility] provider listed and b) the [facility] team listed Log into [email or web address] and use [place]'s universal password to access. If you do not have the password, please contact the hospital operator. Locate the [facility] provider you are looking for under [facility] and page to a number that you can be directly reached. If you still have difficulty reaching the provider, please page the [facility] (Director on Call) for the Hospitalists listed on amion for assistance.

## 2021-06-21 NOTE — TOC Transition Note (Signed)
Transition of Care [facility]) - CM/SW Discharge Note   Patient Details  Name: CHERLY ERNO MRN: [id number] Date of Birth: [birth year]  Transition of Care [facility]) CM/SW Contact:  Boneta Lucks, RN Phone Number: 06/21/2021, 11:57 AM   Clinical Narrative:  Patient is discharging home today. Needing TOC to set up home health. Georgina Snell with [facility] accepted the referral for HHPT. Added to AVS  Final next level of care: [place] Barriers to Discharge: Barriers Resolved  Patient Goals and CMS Choice Patient states their goals for this hospitalization and ongoing recovery are:: to go home. CMS Medicare.gov Compare Post Acute Care list provided to:: Patient Choice offered to / list presented to : Patient  Discharge Placement         Patient to be transferred to facility by: Family   Patient and family notified of of transfer: 06/21/21  Discharge Plan and Services            DME Agency: [place] Arranged: PT [place]: [place] Date [place]: 06/21/21 Time [place]: 0600 Representative spoke with at [place]: Georgina Snell   Readmission Risk Interventions Readmission Risk Prevention Plan 06/21/2021  Transportation Screening Complete  Some recent data might be hidden

## 2021-06-21 NOTE — Progress Notes (Signed)
Nephrology Progress Note:    Patient ID: Lauren Hamilton, female   DOB: Oct 13, 1945, 76 y.o.   MRN: [id number]  S:  1.8L UOP SCr further improved to 3.4, K 3.4 Ate all of breakfast still on LR as mIVF Feels great, wants to go home  O:BP 119/64 (BP Location: Left Arm)    Pulse 74    Temp 98.1 F (36.7 C) (Oral)    Resp 18    Ht 5\' 6"  (1.676 m)    Wt 105.2 kg    SpO2 95%    BMI 37.45 kg/m   Intake/Output Summary (Last 24 hours) at 06/21/2021 1036 Last data filed at 06/21/2021 3710 Gross per 24 hour  Intake 500 ml  Output 1775 ml  Net -1275 ml    Intake/Output: I/O last 3 completed shifts: In: 800 [P.O.:800] Out: 2175 [Urine:2175]  Intake/Output this shift:  No intake/output data recorded. Weight change:     Recent Labs  Lab 06/15/21 1306 06/15/21 2056 06/17/21 0546 06/18/21 0709 06/19/21 0526 06/19/21 0527 06/20/21 0516 06/21/21 0537  NA 135 133* 135 136  --  137 140 139  K 3.4* 2.9* 3.0* 3.4*  --  3.1* 3.8 3.4*  CL 95* 93* 97* 96*  --  101 104 102  CO2 22 20* 24 26  --  25 26 27   GLUCOSE 128* 166* 111* 97  --  119* 101* 99  BUN 121* 129* 118* 98*  --  87* 73* 62*  CREATININE 5.87* 5.83* 5.36* 5.08*  --  4.50* 3.83* 3.41*  ALBUMIN 4.0 3.9 3.1* 3.3* 3.2*  --  3.2* 3.1*  CALCIUM 9.0 8.8* 8.7* 8.9  --  9.0 9.4 9.2  PHOS  --   --  7.8* 6.6* 5.5*  --  3.9 3.9  AST 178* 177* 123* 102* 69*  --   --   --   ALT 119* 117* 90* 85* 69*  --   --   --     Liver Function Tests: Recent Labs  Lab 06/17/21 0546 06/18/21 0709 06/19/21 0526 06/20/21 0516 06/21/21 0537  AST 123* 102* 69*  --   --   ALT 90* 85* 69*  --   --   ALKPHOS 69 70 64  --   --   BILITOT 0.8 0.6 0.6  --   --   PROT 5.6* 5.7* 5.5*  --   --   ALBUMIN 3.1* 3.3* 3.2* 3.2* 3.1*    No results for input(s): LIPASE, AMYLASE in the last 168 hours. No results for input(s): AMMONIA in the last 168 hours. CBC: Recent Labs  Lab 06/15/21 2056 06/17/21 0546 06/18/21 0709 06/19/21 0526  WBC 9.3 6.7 6.9 5.9   HGB 10.6* 8.8* 9.4* 8.8*  HCT 31.2* 26.6* 28.3* 26.7*  MCV 96.3 95.7 97.3 99.6  PLT 198 169 179 168    Cardiac Enzymes: No results for input(s): CKTOTAL, CKMB, CKMBINDEX, TROPONINI in the last 168 hours. CBG: Recent Labs  Lab 06/20/21 0733 06/20/21 1207 06/20/21 1631 06/20/21 2128 06/21/21 0739  GLUCAP 99 148* 97 117* 94     Iron Studies:  No results for input(s): IRON, TIBC, TRANSFERRIN, FERRITIN in the last 72 hours.  Studies/Results: No results found.  allopurinol  100 mg Oral QHS   apixaban  5 mg Oral BID   carvedilol  6.25 mg Oral BID WC   DULoxetine  30 mg Oral Daily   ferrous sulfate  325 mg Oral Q breakfast   insulin aspart  0-9 Units Subcutaneous TID WC   loratadine  10 mg Oral QPM   pantoprazole  40 mg Oral QAC breakfast   polyethylene glycol  17 g Oral Daily   rosuvastatin  5 mg Oral QPM    BMET    Component Value Date/Time   NA 139 06/21/2021 0537   K 3.4 (L) 06/21/2021 0537   CL 102 06/21/2021 0537   CO2 27 06/21/2021 0537   GLUCOSE 99 06/21/2021 0537   BUN 62 (H) 06/21/2021 0537   CREATININE 3.41 (H) 06/21/2021 0537   CREATININE 1.27 (H) 03/11/2020 0930   CALCIUM 9.2 06/21/2021 0537   GFRNONAA 14 (L) 06/21/2021 0537   GFRNONAA 42 (L) 03/11/2020 0930   GFRAA 48 (L) 03/11/2020 0930   CBC    Component Value Date/Time   WBC 5.9 06/19/2021 0526   RBC 2.68 (L) 06/19/2021 0526   HGB 8.8 (L) 06/19/2021 0526   HCT 26.7 (L) 06/19/2021 0526   PLT 168 06/19/2021 0526   MCV 99.6 06/19/2021 0526   MCH 32.8 06/19/2021 0526   MCHC 33.0 06/19/2021 0526   RDW 17.1 (H) 06/19/2021 0526   LYMPHSABS 2.2 10/31/2019 1250   MONOABS 0.5 10/31/2019 1250   EOSABS 0.5 10/31/2019 1250   BASOSABS 0.1 10/31/2019 1250      Assessment/Plan:  Subacute-AKI/CKD stage IIIb - pre-renal and ischemic insults.  Hx heavy NSAID use in the past as well as obesity, DM, and HTN.  Cr 1.3-1.5 until 02/05/21 when it rose to 1.91 and has had a more recent baseline of 1.7-1.9  until 05/13/21 (after starting valsartan) rose to 3.12.  No hydro  Stop IVF, tol PO well Serologies for acute GN negative (dsDNA, ANA, ASO, GBM), normal complements, Urine IFE unremarkable Cont to hold valsartan Note chlorthalidone, lasix, and spironolactone are also listed as home medications - would hold all of these for now and reassess volume status per outpatient nephrologist OK for 1/16 f/u with [facility] and has ordered pre OV labs later this week.   OK For discharge  SOB and DOE - stable to improved Anemia - with heme + stools.  Likely GIB from recent prednisone taper.  Follow H/H and transfuse as needed.  EGD per GI Abnormal LFT's -  hepatitis panel negative.  [place] of liver suggestive of cirrhosis.  GI consulted and Abd [place] with possible hepatic steatosis Hypokalemia - improved Hyponatremia - resolved   Rexene Agent, MD 10:36 AM 06/21/2021

## 2021-06-21 NOTE — Care Management Important Message (Signed)
Important Message  Patient Details  Name: Lauren Hamilton MRN: [id number] Date of Birth: 1945/10/27   Medicare Important Message Given:  Yes     Tommy Medal 06/21/2021, 1:34 PM

## 2021-06-22 DIAGNOSIS — G4733 Obstructive sleep apnea (adult) (pediatric): Secondary | ICD-10-CM | POA: Diagnosis not present

## 2021-06-23 DIAGNOSIS — Z7901 Long term (current) use of anticoagulants: Secondary | ICD-10-CM | POA: Diagnosis not present

## 2021-06-23 DIAGNOSIS — Z7984 Long term (current) use of oral hypoglycemic drugs: Secondary | ICD-10-CM | POA: Diagnosis not present

## 2021-06-23 DIAGNOSIS — N1832 Chronic kidney disease, stage 3b: Secondary | ICD-10-CM | POA: Diagnosis not present

## 2021-06-23 DIAGNOSIS — I129 Hypertensive chronic kidney disease with stage 1 through stage 4 chronic kidney disease, or unspecified chronic kidney disease: Secondary | ICD-10-CM | POA: Diagnosis not present

## 2021-06-23 DIAGNOSIS — N179 Acute kidney failure, unspecified: Secondary | ICD-10-CM | POA: Diagnosis not present

## 2021-06-23 DIAGNOSIS — D631 Anemia in chronic kidney disease: Secondary | ICD-10-CM | POA: Diagnosis not present

## 2021-06-23 DIAGNOSIS — Z96651 Presence of right artificial knee joint: Secondary | ICD-10-CM | POA: Diagnosis not present

## 2021-06-23 DIAGNOSIS — Z9181 History of falling: Secondary | ICD-10-CM | POA: Diagnosis not present

## 2021-06-23 DIAGNOSIS — D509 Iron deficiency anemia, unspecified: Secondary | ICD-10-CM | POA: Diagnosis not present

## 2021-06-23 DIAGNOSIS — M1030 Gout due to renal impairment, unspecified site: Secondary | ICD-10-CM | POA: Diagnosis not present

## 2021-06-23 DIAGNOSIS — E785 Hyperlipidemia, unspecified: Secondary | ICD-10-CM | POA: Diagnosis not present

## 2021-06-23 DIAGNOSIS — E1122 Type 2 diabetes mellitus with diabetic chronic kidney disease: Secondary | ICD-10-CM | POA: Diagnosis not present

## 2021-06-23 DIAGNOSIS — Z9981 Dependence on supplemental oxygen: Secondary | ICD-10-CM | POA: Diagnosis not present

## 2021-06-23 DIAGNOSIS — M1712 Unilateral primary osteoarthritis, left knee: Secondary | ICD-10-CM | POA: Diagnosis not present

## 2021-06-24 DIAGNOSIS — I129 Hypertensive chronic kidney disease with stage 1 through stage 4 chronic kidney disease, or unspecified chronic kidney disease: Secondary | ICD-10-CM | POA: Diagnosis not present

## 2021-06-24 DIAGNOSIS — E211 Secondary hyperparathyroidism, not elsewhere classified: Secondary | ICD-10-CM | POA: Diagnosis not present

## 2021-06-24 DIAGNOSIS — E876 Hypokalemia: Secondary | ICD-10-CM | POA: Diagnosis not present

## 2021-06-24 DIAGNOSIS — E1129 Type 2 diabetes mellitus with other diabetic kidney complication: Secondary | ICD-10-CM | POA: Diagnosis not present

## 2021-06-24 DIAGNOSIS — R809 Proteinuria, unspecified: Secondary | ICD-10-CM | POA: Diagnosis not present

## 2021-06-24 DIAGNOSIS — E1122 Type 2 diabetes mellitus with diabetic chronic kidney disease: Secondary | ICD-10-CM | POA: Diagnosis not present

## 2021-06-24 DIAGNOSIS — N189 Chronic kidney disease, unspecified: Secondary | ICD-10-CM | POA: Diagnosis not present

## 2021-06-24 DIAGNOSIS — I5032 Chronic diastolic (congestive) heart failure: Secondary | ICD-10-CM | POA: Diagnosis not present

## 2021-06-25 DIAGNOSIS — D649 Anemia, unspecified: Secondary | ICD-10-CM | POA: Diagnosis not present

## 2021-06-25 DIAGNOSIS — I5033 Acute on chronic diastolic (congestive) heart failure: Secondary | ICD-10-CM | POA: Diagnosis not present

## 2021-06-25 DIAGNOSIS — N170 Acute kidney failure with tubular necrosis: Secondary | ICD-10-CM | POA: Diagnosis not present

## 2021-06-25 DIAGNOSIS — N189 Chronic kidney disease, unspecified: Secondary | ICD-10-CM | POA: Diagnosis not present

## 2021-06-25 DIAGNOSIS — E1122 Type 2 diabetes mellitus with diabetic chronic kidney disease: Secondary | ICD-10-CM | POA: Diagnosis not present

## 2021-06-25 DIAGNOSIS — E1129 Type 2 diabetes mellitus with other diabetic kidney complication: Secondary | ICD-10-CM | POA: Diagnosis not present

## 2021-06-25 DIAGNOSIS — R809 Proteinuria, unspecified: Secondary | ICD-10-CM | POA: Diagnosis not present

## 2021-06-25 DIAGNOSIS — I129 Hypertensive chronic kidney disease with stage 1 through stage 4 chronic kidney disease, or unspecified chronic kidney disease: Secondary | ICD-10-CM | POA: Diagnosis not present

## 2021-06-28 ENCOUNTER — Encounter: Payer: Self-pay | Admitting: *Deleted

## 2021-06-30 DIAGNOSIS — E559 Vitamin D deficiency, unspecified: Secondary | ICD-10-CM | POA: Diagnosis not present

## 2021-06-30 DIAGNOSIS — R809 Proteinuria, unspecified: Secondary | ICD-10-CM | POA: Diagnosis not present

## 2021-06-30 DIAGNOSIS — I5033 Acute on chronic diastolic (congestive) heart failure: Secondary | ICD-10-CM | POA: Diagnosis not present

## 2021-06-30 DIAGNOSIS — N189 Chronic kidney disease, unspecified: Secondary | ICD-10-CM | POA: Diagnosis not present

## 2021-06-30 DIAGNOSIS — N170 Acute kidney failure with tubular necrosis: Secondary | ICD-10-CM | POA: Diagnosis not present

## 2021-06-30 DIAGNOSIS — E1129 Type 2 diabetes mellitus with other diabetic kidney complication: Secondary | ICD-10-CM | POA: Diagnosis not present

## 2021-06-30 DIAGNOSIS — D649 Anemia, unspecified: Secondary | ICD-10-CM | POA: Diagnosis not present

## 2021-06-30 DIAGNOSIS — E1122 Type 2 diabetes mellitus with diabetic chronic kidney disease: Secondary | ICD-10-CM | POA: Diagnosis not present

## 2021-06-30 DIAGNOSIS — I129 Hypertensive chronic kidney disease with stage 1 through stage 4 chronic kidney disease, or unspecified chronic kidney disease: Secondary | ICD-10-CM | POA: Diagnosis not present

## 2021-07-01 DIAGNOSIS — N189 Chronic kidney disease, unspecified: Secondary | ICD-10-CM | POA: Diagnosis not present

## 2021-07-01 DIAGNOSIS — E211 Secondary hyperparathyroidism, not elsewhere classified: Secondary | ICD-10-CM | POA: Diagnosis not present

## 2021-07-01 DIAGNOSIS — E1122 Type 2 diabetes mellitus with diabetic chronic kidney disease: Secondary | ICD-10-CM | POA: Diagnosis not present

## 2021-07-01 DIAGNOSIS — D638 Anemia in other chronic diseases classified elsewhere: Secondary | ICD-10-CM | POA: Diagnosis not present

## 2021-07-01 DIAGNOSIS — N1832 Chronic kidney disease, stage 3b: Secondary | ICD-10-CM | POA: Diagnosis not present

## 2021-07-01 DIAGNOSIS — R809 Proteinuria, unspecified: Secondary | ICD-10-CM | POA: Diagnosis not present

## 2021-07-01 DIAGNOSIS — I5033 Acute on chronic diastolic (congestive) heart failure: Secondary | ICD-10-CM | POA: Diagnosis not present

## 2021-07-01 DIAGNOSIS — N170 Acute kidney failure with tubular necrosis: Secondary | ICD-10-CM | POA: Diagnosis not present

## 2021-07-01 DIAGNOSIS — N179 Acute kidney failure, unspecified: Secondary | ICD-10-CM | POA: Diagnosis not present

## 2021-07-01 DIAGNOSIS — D508 Other iron deficiency anemias: Secondary | ICD-10-CM | POA: Diagnosis not present

## 2021-07-01 DIAGNOSIS — I129 Hypertensive chronic kidney disease with stage 1 through stage 4 chronic kidney disease, or unspecified chronic kidney disease: Secondary | ICD-10-CM | POA: Diagnosis not present

## 2021-07-01 DIAGNOSIS — E1129 Type 2 diabetes mellitus with other diabetic kidney complication: Secondary | ICD-10-CM | POA: Diagnosis not present

## 2021-07-02 DIAGNOSIS — Z7901 Long term (current) use of anticoagulants: Secondary | ICD-10-CM | POA: Diagnosis not present

## 2021-07-02 DIAGNOSIS — M1030 Gout due to renal impairment, unspecified site: Secondary | ICD-10-CM | POA: Diagnosis not present

## 2021-07-02 DIAGNOSIS — D509 Iron deficiency anemia, unspecified: Secondary | ICD-10-CM | POA: Diagnosis not present

## 2021-07-02 DIAGNOSIS — Z9981 Dependence on supplemental oxygen: Secondary | ICD-10-CM | POA: Diagnosis not present

## 2021-07-02 DIAGNOSIS — D631 Anemia in chronic kidney disease: Secondary | ICD-10-CM | POA: Diagnosis not present

## 2021-07-02 DIAGNOSIS — E1122 Type 2 diabetes mellitus with diabetic chronic kidney disease: Secondary | ICD-10-CM | POA: Diagnosis not present

## 2021-07-02 DIAGNOSIS — Z96651 Presence of right artificial knee joint: Secondary | ICD-10-CM | POA: Diagnosis not present

## 2021-07-02 DIAGNOSIS — Z9181 History of falling: Secondary | ICD-10-CM | POA: Diagnosis not present

## 2021-07-02 DIAGNOSIS — N1832 Chronic kidney disease, stage 3b: Secondary | ICD-10-CM | POA: Diagnosis not present

## 2021-07-02 DIAGNOSIS — Z7984 Long term (current) use of oral hypoglycemic drugs: Secondary | ICD-10-CM | POA: Diagnosis not present

## 2021-07-02 DIAGNOSIS — N179 Acute kidney failure, unspecified: Secondary | ICD-10-CM | POA: Diagnosis not present

## 2021-07-02 DIAGNOSIS — E785 Hyperlipidemia, unspecified: Secondary | ICD-10-CM | POA: Diagnosis not present

## 2021-07-02 DIAGNOSIS — I129 Hypertensive chronic kidney disease with stage 1 through stage 4 chronic kidney disease, or unspecified chronic kidney disease: Secondary | ICD-10-CM | POA: Diagnosis not present

## 2021-07-02 DIAGNOSIS — M1712 Unilateral primary osteoarthritis, left knee: Secondary | ICD-10-CM | POA: Diagnosis not present

## 2021-07-09 DIAGNOSIS — E1129 Type 2 diabetes mellitus with other diabetic kidney complication: Secondary | ICD-10-CM | POA: Diagnosis not present

## 2021-07-09 DIAGNOSIS — E1165 Type 2 diabetes mellitus with hyperglycemia: Secondary | ICD-10-CM | POA: Diagnosis not present

## 2021-07-09 DIAGNOSIS — Z299 Encounter for prophylactic measures, unspecified: Secondary | ICD-10-CM | POA: Diagnosis not present

## 2021-07-09 DIAGNOSIS — I10 Essential (primary) hypertension: Secondary | ICD-10-CM | POA: Diagnosis not present

## 2021-07-09 DIAGNOSIS — Z789 Other specified health status: Secondary | ICD-10-CM | POA: Diagnosis not present

## 2021-07-09 DIAGNOSIS — Z6838 Body mass index (BMI) 38.0-38.9, adult: Secondary | ICD-10-CM | POA: Diagnosis not present

## 2021-07-09 DIAGNOSIS — R809 Proteinuria, unspecified: Secondary | ICD-10-CM | POA: Diagnosis not present

## 2021-07-09 DIAGNOSIS — I4891 Unspecified atrial fibrillation: Secondary | ICD-10-CM | POA: Diagnosis not present

## 2021-07-15 DIAGNOSIS — R809 Proteinuria, unspecified: Secondary | ICD-10-CM | POA: Diagnosis not present

## 2021-07-15 DIAGNOSIS — I129 Hypertensive chronic kidney disease with stage 1 through stage 4 chronic kidney disease, or unspecified chronic kidney disease: Secondary | ICD-10-CM | POA: Diagnosis not present

## 2021-07-15 DIAGNOSIS — N189 Chronic kidney disease, unspecified: Secondary | ICD-10-CM | POA: Diagnosis not present

## 2021-07-15 DIAGNOSIS — I5033 Acute on chronic diastolic (congestive) heart failure: Secondary | ICD-10-CM | POA: Diagnosis not present

## 2021-07-15 DIAGNOSIS — N170 Acute kidney failure with tubular necrosis: Secondary | ICD-10-CM | POA: Diagnosis not present

## 2021-07-15 DIAGNOSIS — E1129 Type 2 diabetes mellitus with other diabetic kidney complication: Secondary | ICD-10-CM | POA: Diagnosis not present

## 2021-07-15 DIAGNOSIS — E1122 Type 2 diabetes mellitus with diabetic chronic kidney disease: Secondary | ICD-10-CM | POA: Diagnosis not present

## 2021-07-20 DIAGNOSIS — I5033 Acute on chronic diastolic (congestive) heart failure: Secondary | ICD-10-CM | POA: Diagnosis not present

## 2021-07-20 DIAGNOSIS — N170 Acute kidney failure with tubular necrosis: Secondary | ICD-10-CM | POA: Diagnosis not present

## 2021-07-20 DIAGNOSIS — E1122 Type 2 diabetes mellitus with diabetic chronic kidney disease: Secondary | ICD-10-CM | POA: Diagnosis not present

## 2021-07-20 DIAGNOSIS — E1129 Type 2 diabetes mellitus with other diabetic kidney complication: Secondary | ICD-10-CM | POA: Diagnosis not present

## 2021-07-20 DIAGNOSIS — R809 Proteinuria, unspecified: Secondary | ICD-10-CM | POA: Diagnosis not present

## 2021-07-20 DIAGNOSIS — N189 Chronic kidney disease, unspecified: Secondary | ICD-10-CM | POA: Diagnosis not present

## 2021-07-20 DIAGNOSIS — I129 Hypertensive chronic kidney disease with stage 1 through stage 4 chronic kidney disease, or unspecified chronic kidney disease: Secondary | ICD-10-CM | POA: Diagnosis not present

## 2021-07-20 NOTE — Progress Notes (Deleted)
Primary Care Physician: Monico Blitz, MD  Primary Gastroenterologist:  Garfield Cornea, MD   No chief complaint on file.   HPI: Lauren Hamilton is a 76 y.o. female here for for hospital follow-up.  She was seen for anemia, heme positive stools, elevated LFTs.  Presenting to the hospital also with acute on chronic kidney disease.  Denies any melena or rectal bleeding.  Presenting hemoglobin of 10.6, BUN 129, creatinine 5.83.  B12, ferritin, iron, folate all normal.  On presentation her AST was 178, ALT 119.  Outpatient labs just prior to admission showed AST of 197, ALT 149.  LFTs were normal in 2021.  Abdominal ultrasound shows slight lobular liver contour suggestive of cirrhosis, increased parenchymal echogenicity suggestive of hepatic steatosis.  No evidence of portal hypertension.  She completed EGD while inpatient.  She had multiple gastric polyps, biopsy showed hyperplastic.  At time of discharge her creatinine was down to 3.41, BUN 62.  Her LFTs trended downward and when last checked on January 7 her AST was 69, ALT 69.  Hemoglobin was 8.8, hematocrit 26.7, platelets 168,000 mitochondrial antibodies were 20.8, equivocal, IgG slightly low at 530, acute viral hepatitis panel negative, alpha-1 antitrypsin level normal, anti-smooth muscle antibody normal, ANA negative, iron studies normal.  Last colonoscopy was in December 2019, she had three 4 to 6 mm polyps at the hepatic flexure removed, one 7 mm polyp at the hepatic flexure removed, all were tubular adenomas.  Current Outpatient Medications  Medication Sig Dispense Refill   allopurinol (ZYLOPRIM) 100 MG tablet Take 300 mg by mouth at bedtime.      apixaban (ELIQUIS) 5 MG TABS tablet Take 1 tablet (5 mg total) by mouth 2 (two) times daily. 60 tablet 1   Artificial Tear Solution (SOOTHE XP) SOLN Apply 1-2 drops to eye as needed (dry eyes).      Ascorbic Acid (VITAMIN C PO) Take 1 tablet by mouth daily.     carvedilol (COREG) 6.25 MG  tablet Take 1 tablet (6.25 mg total) by mouth 2 (two) times daily with a meal. 60 tablet 1   ferrous sulfate 325 (65 FE) MG tablet Take 325 mg by mouth daily.     levocetirizine (XYZAL) 5 MG tablet Take 5 mg by mouth every evening.     linagliptin (TRADJENTA) 5 MG TABS tablet Take 1 tablet (5 mg total) by mouth daily. 30 tablet 1   MAGNESIUM PO Take 1 tablet by mouth daily.     omeprazole (PRILOSEC) 40 MG capsule Take 40 mg by mouth daily.     rosuvastatin (CRESTOR) 10 MG tablet Take 1 tablet (10 mg total) by mouth every evening. 30 tablet 1   SUMAtriptan (IMITREX) 100 MG tablet Take 1 tablet by mouth every 2 (two) hours as needed for migraine.     vitamin E 400 UNIT capsule Take 400 Units by mouth daily.     No current facility-administered medications for this visit.    Allergies as of 07/21/2021 - Review Complete 06/19/2021  Allergen Reaction Noted   Other Itching 10/13/2017   Latex Rash 06/15/2011   Clonidine derivatives  10/31/2019   Etodolac  04/12/2018   Hydralazine  03/17/2020   Hydrocodone Other (See Comments) 10/13/2017   Naproxen Other (See Comments) 11/10/2011   Statins Other (See Comments) 10/13/2017   Tramadol  10/20/2011   Amlodipine Cough 01/25/2018   Lisinopril Cough 01/25/2018   Losartan Cough 01/25/2018    ROS:  General: Negative for anorexia,  weight loss, fever, chills, fatigue, weakness. ENT: Negative for hoarseness, difficulty swallowing , nasal congestion. CV: Negative for chest pain, angina, palpitations, dyspnea on exertion, peripheral edema.  Respiratory: Negative for dyspnea at rest, dyspnea on exertion, cough, sputum, wheezing.  GI: See history of present illness. GU:  Negative for dysuria, hematuria, urinary incontinence, urinary frequency, nocturnal urination.  Endo: Negative for unusual weight change.    Physical Examination:   There were no vitals taken for this visit.  General: Well-nourished, well-developed in no acute distress.  Eyes: No  icterus. Mouth: Oropharyngeal mucosa moist and pink , no lesions erythema or exudate. Lungs: Clear to auscultation bilaterally.  Heart: Regular rate and rhythm, no murmurs rubs or gallops.  Abdomen: Bowel sounds are normal, nontender, nondistended, no hepatosplenomegaly or masses, no abdominal bruits or hernia , no rebound or guarding.   Extremities: No lower extremity edema. No clubbing or deformities. Neuro: Alert and oriented x 4   Skin: Warm and dry, no jaundice.   Psych: Alert and cooperative, normal mood and affect.  Labs:  ***  Imaging Studies: No results found.   Assessment:     Plan:

## 2021-07-21 ENCOUNTER — Encounter: Payer: Self-pay | Admitting: Internal Medicine

## 2021-07-21 ENCOUNTER — Ambulatory Visit: Payer: PPO | Admitting: Gastroenterology

## 2021-07-27 DIAGNOSIS — M7051 Other bursitis of knee, right knee: Secondary | ICD-10-CM | POA: Diagnosis not present

## 2021-07-27 DIAGNOSIS — Z96651 Presence of right artificial knee joint: Secondary | ICD-10-CM | POA: Diagnosis not present

## 2021-07-27 DIAGNOSIS — M1712 Unilateral primary osteoarthritis, left knee: Secondary | ICD-10-CM | POA: Diagnosis not present

## 2021-08-06 DIAGNOSIS — I5033 Acute on chronic diastolic (congestive) heart failure: Secondary | ICD-10-CM | POA: Diagnosis not present

## 2021-08-06 DIAGNOSIS — E1129 Type 2 diabetes mellitus with other diabetic kidney complication: Secondary | ICD-10-CM | POA: Diagnosis not present

## 2021-08-06 DIAGNOSIS — E1122 Type 2 diabetes mellitus with diabetic chronic kidney disease: Secondary | ICD-10-CM | POA: Diagnosis not present

## 2021-08-06 DIAGNOSIS — R809 Proteinuria, unspecified: Secondary | ICD-10-CM | POA: Diagnosis not present

## 2021-08-06 DIAGNOSIS — I129 Hypertensive chronic kidney disease with stage 1 through stage 4 chronic kidney disease, or unspecified chronic kidney disease: Secondary | ICD-10-CM | POA: Diagnosis not present

## 2021-08-06 DIAGNOSIS — N170 Acute kidney failure with tubular necrosis: Secondary | ICD-10-CM | POA: Diagnosis not present

## 2021-08-06 DIAGNOSIS — N189 Chronic kidney disease, unspecified: Secondary | ICD-10-CM | POA: Diagnosis not present

## 2021-08-08 NOTE — Progress Notes (Addendum)
Primary Care Physician:  Monico Blitz, MD Primary GI:  Garfield Cornea, MD Patient Location: Home Provider Location: [facility] office  Reason for Visit:  Chief Complaint  Patient presents with   Colonoscopy    Persons present on the virtual encounter, with roles: Patient, myself (provider), Orland Jarred CMA (updated meds and allergies)  Total time (minutes) spent on medical discussion: 12 minutes  Due to COVID-19, visit was conducted using Mychart video method.  Visit was requested by patient.  Virtual Visit via Mychart video  I connected with Lauren Hamilton on 08/09/21 at  3:30 PM EST by Mychart video and verified that I am speaking with the correct person using two identifiers.   I discussed the limitations, risks, security and privacy concerns of performing an evaluation and management service by telephone/video and the availability of in person appointments. I also discussed with the patient that there may be a patient responsible charge related to this service. The patient expressed understanding and agreed to proceed.   HPI:   Lauren Hamilton is a 76 y.o. female who presents for virtual visit regarding anemia, abnormal LFTs.  Patient has a past medical history significant for IDA, diabetes, hypertension, chronic bronchitis, PEs on chronic anticoagulation (Xarelto), gout, chronic kidney disease stage IIIb.    Patient recently seen while in the hospital back in January.  She presented with increased shortness of breath, swelling, fatigue.  In the ED her hemoglobin was 10.6, heme positive stool.  LFTs elevated with AST of 177, ALT 117.  Her BUN was 129, creatinine 5.83, ferritin 177, iron saturations 13%, serum iron 58, TIBC 445, B12 and folate normal.  EGD 06/2021: 2cm hiatal hernia and multiple gastric polyps (3 were removed).   Abnormal LFTs: work up included, negative AMA, immunoglobulins unremarkable, alpha-1 antitrypsin level normal, acute hepatitis panel negative,  hemochromatosis all negative. AMA 20.8, equivocal, with normal IgM, with normal AlkPhos PBC less likely but needs to be followed.  She had been on recent antibiotics for sinusitis but was unsure of the name of the medication.  Also taking slippery elm for constipation.  Abdominal ultrasound showed slightly lobular liver contour with increased parenchymal echogenicity, suggestive of hepatic steatosis plus or minus cirrhosis.  Spleen normal in size.  At time of discharge (June 19, 2021) her creatinine had improved to 3.41, BUN down to 62, total bilirubin 0.6, alkaline phosphatase 64, AST down to 69, ALT down to 69.  Hemoglobin 8.8.  Most recent labs from 08/06/2021: Hemoglobin 11.3, hematocrit 34.2, BUN 37, creatinine 1.39.  LFTs have not been checked since discharge.  Last colonoscopy December 2019: 4 polyps removed, 3 were tubular adenomas.  Nonbleeding internal and external hemorrhoids.  Today: No melena, brbpr. Eating gummy bears, sugar free help keep bowels regular. Eats some about every 2-3 days. No heartburn. No dysphagia. Miralax caused gas. No N/V. Having a lot of issues with lower extremity edema, but improving.  Sees nephrology in a couple of days.  Denies abdominal pain.  Several medications changed after her recent discharge.  Xarelto switched to Eliquis.  Current Outpatient Medications  Medication Sig Dispense Refill   allopurinol (ZYLOPRIM) 100 MG tablet Take 300 mg by mouth at bedtime.      apixaban (ELIQUIS) 5 MG TABS tablet Take 1 tablet (5 mg total) by mouth 2 (two) times daily. 60 tablet 1   Artificial Tear Solution (SOOTHE XP) SOLN Apply 1-2 drops to eye as needed (dry eyes).  Ascorbic Acid (VITAMIN C PO) Take 1 tablet by mouth daily.     carvedilol (COREG) 6.25 MG tablet Take 1 tablet (6.25 mg total) by mouth 2 (two) times daily with a meal. 60 tablet 1   dapagliflozin propanediol (FARXIGA) 5 MG TABS tablet Take 5 mg by mouth daily. Take one tablet daily     levocetirizine  (XYZAL) 5 MG tablet Take 5 mg by mouth every evening.     linagliptin (TRADJENTA) 5 MG TABS tablet Take 1 tablet (5 mg total) by mouth daily. 30 tablet 1   MAGNESIUM PO Take 1 tablet by mouth daily.     omeprazole (PRILOSEC) 40 MG capsule Take 40 mg by mouth daily.     rosuvastatin (CRESTOR) 10 MG tablet Take 1 tablet (10 mg total) by mouth every evening. 30 tablet 1   SPIRULINA PO Take by mouth. Take three tablets once daily     SUMAtriptan (IMITREX) 100 MG tablet Take 1 tablet by mouth every 2 (two) hours as needed for migraine.     vitamin E 400 UNIT capsule Take 400 Units by mouth daily.     No current facility-administered medications for this visit.    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 advised 5 year follow up colonoscopy.    COLONOSCOPY N/A 06/12/2018   [place]: three 4-22mm polyps at heptic flexure, one 38mm polyp at hepatic flexure all tubular adenomas, non bleeding external and internal hemorrhoids   ESOPHAGOGASTRODUODENOSCOPY (EGD) WITH PROPOFOL N/A 06/18/2021   Procedure: ESOPHAGOGASTRODUODENOSCOPY (EGD) WITH PROPOFOL;  Surgeon: Harvel Quale, MD;  Location: [facility];  Service: Gastroenterology;  Laterality: N/A;   KNEE ARTHROSCOPY Right    PARTIAL HYSTERECTOMY     POLYPECTOMY  06/12/2018   Procedure: POLYPECTOMY;  Surgeon: [name],  MD;  Location: [facility];  Service: Endoscopy;;  hep.flex   POLYPECTOMY  06/18/2021   Procedure: POLYPECTOMY;  Surgeon: Harvel Quale, MD;  Location: [facility];  Service: Gastroenterology;;   THYROIDECTOMY, PARTIAL     TONSILLECTOMY      Family History  Problem Relation Age of Onset   Allergic rhinitis Sister    Allergic rhinitis Brother    Hypertension Mother    Heart attack Father    Cirrhosis Father    Emphysema Father    Breast cancer Maternal Aunt    Colon cancer Neg Hx     Social History   Socioeconomic History   Marital status: Widowed    Spouse name: Not on file   Number of children: Not on file   Years of education: Not on file   Highest education level: Not on file  Occupational History   Not on file  Tobacco Use   Smoking status: Never   Smokeless  tobacco: Never  Vaping Use   Vaping Use: Never used  Substance and Sexual Activity   Alcohol use: Never   Drug use: Never   Sexual activity: Not Currently  Other Topics Concern   Not on file  Social History Narrative   Not on file   Social Determinants of Health   Financial Resource Strain: Not on file  Food Insecurity: Not on file  Transportation Needs: Not on file  Physical Activity: Not on file  Stress: Not on file  Social Connections: Not on file  Intimate Partner Violence: Not on file      ROS:  General: Negative for anorexia, weight loss, fever, chills, fatigue, weakness. Eyes: Negative for vision changes.  ENT: Negative for hoarseness, difficulty swallowing , nasal congestion. CV: Negative for chest pain, angina, palpitations, dyspnea on exertion, +peripheral edema.  Respiratory: Negative for dyspnea at rest, dyspnea on exertion, cough, sputum, wheezing.  GI: See history of present illness. GU:  Negative for dysuria, hematuria, urinary incontinence, urinary frequency, nocturnal urination.  MS: Negative for joint pain, low back pain.  Derm: Negative for rash or itching.   Neuro: Negative for weakness, abnormal sensation, seizure, frequent headaches, memory loss, confusion.  Psych: Negative for anxiety, depression, suicidal ideation, hallucinations.  Endo: Negative for unusual weight change.  Heme: Negative for bruising or bleeding. Allergy: Negative for rash or hives.   Observations/Objective: Pleasant, alert and oriented elderly female in no acute distress.  No shortness of breath.  Otherwise exam unavailable.  See labs as outlined above.  Assessment and Plan:  IDA: Present with symptomatic anemia recently as outlined above.  Heme positive stools in the setting of chronic anticoagulation for history of PEs.  Also with acute on chronic renal disease, abnormal LFTs.  Anemia labs consistent with IDA.  EGD with multiple gastric polyps, small hiatal hernia.  Remote colonoscopy in 2019 with history of adenomatous colon polyps.  Presents today to schedule colonoscopy to continue work-up for IDA.  Recent hemoglobin in the 11 range.  Abnormal LFTs: Recently presented with new transaminitis.  Abdominal ultrasound with nodular liver contour, hepatic steatosis.  Spleen normal in size.  No evidence of portal hypertension on EGD.  Chronically anticoagulated for PEs.  Her transaminases had improved at time of discharge but remained elevated in the 60 range.  It was felt that she likely had drug-induced liver injury.  Extensive serologies as outlined above overall unremarkable except for equivocal AMA.  Given normal alkaline phosphatase it was felt that she likely did not have PBC.  We will continue to follow her, update LFTs in the near future.   Colonoscopy with Dr. Gala Romney with propofol.  ASA 3.  I have discussed the risks, alternatives, benefits with regards to but not limited to the risk of reaction to medication, bleeding, infection, perforation and the patient is agreeable to proceed. Written consent to be obtained. Hold Eliquis 48 hours before procedure. Use TriLyte and  tapwater enemas due to history of acute on chronic renal disease. Update LFTs.    Follow Up Instructions:    I discussed the assessment and treatment plan with the patient. The patient was provided an opportunity to ask questions and all were answered. The patient agreed with the plan and demonstrated an understanding of the instructions. AVS mailed to patient's home address.   The patient was advised to call back or seek an in-person evaluation if the symptoms worsen or if the condition fails to improve as anticipated.  I provided 12 minutes  of virtual face-to-face time during this encounter.   Neil Crouch, PA-C

## 2021-08-09 ENCOUNTER — Other Ambulatory Visit: Payer: Self-pay

## 2021-08-09 ENCOUNTER — Telehealth: Payer: Self-pay | Admitting: *Deleted

## 2021-08-09 ENCOUNTER — Encounter: Payer: Self-pay | Admitting: Gastroenterology

## 2021-08-09 ENCOUNTER — Telehealth (INDEPENDENT_AMBULATORY_CARE_PROVIDER_SITE_OTHER): Payer: PPO | Admitting: Gastroenterology

## 2021-08-09 VITALS — Ht 66.0 in | Wt 227.0 lb

## 2021-08-09 DIAGNOSIS — R7401 Elevation of levels of liver transaminase levels: Secondary | ICD-10-CM

## 2021-08-09 DIAGNOSIS — Z860100 Personal history of colon polyps, unspecified: Secondary | ICD-10-CM

## 2021-08-09 DIAGNOSIS — D500 Iron deficiency anemia secondary to blood loss (chronic): Secondary | ICD-10-CM | POA: Diagnosis not present

## 2021-08-09 DIAGNOSIS — D509 Iron deficiency anemia, unspecified: Secondary | ICD-10-CM | POA: Insufficient documentation

## 2021-08-09 DIAGNOSIS — R195 Other fecal abnormalities: Secondary | ICD-10-CM | POA: Diagnosis not present

## 2021-08-09 NOTE — Telephone Encounter (Signed)
Lauren Hamilton, you are scheduled for a virtual visit with your provider today.  Just as we do with appointments in the office, we must obtain your consent to participate.  Your consent will be active for this visit and any virtual visit you may have with one of our providers in the next 365 days.  If you have a MyChart account, I can also send a copy of this consent to you electronically.  All virtual visits are billed to your insurance company just like a traditional visit in the office.  As this is a virtual visit, video technology does not allow for your provider to perform a traditional examination.  This may limit your provider's ability to fully assess your condition.  If your provider identifies any concerns that need to be evaluated in person or the need to arrange testing such as labs, EKG, etc, we will make arrangements to do so.  Although advances in technology are sophisticated, we cannot ensure that it will always work on either your end or our end.  If the connection with a video visit is poor, we may have to switch to a telephone visit.  With either a video or telephone visit, we are not always able to ensure that we have a secure connection.   I need to obtain your verbal consent now.   Are you willing to proceed with your visit today?

## 2021-08-09 NOTE — Patient Instructions (Signed)
Colonoscopy to be scheduled in the near future.  See separate instructions. You will need to hold Eliquis 48 hours prior to your colonoscopy. Please go for blood work at your convenience to follow-up on your liver numbers.  You also had somewhat nodular liver seen on ultrasound when you were in the hospital, this can be seen with scarring and early cirrhosis. Your liver did have a fatty appearance which can be seen with diabetes and obesity. Good news, there were no other worrisome findings to support advanced liver disease on ultrasound and endoscopy.  We will monitor you for this.

## 2021-08-09 NOTE — Telephone Encounter (Signed)
Pt consented to a virtual visit. 

## 2021-08-10 ENCOUNTER — Telehealth: Payer: Self-pay

## 2021-08-10 DIAGNOSIS — I10 Essential (primary) hypertension: Secondary | ICD-10-CM | POA: Diagnosis not present

## 2021-08-10 DIAGNOSIS — Z299 Encounter for prophylactic measures, unspecified: Secondary | ICD-10-CM | POA: Diagnosis not present

## 2021-08-10 DIAGNOSIS — E1165 Type 2 diabetes mellitus with hyperglycemia: Secondary | ICD-10-CM | POA: Diagnosis not present

## 2021-08-10 DIAGNOSIS — Z2821 Immunization not carried out because of patient refusal: Secondary | ICD-10-CM | POA: Diagnosis not present

## 2021-08-10 DIAGNOSIS — Z6836 Body mass index (BMI) 36.0-36.9, adult: Secondary | ICD-10-CM | POA: Diagnosis not present

## 2021-08-10 DIAGNOSIS — Z789 Other specified health status: Secondary | ICD-10-CM | POA: Diagnosis not present

## 2021-08-10 NOTE — Telephone Encounter (Signed)
Called pt to schedule TCS w/Propofol ASA 3 w/Dr. Gala Romney. She prefers to have TCS on a Friday d/t transportation. Advised her we will call back when April schedule is available.

## 2021-08-12 DIAGNOSIS — D649 Anemia, unspecified: Secondary | ICD-10-CM | POA: Diagnosis not present

## 2021-08-12 DIAGNOSIS — I129 Hypertensive chronic kidney disease with stage 1 through stage 4 chronic kidney disease, or unspecified chronic kidney disease: Secondary | ICD-10-CM | POA: Diagnosis not present

## 2021-08-12 DIAGNOSIS — I5032 Chronic diastolic (congestive) heart failure: Secondary | ICD-10-CM | POA: Diagnosis not present

## 2021-08-12 DIAGNOSIS — R809 Proteinuria, unspecified: Secondary | ICD-10-CM | POA: Diagnosis not present

## 2021-08-12 DIAGNOSIS — N189 Chronic kidney disease, unspecified: Secondary | ICD-10-CM | POA: Diagnosis not present

## 2021-08-12 DIAGNOSIS — E1129 Type 2 diabetes mellitus with other diabetic kidney complication: Secondary | ICD-10-CM | POA: Diagnosis not present

## 2021-08-12 DIAGNOSIS — E211 Secondary hyperparathyroidism, not elsewhere classified: Secondary | ICD-10-CM | POA: Diagnosis not present

## 2021-08-12 DIAGNOSIS — E1122 Type 2 diabetes mellitus with diabetic chronic kidney disease: Secondary | ICD-10-CM | POA: Diagnosis not present

## 2021-08-19 ENCOUNTER — Encounter: Payer: Self-pay | Admitting: *Deleted

## 2021-08-19 MED ORDER — PEG 3350-KCL-NA BICARB-NACL 420 G PO SOLR: ORAL | 0 refills | Status: DC

## 2021-08-19 NOTE — Addendum Note (Signed)
Addended by: Cheron Every on: 08/19/2021 09:36 AM ? ? Modules accepted: Orders ? ?

## 2021-08-19 NOTE — Telephone Encounter (Signed)
Called pt to schedule. Gave 2 available Thursday for Dr. Gala Romney. She will speak with daughter and call back to schedule ?

## 2021-08-19 NOTE — Telephone Encounter (Signed)
Patient returned call. Scheduled for 4/27 at 8:15am. Aware will need pre-op appt prior. Will send rx to [facility]. Will mail prep instructions. Aware to hold eliquis 48 hrs prior ?

## 2021-08-25 ENCOUNTER — Telehealth: Payer: Self-pay | Admitting: Internal Medicine

## 2021-08-25 DIAGNOSIS — G4733 Obstructive sleep apnea (adult) (pediatric): Secondary | ICD-10-CM | POA: Diagnosis not present

## 2021-08-25 NOTE — Telephone Encounter (Signed)
Called pt. Advised we don't have iron on her med list. She states she takes 3 tablets once a day ('1500mg'$  daily) of spirulina and reports this is iron supplement.  ?Patient aware needs to hold x 7 days prior. FYI to [place] ?

## 2021-08-25 NOTE — Telephone Encounter (Signed)
Pt already aware.

## 2021-08-25 NOTE — Telephone Encounter (Signed)
Pt has questions if she needs to hold her iron prior to her procedure. [phone] ?

## 2021-08-25 NOTE — Telephone Encounter (Signed)
Spirulina is a supplement with several nutrients but does contain some iron. Agree with holding 7 days.  ?

## 2021-08-26 DIAGNOSIS — I129 Hypertensive chronic kidney disease with stage 1 through stage 4 chronic kidney disease, or unspecified chronic kidney disease: Secondary | ICD-10-CM | POA: Diagnosis not present

## 2021-08-26 DIAGNOSIS — E1129 Type 2 diabetes mellitus with other diabetic kidney complication: Secondary | ICD-10-CM | POA: Diagnosis not present

## 2021-08-26 DIAGNOSIS — N189 Chronic kidney disease, unspecified: Secondary | ICD-10-CM | POA: Diagnosis not present

## 2021-08-26 DIAGNOSIS — E211 Secondary hyperparathyroidism, not elsewhere classified: Secondary | ICD-10-CM | POA: Diagnosis not present

## 2021-08-26 DIAGNOSIS — E1122 Type 2 diabetes mellitus with diabetic chronic kidney disease: Secondary | ICD-10-CM | POA: Diagnosis not present

## 2021-08-26 DIAGNOSIS — I5032 Chronic diastolic (congestive) heart failure: Secondary | ICD-10-CM | POA: Diagnosis not present

## 2021-08-26 DIAGNOSIS — D649 Anemia, unspecified: Secondary | ICD-10-CM | POA: Diagnosis not present

## 2021-08-26 DIAGNOSIS — R809 Proteinuria, unspecified: Secondary | ICD-10-CM | POA: Diagnosis not present

## 2021-09-07 DIAGNOSIS — Z789 Other specified health status: Secondary | ICD-10-CM | POA: Diagnosis not present

## 2021-09-07 DIAGNOSIS — Z299 Encounter for prophylactic measures, unspecified: Secondary | ICD-10-CM | POA: Diagnosis not present

## 2021-09-07 DIAGNOSIS — I10 Essential (primary) hypertension: Secondary | ICD-10-CM | POA: Diagnosis not present

## 2021-09-07 DIAGNOSIS — J069 Acute upper respiratory infection, unspecified: Secondary | ICD-10-CM | POA: Diagnosis not present

## 2021-09-13 DIAGNOSIS — Z6836 Body mass index (BMI) 36.0-36.9, adult: Secondary | ICD-10-CM | POA: Diagnosis not present

## 2021-09-13 DIAGNOSIS — Z299 Encounter for prophylactic measures, unspecified: Secondary | ICD-10-CM | POA: Diagnosis not present

## 2021-09-13 DIAGNOSIS — N184 Chronic kidney disease, stage 4 (severe): Secondary | ICD-10-CM | POA: Diagnosis not present

## 2021-09-13 DIAGNOSIS — E1165 Type 2 diabetes mellitus with hyperglycemia: Secondary | ICD-10-CM | POA: Diagnosis not present

## 2021-09-13 DIAGNOSIS — I10 Essential (primary) hypertension: Secondary | ICD-10-CM | POA: Diagnosis not present

## 2021-09-14 DIAGNOSIS — L570 Actinic keratosis: Secondary | ICD-10-CM | POA: Diagnosis not present

## 2021-09-20 DIAGNOSIS — E1122 Type 2 diabetes mellitus with diabetic chronic kidney disease: Secondary | ICD-10-CM | POA: Diagnosis not present

## 2021-09-20 DIAGNOSIS — E1129 Type 2 diabetes mellitus with other diabetic kidney complication: Secondary | ICD-10-CM | POA: Diagnosis not present

## 2021-09-20 DIAGNOSIS — N189 Chronic kidney disease, unspecified: Secondary | ICD-10-CM | POA: Diagnosis not present

## 2021-09-20 DIAGNOSIS — R809 Proteinuria, unspecified: Secondary | ICD-10-CM | POA: Diagnosis not present

## 2021-09-20 DIAGNOSIS — I5032 Chronic diastolic (congestive) heart failure: Secondary | ICD-10-CM | POA: Diagnosis not present

## 2021-09-20 DIAGNOSIS — I129 Hypertensive chronic kidney disease with stage 1 through stage 4 chronic kidney disease, or unspecified chronic kidney disease: Secondary | ICD-10-CM | POA: Diagnosis not present

## 2021-09-22 DIAGNOSIS — G4733 Obstructive sleep apnea (adult) (pediatric): Secondary | ICD-10-CM | POA: Diagnosis not present

## 2021-09-23 DIAGNOSIS — N189 Chronic kidney disease, unspecified: Secondary | ICD-10-CM | POA: Diagnosis not present

## 2021-09-23 DIAGNOSIS — I5032 Chronic diastolic (congestive) heart failure: Secondary | ICD-10-CM | POA: Diagnosis not present

## 2021-09-23 DIAGNOSIS — R809 Proteinuria, unspecified: Secondary | ICD-10-CM | POA: Diagnosis not present

## 2021-09-23 DIAGNOSIS — E1129 Type 2 diabetes mellitus with other diabetic kidney complication: Secondary | ICD-10-CM | POA: Diagnosis not present

## 2021-09-23 DIAGNOSIS — E876 Hypokalemia: Secondary | ICD-10-CM | POA: Diagnosis not present

## 2021-09-23 DIAGNOSIS — E1122 Type 2 diabetes mellitus with diabetic chronic kidney disease: Secondary | ICD-10-CM | POA: Diagnosis not present

## 2021-09-23 DIAGNOSIS — I129 Hypertensive chronic kidney disease with stage 1 through stage 4 chronic kidney disease, or unspecified chronic kidney disease: Secondary | ICD-10-CM | POA: Diagnosis not present

## 2021-09-24 DIAGNOSIS — I4891 Unspecified atrial fibrillation: Secondary | ICD-10-CM | POA: Diagnosis not present

## 2021-09-24 DIAGNOSIS — Z299 Encounter for prophylactic measures, unspecified: Secondary | ICD-10-CM | POA: Diagnosis not present

## 2021-09-24 DIAGNOSIS — R809 Proteinuria, unspecified: Secondary | ICD-10-CM | POA: Diagnosis not present

## 2021-09-24 DIAGNOSIS — I10 Essential (primary) hypertension: Secondary | ICD-10-CM | POA: Diagnosis not present

## 2021-09-24 DIAGNOSIS — E1140 Type 2 diabetes mellitus with diabetic neuropathy, unspecified: Secondary | ICD-10-CM | POA: Diagnosis not present

## 2021-09-24 DIAGNOSIS — E1129 Type 2 diabetes mellitus with other diabetic kidney complication: Secondary | ICD-10-CM | POA: Diagnosis not present

## 2021-09-24 DIAGNOSIS — Z789 Other specified health status: Secondary | ICD-10-CM | POA: Diagnosis not present

## 2021-09-25 DIAGNOSIS — G4733 Obstructive sleep apnea (adult) (pediatric): Secondary | ICD-10-CM | POA: Diagnosis not present

## 2021-10-04 ENCOUNTER — Telehealth: Payer: Self-pay | Admitting: Gastroenterology

## 2021-10-04 NOTE — Patient Instructions (Signed)
? ? Lauren Hamilton ? 10/04/2021  ?  ? '@PREFPERIOPPHARMACY'$ @ ? ? Your procedure is scheduled on 10/07/2021. ? Report to [facility] at 6:45 A.M. ? Call this number if you have problems the morning of surgery: ? [phone] ? ? Remember: ?  ? Please Follow the diet and prep instructions given to you by Dr Roseanne Kaufman office.  ?  ? Take these medicines the morning of surgery with A SIP OF WATER : Carvedilol, Prilosec and Imitrex(if needed). ? ? ? Last dose of Eliquis should be 10/05/2021 ?  ? Do not wear jewelry, make-up or nail polish. ? Do not wear lotions, powders, or perfumes, or deodorant. ? Do not shave 48 hours prior to surgery.  Men may shave face and neck. ? Do not bring valuables to the hospital. ? [facility] is not responsible for any belongings or valuables. ? ?Contacts, dentures or bridgework may not be worn into surgery.  Leave your suitcase in the car.  After surgery it may be brought to your room. ? ?For patients admitted to the hospital, discharge time will be determined by your treatment team. ? ?Patients discharged the day of surgery will not be allowed to drive home.  ? ?Name and phone number of your driver:   Family ?Special instructions:  N/A ? ?Please read over the following fact sheets that you were given. ?Care and Recovery After Surgery ? ?Colonoscopy, Adult ?A colonoscopy is a procedure to look at the entire large intestine. This procedure is done using a long, thin, flexible tube that has a camera on the end. ?You may have a colonoscopy: ?As a part of normal colorectal screening. ?If you have certain symptoms, such as: ?A low number of red blood cells in your blood (anemia). ?Diarrhea that does not go away. ?Pain in your abdomen. ?Blood in your stool. ?A colonoscopy can help screen for and diagnose medical problems, including: ?An abnormal growth of cells or tissue (tumor). ?Abnormal growths within the lining of your intestine (polyps). ?Inflammation. ?Areas of bleeding. ?Tell your health care  provider about: ?Any allergies you have. ?All medicines you are taking, including vitamins, herbs, eye drops, creams, and over-the-counter medicines. ?Any problems you or family members have had with anesthetic medicines. ?Any bleeding problems you have. ?Any surgeries you have had. ?Any medical conditions you have. ?Any problems you have had with having bowel movements. ?Whether you are pregnant or may be pregnant. ?What are the risks? ?Generally, this is a safe procedure. However, problems may occur, including: ?Bleeding. ?Damage to your intestine. ?Allergic reactions to medicines given during the procedure. ?Infection. This is rare. ?What happens before the procedure? ?Eating and drinking restrictions ?Follow instructions from your health care provider about eating or drinking restrictions, which may include: ?A few days before the procedure: ?Follow a low-fiber diet. ?Avoid nuts, seeds, dried fruit, raw fruits, and vegetables. ?1-3 days before the procedure: ?Eat only gelatin dessert or ice pops. ?Drink only clear liquids, such as water, clear juice, clear broth or bouillon, black coffee or tea, or clear soft drinks or sports drinks. ?Avoid liquids that contain red or purple dye. ?The day of the procedure: ?Do not eat solid foods. You may continue to drink clear liquids until up to 2 hours before the procedure. ?Do not eat or drink anything starting 2 hours before the procedure, or within the time period that your health care provider recommends. ?Bowel prep ?If you were prescribed a bowel prep to take by mouth (orally) to clean  out your colon: ?Take it as told by your health care provider. Starting the day before your procedure, you will need to drink a large amount of liquid medicine. The liquid will cause you to have many bowel movements of loose stool until your stool becomes almost clear or light green. ?If your skin or the opening between the buttocks (anus) gets irritated from diarrhea, you may relieve  the irritation using: ?Wipes with medicine in them, such as adult wet wipes with aloe and vitamin E. ?A product to soothe skin, such as petroleum jelly. ?If you vomit while drinking the bowel prep: ?Take a break for up to 60 minutes. ?Begin the bowel prep again. ?Call your health care provider if you keep vomiting or you cannot take the bowel prep without vomiting. ?To clean out your colon, you may also be given: ?Laxative medicines. These help you have a bowel movement. ?Instructions for enema use. An enema is liquid medicine injected into your rectum. ?Medicines ?Ask your health care provider about: ?Changing or stopping your regular medicines or supplements. This is especially important if you are taking iron supplements, diabetes medicines, or blood thinners. ?Taking medicines such as aspirin and ibuprofen. These medicines can thin your blood. Do not take these medicines unless your health care provider tells you to take them. ?Taking over-the-counter medicines, vitamins, herbs, and supplements. ?General instructions ?Ask your health care provider what steps will be taken to help prevent infection. These may include washing skin with a germ-killing soap. ?If you will be going home right after the procedure, plan to have a responsible adult: ?Take you home from the hospital or clinic. You will not be allowed to drive. ?Care for you for the time you are told. ?What happens during the procedure? ? ?An IV will be inserted into one of your veins. ?You will be given a medicine to make you fall asleep (general anesthetic). ?You will lie on your side with your knees bent. ?A lubricant will be put on the tube. Then the tube will be: ?Inserted into your anus. ?Gently eased through all parts of your large intestine. ?Air will be sent into your colon to keep it open. This may cause some pressure or cramping. ?Images will be taken with the camera and will appear on a screen. ?A small tissue sample may be removed to be looked  at under a microscope (biopsy). The tissue may be sent to a lab for testing if any signs of problems are found. ?If small polyps are found, they may be removed and checked for cancer cells. ?When the procedure is finished, the tube will be removed. ?The procedure may vary among health care providers and hospitals. ?What happens after the procedure? ?Your blood pressure, heart rate, breathing rate, and blood oxygen level will be monitored until you leave the hospital or clinic. ?You may have a small amount of blood in your stool. ?You may pass gas and have mild cramping or bloating in your abdomen. This is caused by the air that was used to open your colon during the exam. ?If you were given a sedative during the procedure, it can affect you for several hours. Do not drive or operate machinery until your health care provider says that it is safe. ?It is up to you to get the results of your procedure. Ask your health care provider, or the department that is doing the procedure, when your results will be ready. ?Summary ?A colonoscopy is a procedure to look at the entire  large intestine. ?Follow instructions from your health care provider about eating and drinking before the procedure. ?If you were prescribed an oral bowel prep to clean out your colon, take it as told by your health care provider. ?During the colonoscopy, a flexible tube with a camera on its end is inserted into the anus and then passed into all parts of the large intestine. ?This information is not intended to replace advice given to you by your health care provider. Make sure you discuss any questions you have with your health care provider. ?Document Revised: 05/24/2021 Document Reviewed: 01/20/2021 ?Elsevier Patient Education ? [place]. ? ?Monitored Anesthesia Care ?Anesthesia refers to techniques, procedures, and medicines that help a person stay safe and comfortable during a medical or dental procedure. Monitored anesthesia care, or  sedation, is one type of anesthesia. Your anesthesia specialist may recommend sedation if you will be having a procedure that does not require you to be unconscious. You may have this procedure for: ?[place]

## 2021-10-04 NOTE — Telephone Encounter (Signed)
Patient called with a medication question, please call her ?

## 2021-10-04 NOTE — Telephone Encounter (Signed)
Pt called stating that her kidney doctor put her on spironolactone 25 mg. She takes a half of a tablet once daily in the evening. Pt is wanting to know if she is to take that the evening before her procedure. Pt's procedure is on Thursday. Please advise.  ?

## 2021-10-05 ENCOUNTER — Encounter (HOSPITAL_COMMUNITY)
Admission: RE | Admit: 2021-10-05 | Discharge: 2021-10-05 | Disposition: A | Discharge status: Home / Self Care | Payer: PPO | Source: Ambulatory Visit | Attending: Internal Medicine | Admitting: Internal Medicine

## 2021-10-05 ENCOUNTER — Encounter (HOSPITAL_COMMUNITY): Payer: Self-pay

## 2021-10-05 VITALS — BP 184/84 | HR 94 | Temp 97.8°F | Resp 18 | Ht 66.0 in | Wt 223.0 lb

## 2021-10-05 DIAGNOSIS — E119 Type 2 diabetes mellitus without complications: Secondary | ICD-10-CM

## 2021-10-07 ENCOUNTER — Encounter (HOSPITAL_COMMUNITY)
Admission: RE | Disposition: A | Discharge status: Home / Self Care | Payer: Self-pay | Source: Home / Self Care | Attending: Internal Medicine

## 2021-10-07 ENCOUNTER — Ambulatory Visit (HOSPITAL_BASED_OUTPATIENT_CLINIC_OR_DEPARTMENT_OTHER): Payer: PPO | Admitting: Anesthesiology

## 2021-10-07 ENCOUNTER — Ambulatory Visit (HOSPITAL_COMMUNITY): Payer: PPO | Admitting: Anesthesiology

## 2021-10-07 ENCOUNTER — Ambulatory Visit (HOSPITAL_COMMUNITY)
Admission: RE | Admit: 2021-10-07 | Discharge: 2021-10-07 | Disposition: A | Discharge status: Home / Self Care | Payer: PPO | Attending: Internal Medicine | Admitting: Internal Medicine

## 2021-10-07 DIAGNOSIS — K641 Second degree hemorrhoids: Secondary | ICD-10-CM

## 2021-10-07 DIAGNOSIS — D509 Iron deficiency anemia, unspecified: Secondary | ICD-10-CM | POA: Diagnosis not present

## 2021-10-07 DIAGNOSIS — D124 Benign neoplasm of descending colon: Secondary | ICD-10-CM | POA: Insufficient documentation

## 2021-10-07 DIAGNOSIS — D120 Benign neoplasm of cecum: Secondary | ICD-10-CM | POA: Insufficient documentation

## 2021-10-07 DIAGNOSIS — Z86010 Personal history of colonic polyps: Secondary | ICD-10-CM | POA: Insufficient documentation

## 2021-10-07 DIAGNOSIS — I10 Essential (primary) hypertension: Secondary | ICD-10-CM | POA: Insufficient documentation

## 2021-10-07 DIAGNOSIS — K635 Polyp of colon: Secondary | ICD-10-CM

## 2021-10-07 DIAGNOSIS — Z7984 Long term (current) use of oral hypoglycemic drugs: Secondary | ICD-10-CM | POA: Diagnosis not present

## 2021-10-07 DIAGNOSIS — Z86711 Personal history of pulmonary embolism: Secondary | ICD-10-CM | POA: Insufficient documentation

## 2021-10-07 DIAGNOSIS — D122 Benign neoplasm of ascending colon: Secondary | ICD-10-CM | POA: Diagnosis not present

## 2021-10-07 DIAGNOSIS — E119 Type 2 diabetes mellitus without complications: Secondary | ICD-10-CM | POA: Insufficient documentation

## 2021-10-07 DIAGNOSIS — E1122 Type 2 diabetes mellitus with diabetic chronic kidney disease: Secondary | ICD-10-CM | POA: Diagnosis not present

## 2021-10-07 DIAGNOSIS — Z7901 Long term (current) use of anticoagulants: Secondary | ICD-10-CM | POA: Diagnosis not present

## 2021-10-07 DIAGNOSIS — N1832 Chronic kidney disease, stage 3b: Secondary | ICD-10-CM | POA: Diagnosis not present

## 2021-10-07 HISTORY — PX: COLONOSCOPY WITH PROPOFOL: SHX5780

## 2021-10-07 HISTORY — PX: POLYPECTOMY: SHX5525

## 2021-10-07 LAB — GLUCOSE, CAPILLARY: Glucose-Capillary: 111 mg/dL — ABNORMAL HIGH (ref 70–99)

## 2021-10-07 SURGERY — COLONOSCOPY WITH PROPOFOL
Anesthesia: General

## 2021-10-07 MED ORDER — LACTATED RINGERS IV SOLN: INTRAVENOUS | Status: DC

## 2021-10-07 MED ORDER — PROPOFOL 10 MG/ML IV BOLUS
INTRAVENOUS | Status: DC | PRN
Start: 2021-10-07 — End: 2021-10-07
  Administered 2021-10-07 (×2): 30 mg via INTRAVENOUS
  Administered 2021-10-07: 70 mg via INTRAVENOUS
  Administered 2021-10-07: 30 mg via INTRAVENOUS
  Administered 2021-10-07: 50 mg via INTRAVENOUS

## 2021-10-07 MED ORDER — STERILE WATER FOR IRRIGATION IR SOLN
Status: DC | PRN
  Administered 2021-10-07: 60 mL

## 2021-10-07 NOTE — Anesthesia Procedure Notes (Signed)
Date/Time: 10/07/2021 8:20 AM ?Performed by: Vista Deck, CRNA ?Pre-anesthesia Checklist: Patient identified, Emergency Drugs available, Suction available, Timeout performed and Patient being monitored ?Patient Re-evaluated:Patient Re-evaluated prior to induction ?Oxygen Delivery Method: Nasal Cannula ? ? ? ? ?

## 2021-10-07 NOTE — Transfer of Care (Signed)
Immediate Anesthesia Transfer of Care Note ? ?Patient: Lauren Hamilton ? ?Procedure(s) Performed: COLONOSCOPY WITH PROPOFOL ?POLYPECTOMY ? ?Patient Location: Short Stay ? ?Anesthesia Type:General ? ?Level of Consciousness: awake and patient cooperative ? ?Airway & Oxygen Therapy: Patient Spontanous Breathing ? ?Post-op Assessment: Report given to RN and Post -op Vital signs reviewed and stable ? ?Post vital signs: Reviewed and stable ? ?Last Vitals:  ?Vitals Value Taken Time  ?BP 101/62 10/07/21 0849  ?Temp 36.4 ?C 10/07/21 0849  ?Pulse 88   ?Resp 15 10/07/21 0849  ?SpO2 96 % 10/07/21 0849  ? ? ?Last Pain:  ?Vitals:  ? 10/07/21 0849  ?TempSrc: Oral  ?PainSc: 0-No pain  ?   ? ?Patients Stated Pain Goal: 7 (10/07/21 0746) ? ?Complications: No notable events documented. ?

## 2021-10-07 NOTE — Op Note (Signed)
[facility] ?Patient Name: Lauren Hamilton ?Procedure Date: 10/07/2021 7:14 AM ?MRN: [id number] ?Date of Birth: 04-Apr-1946 ?Attending MD: Norvel Richards , MD ?CSN: [id number] ?Age: 76 ?Admit Type: Outpatient ?Procedure:                Colonoscopy ?Indications:              Unexplained iron deficiency anemia ?Providers:                Norvel Richards, MD, Lambert Mody,  ?                          Kristine L. Risa Grill, Technician ?Referring MD:              ?Medicines:                Propofol per Anesthesia ?Complications:            No immediate complications. ?Estimated Blood Loss:     Estimated blood loss: none. ?Procedure:                Pre-Anesthesia Assessment: ?                          - Prior to the procedure, a History and Physical  ?                          was performed, and patient medications and  ?                          allergies were reviewed. The patient's tolerance of  ?                          previous anesthesia was also reviewed. The risks  ?                          and benefits of the procedure and the sedation  ?                          options and risks were discussed with the patient.  ?                          All questions were answered, and informed consent  ?                          was obtained. Prior Anticoagulants: The patient  ?                          last took Eliquis (apixaban) 2 days prior to the  ?                          procedure. ASA Grade Assessment: III - A patient  ?                          with severe systemic disease. After reviewing the  ?  risks and benefits, the patient was deemed in  ?                          satisfactory condition to undergo the procedure. ?                          After obtaining informed consent, the colonoscope  ?                          was passed under direct vision. Throughout the  ?                          procedure, the patient's blood pressure, pulse, and  ?                           oxygen saturations were monitored continuously. The  ?                          [id number] scope was introduced through the  ?                          anus and advanced to the the cecum, identified by  ?                          appendiceal orifice and ileocecal valve. The  ?                          colonoscopy was performed without difficulty. The  ?                          patient tolerated the procedure well. The quality  ?                          of the bowel preparation was adequate. ?Scope In: 8:27:54 AM ?Scope Out: 8:43:07 AM ?Scope Withdrawal Time: 0 hours 9 minutes 41 seconds  ?Total Procedure Duration: 0 hours 15 minutes 13 seconds  ?Findings: ?     Hemorrhoids were found on perianal exam. ?     Non-bleeding internal hemorrhoids were found during retroflexion. The  ?     hemorrhoids were moderate, medium-sized and Grade II (internal  ?     hemorrhoids that prolapse but reduce spontaneously). ?     Two sessile polyps were found in the descending colon and cecum. The  ?     polyps were 3 to 4 mm in size. These polyps were removed with a cold  ?     snare. Resection and retrieval were complete. Estimated blood loss was  ?     minimal. ?     The exam was otherwise without abnormality on direct and retroflexion  ?     views. ?Impression:               - Hemorrhoids found on perianal exam. ?                          - Non-bleeding internal hemorrhoids. . ?                          -  Two 3 to 4 mm polyps in the descending colon and  ?                          in the cecum, removed with a cold snare. Resected  ?                          and retrieved. ?                          - The examination was otherwise normal on direct  ?                          and retroflexion views. ?Moderate Sedation: ?     Moderate (conscious) sedation was personally administered by an  ?     anesthesia professional. The following parameters were monitored: oxygen  ?     saturation, heart rate, blood pressure, respiratory  rate, EKG, adequacy  ?     of pulmonary ventilation, and response to care. ?Recommendation:           - Patient has a contact number available for  ?                          emergencies. The signs and symptoms of potential  ?                          delayed complications were discussed with the  ?                          patient. Return to normal activities tomorrow.  ?                          Written discharge instructions were provided to the  ?                          patient. ?                          - Resume previous diet. Resume Eliquis today. ?                          - Repeat colonoscopy date to be determined after  ?                          pending pathology results are reviewed for  ?                          surveillance based on pathology results. ?                          - Return to GI office in 3 months. ?Procedure Code(s):        --- Professional --- ?                          [phone], Colonoscopy, flexible; with removal of  ?  tumor(s), polyp(s), or other lesion(s) by snare  ?                          technique ?Diagnosis Code(s):        --- Professional --- ?                          K64.1, Second degree hemorrhoids ?                          K63.5, Polyp of colon ?                          D50.9, Iron deficiency anemia, unspecified ?CPT copyright 2019 [employer] Medical Association. All rights reserved. ?The codes documented in this report are preliminary and upon coder review may  ?be revised to meet current compliance requirements. ?Cristopher Estimable. Pietra Zuluaga, MD ?Norvel Richards, MD ?10/07/2021 8:56:37 AM ?This report has been signed electronically. ?Number of Addenda: 0 ?

## 2021-10-07 NOTE — Anesthesia Preprocedure Evaluation (Addendum)
Anesthesia Evaluation  ?Patient identified by MRN, date of birth, ID band ?Patient awake ? ? ? ?Reviewed: ?Allergy & Precautions, NPO status , Patient's Chart, lab work & pertinent test results, reviewed documented beta blocker date and time  ? ?Airway ?Mallampati: III ? ?TM Distance: >3 FB ?Neck ROM: Full ? ? ? Dental ? ?(+) Dental Advisory Given, Caps ?  ?Pulmonary ?shortness of breath and with exertion, sleep apnea and Continuous Positive Airway Pressure Ventilation , PE ?  ?Pulmonary exam normal ?breath sounds clear to auscultation ? ? ? ? ? ? Cardiovascular ?hypertension, Pt. on medications and Pt. on home beta blockers ?Normal cardiovascular exam ?Rhythm:Regular Rate:Normal ? ? ?  ?Neuro/Psych ?negative neurological ROS ? negative psych ROS  ? GI/Hepatic ?negative GI ROS, Neg liver ROS,   ?Endo/Other  ?diabetes, Well Controlled, Type 2, Oral Hypoglycemic Agents ? Renal/GU ?Renal InsufficiencyRenal disease  ?negative genitourinary ?  ?Musculoskeletal ? ?(+) Arthritis , Osteoarthritis,   ? Abdominal ?  ?Peds ?negative pediatric ROS ?(+)  Hematology ? ?(+) Blood dyscrasia, anemia ,   ?Anesthesia Other Findings ? ? Reproductive/Obstetrics ?negative OB ROS ? ?  ? ? ? ? ? ? ? ? ? ? ? ? ? ?  ?  ? ? ? ? ? ? ?Anesthesia Physical ?Anesthesia Plan ? ?ASA: 3 ? ?Anesthesia Plan: General  ? ?Post-op Pain Management: Minimal or no pain anticipated  ? ?Induction: Intravenous ? ?PONV Risk Score and Plan: Propofol infusion ? ?Airway Management Planned: Natural Airway and Nasal Cannula ? ?Additional Equipment:  ? ?Intra-op Plan:  ? ?Post-operative Plan:  ? ?Informed Consent: I have reviewed the patients History and Physical, chart, labs and discussed the procedure including the risks, benefits and alternatives for the proposed anesthesia with the patient or authorized representative who has indicated his/her understanding and acceptance.  ? ? ? ?Dental advisory given ? ?Plan Discussed with: CRNA  and Surgeon ? ?Anesthesia Plan Comments:   ? ? ? ? ? ?Anesthesia Quick Evaluation ? ?

## 2021-10-07 NOTE — H&P (Signed)
$'@LOGO'G$ @ ? ? ?Primary Care Physician:  Monico Blitz, MD ?Primary Gastroenterologist:  Dr. Gala Romney ? ?Pre-Procedure History & Physical: ?HPI:  Lauren Hamilton is a 76 y.o. female here for diagnostic colonoscopy.  Iron deficiency anemia.  History of colonic adenomas removed 4 years ago. ?Eliquis held 2 days ago. ? ?Past Medical History:  ?Diagnosis Date  ? Arthritis   ? Bilateral pulmonary embolism ([place]) 10/2017  ? 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 Bronchitis   ? Cancer [facility])   ? Skin  ? Diabetes mellitus without complication ([place])   ? Gout   ? H/O colonoscopy with polypectomy   ? Hypercholesteremia   ? Hypertension   ? Renal disorder   ? Renal insufficiency   ? ? ?Past Surgical History:  ?Procedure Laterality Date  ? bilateral heel spur removed    ? carpel tunnel Bilateral   ? CHOLECYSTECTOMY    ? COLONOSCOPY  2013  ? [facility]: hyperplastic polyps. advised 5 year follow up colonoscopy.   ? COLONOSCOPY N/A 06/12/2018  ? [facility]: three 4-31m polyps at heptic flexure, one 72mpolyp at hepatic flexure all tubular adenomas, non bleeding external and internal hemorrhoids  ? ESOPHAGOGASTRODUODENOSCOPY (EGD) WITH PROPOFOL N/A 06/18/2021  ? Procedure: ESOPHAGOGASTRODUODENOSCOPY (EGD) WITH PROPOFOL;  Surgeon: CaHarvel QualeMD;  [facility];  Service: Gastroenterology;  Laterality: N/A;  ? KNEE ARTHROSCOPY Right   ? PARTIAL HYSTERECTOMY    ? POLYPECTOMY  06/12/2018  ? Procedure: POLYPECTOMY;  Surgeon: RoDaneil DolinMD;  [facility];  Service: Endoscopy;;  hep.flex  ? POLYPECTOMY  06/18/2021  ? Procedure: POLYPECTOMY;  Surgeon: CaHarvel QualeMD;  [facility];  Service: Gastroenterology;;  ? THYROIDECTOMY, PARTIAL    ? TONSILLECTOMY    ? ? ?Prior to Admission medications   ?Medication Sig Start Date End Date Taking? Authorizing Provider   ?allopurinol (ZYLOPRIM) 100 MG tablet Take 100 mg by mouth at bedtime.   Yes [provider]  ?apixaban (ELIQUIS) 5 MG TABS tablet Take 1 tablet (5 mg total) by mouth 2 (two) times daily. 06/21/21  Yes Johnson, Clanford L, MD  ?Artificial Tear Solution (SOOTHE XP) SOLN Apply 1-2 drops to eye as needed (dry eyes).    Yes [provider]  ?Ascorbic Acid (VITAMIN C PO) Take 1 tablet by mouth daily.   Yes [provider]  ?carvedilol (COREG) 6.25 MG tablet Take 1 tablet (6.25 mg total) by mouth 2 (two) times daily with a meal. 06/21/21  Yes Johnson, Clanford L, MD  ?dapagliflozin propanediol (FARXIGA) 5 MG TABS tablet Take 5 mg by mouth daily.   Yes [provider]  ?furosemide (LASIX) 20 MG tablet Take 20 mg by mouth daily. 09/01/21  Yes [provider]  ?levocetirizine (XYZAL) 5 MG tablet Take 2.5 mg by mouth every evening.   Yes [provider]  ?linagliptin (TRADJENTA) 5 MG TABS tablet Take 1 tablet (5 mg total) by mouth daily. 06/21/21  Yes Johnson, Clanford L, MD  ?losartan (COZAAR) 25 MG tablet Take 12.5 mg by mouth daily. 08/12/21  Yes [provider]  ?MAGNESIUM PO Take 1 tablet by mouth daily.   Yes [provider]  ?omeprazole (PRILOSEC) 40 MG capsule Take 40 mg by mouth daily.   Yes [provider]  ?rosuvastatin (CRESTOR) 10 MG tablet Take 1 tablet (10 mg total) by  mouth every evening. 06/21/21  Yes Johnson, Clanford L, MD  ?spironolactone (ALDACTONE) 25 MG tablet Take 12.5 mg by mouth daily. 09/23/21  Yes [provider]  ?Spirulina 500 MG TABS Take 2,000 mg by mouth daily.   Yes [provider]  ?SUMAtriptan (IMITREX) 100 MG tablet Take 100 mg by mouth every 2 (two) hours as needed for migraine. 07/07/19  Yes [provider]  ?polyethylene glycol-electrolytes (NULYTELY) 420 g solution As directed 08/19/21   Amany Rando, Cristopher Estimable, MD  ? ? ?Allergies as of 08/19/2021 - Review Complete 08/09/2021  ?Allergen Reaction Noted   ? Other Itching 10/13/2017  ? Latex Rash 06/15/2011  ? Clonidine derivatives  10/31/2019  ? Etodolac  04/12/2018  ? Hydralazine  03/17/2020  ? Hydrocodone Other (See Comments) 10/13/2017  ? Naproxen Other (See Comments) 11/10/2011  ? Statins Other (See Comments) 10/13/2017  ? Tramadol  10/20/2011  ? Amlodipine Cough 01/25/2018  ? Lisinopril Cough 01/25/2018  ? Losartan Cough 01/25/2018  ? ? ?Family History  ?Problem Relation Age of Onset  ? Allergic rhinitis Sister   ? Allergic rhinitis Brother   ? Hypertension Mother   ? Heart attack Father   ? Cirrhosis Father   ? Emphysema Father   ? Breast cancer Maternal Aunt   ? Colon cancer Neg Hx   ? ? ?Social History  ? ?Socioeconomic History  ? Marital status: Widowed  ?  Spouse name: Not on file  ? Number of children: Not on file  ? Years of education: Not on file  ? Highest education level: Not on file  ?Occupational History  ? Not on file  ?Tobacco Use  ? Smoking status: Never  ? Smokeless tobacco: Never  ?Vaping Use  ? Vaping Use: Never used  ?Substance and Sexual Activity  ? Alcohol use: Never  ? Drug use: Never  ? Sexual activity: Not Currently  ?Other Topics Concern  ? Not on file  ?Social History Narrative  ? Not on file  ? ?Social Determinants of Health  ? ?Financial Resource Strain: Not on file  ?Food Insecurity: Not on file  ?Transportation Needs: Not on file  ?Physical Activity: Not on file  ?Stress: Not on file  ?Social Connections: Not on file  ?Intimate Partner Violence: Not on file  ? ? ?Review of Systems: ?See HPI, otherwise negative ROS ? ?Physical Exam: ?BP (!) 139/104   Pulse 82   Temp 97.9 ?F (36.6 ?C) (Oral)   Resp 20   Ht '5\' 6"'$  (1.676 m)   Wt 101.2 kg   SpO2 91%   BMI 35.99 kg/m?  ?General:   Alert,  Well-developed, well-nourished, pleasant and cooperative in NAD ?Skin:  Intact without significant lesions or rashes. ?Eyes:  Sclera clear, no icterus.   Conjunctiva pink. ?Ears:  Normal auditory acuity. ?Nose:  No deformity, discharge,  or  lesions. ?Mouth:  No deformity or lesions. ?Neck:  Supple; no masses or thyromegaly. No significant cervical adenopathy. ?Lungs:  Clear throughout to auscultation.   No wheezes, crackles, or rhonchi. No acute distress. ?Heart:  Regular rate and rhythm; no murmurs, clicks, rubs,  or gallops. ?Abdomen: Non-distended, normal bowel sounds.  Soft and nontender without appreciable mass or hepatosplenomegaly.  ?Pulses:  Normal pulses noted. ?Extremities:  Without clubbing or edema. ? ?Impression/Plan: 76 year old lady with iron deficiency anemia, history of colonic adenomas.  Here for a colonoscopy per plan.  I have offered the patient a diagnostic colonoscopy today.The risks, benefits, limitations, alternatives and imponderables have been  reviewed with the patient. Questions have been answered. All parties are agreeable.   ? ? ? ? ?Notice: This dictation was prepared with Dragon dictation along with smaller phrase technology. Any transcriptional errors that result from this process are unintentional and may not be corrected upon review.  ? ?

## 2021-10-07 NOTE — Anesthesia Postprocedure Evaluation (Signed)
Anesthesia Post Note ? ?Patient: [name] ? ?Procedure(s) Performed: COLONOSCOPY WITH PROPOFOL ?POLYPECTOMY ? ?Patient location during evaluation: Phase II ?Anesthesia Type: General ?Level of consciousness: awake and alert and oriented ?Pain management: pain level controlled ?Vital Signs Assessment: post-procedure vital signs reviewed and stable ?Respiratory status: spontaneous breathing, nonlabored ventilation and respiratory function stable ?Cardiovascular status: blood pressure returned to baseline and stable ?Postop Assessment: no apparent nausea or vomiting ?Anesthetic complications: no ? ? ?No notable events documented. ? ? ?Last Vitals:  ?Vitals:  ? 10/07/21 0746 10/07/21 0849  ?BP: (!) 139/104 101/62  ?Pulse: 82   ?Resp: 20 15  ?Temp: 36.6 ?C 36.4 ?C  ?SpO2: 91% 96%  ?  ?Last Pain:  ?Vitals:  ? 10/07/21 0849  ?TempSrc: Oral  ?PainSc: 0-No pain  ? ? ?  ?  ?  ?  ?  ?  ? ?Josefa Syracuse C Karin Griffith ? ? ? ? ?

## 2021-10-07 NOTE — Discharge Instructions (Signed)
?  Colonoscopy ?Discharge Instructions ? ?Read the instructions outlined below and refer to this sheet in the next few weeks. These discharge instructions provide you with general information on caring for yourself after you leave the hospital. Your doctor may also give you specific instructions. While your treatment has been planned according to the most current medical practices available, unavoidable complications occasionally occur. If you have any problems or questions after discharge, call Dr. Gala Romney at [phone]. ?ACTIVITY ?You may resume your regular activity, but move at a slower pace for the next 24 hours.  ?Take frequent rest periods for the next 24 hours.  ?Walking will help get rid of the air and reduce the bloated feeling in your belly (abdomen).  ?No driving for 24 hours (because of the medicine (anesthesia) used during the test).   ?Do not sign any important legal documents or operate any machinery for 24 hours (because of the anesthesia used during the test).  ?NUTRITION ?Drink plenty of fluids.  ?You may resume your normal diet as instructed by your doctor.  ?Begin with a light meal and progress to your normal diet. Heavy or fried foods are harder to digest and may make you feel sick to your stomach (nauseated).  ?Avoid alcoholic beverages for 24 hours or as instructed.  ?MEDICATIONS ?You may resume your normal medications unless your doctor tells you otherwise.  ?WHAT YOU CAN EXPECT TODAY ?Some feelings of bloating in the abdomen.  ?Passage of more gas than usual.  ?Spotting of blood in your stool or on the toilet paper.  ?IF YOU HAD POLYPS REMOVED DURING THE COLONOSCOPY: ?No aspirin products for 7 days or as instructed.  ?No alcohol for 7 days or as instructed.  ?Eat a soft diet for the next 24 hours.  ?FINDING OUT THE RESULTS OF YOUR TEST ?Not all test results are available during your visit. If your test results are not back during the visit, make an appointment with your caregiver to find out the  results. Do not assume everything is normal if you have not heard from your caregiver or the medical facility. It is important for you to follow up on all of your test results.  ?SEEK IMMEDIATE MEDICAL ATTENTION IF: ?You have more than a spotting of blood in your stool.  ?Your belly is swollen (abdominal distention).  ?You are nauseated or vomiting.  ?You have a temperature over 101.  ?You have abdominal pain or discomfort that is severe or gets worse throughout the day.   ? ?2 polyps removed your colon today ? ?You have hemorrhoids. ? ?Hemorrhoid and colon polyp information provided ? ?Further recommendations to follow pending review of pathology report ? ?Follow-up appointment with Aliene Altes in our office in 3 months ? ?Resume Eliquis today ? ?At patient request, I called Despina Hidden at [phone] ? ?

## 2021-10-08 DIAGNOSIS — I5032 Chronic diastolic (congestive) heart failure: Secondary | ICD-10-CM | POA: Diagnosis not present

## 2021-10-08 DIAGNOSIS — E1122 Type 2 diabetes mellitus with diabetic chronic kidney disease: Secondary | ICD-10-CM | POA: Diagnosis not present

## 2021-10-08 DIAGNOSIS — N189 Chronic kidney disease, unspecified: Secondary | ICD-10-CM | POA: Diagnosis not present

## 2021-10-08 DIAGNOSIS — I129 Hypertensive chronic kidney disease with stage 1 through stage 4 chronic kidney disease, or unspecified chronic kidney disease: Secondary | ICD-10-CM | POA: Diagnosis not present

## 2021-10-08 DIAGNOSIS — E1129 Type 2 diabetes mellitus with other diabetic kidney complication: Secondary | ICD-10-CM | POA: Diagnosis not present

## 2021-10-08 DIAGNOSIS — E876 Hypokalemia: Secondary | ICD-10-CM | POA: Diagnosis not present

## 2021-10-08 DIAGNOSIS — R809 Proteinuria, unspecified: Secondary | ICD-10-CM | POA: Diagnosis not present

## 2021-10-08 LAB — SURGICAL PATHOLOGY

## 2021-10-08 NOTE — Telephone Encounter (Signed)
Sorry just seeing this and pt has already had her tcs.  ?

## 2021-10-09 ENCOUNTER — Encounter: Payer: Self-pay | Admitting: Internal Medicine

## 2021-10-12 ENCOUNTER — Encounter (HOSPITAL_COMMUNITY): Payer: Self-pay | Admitting: Internal Medicine

## 2021-10-13 DIAGNOSIS — Z9989 Dependence on other enabling machines and devices: Secondary | ICD-10-CM | POA: Diagnosis not present

## 2021-10-13 DIAGNOSIS — G4733 Obstructive sleep apnea (adult) (pediatric): Secondary | ICD-10-CM | POA: Diagnosis not present

## 2021-10-13 DIAGNOSIS — Z6835 Body mass index (BMI) 35.0-35.9, adult: Secondary | ICD-10-CM | POA: Diagnosis not present

## 2021-10-25 DIAGNOSIS — J329 Chronic sinusitis, unspecified: Secondary | ICD-10-CM | POA: Diagnosis not present

## 2021-10-25 DIAGNOSIS — R809 Proteinuria, unspecified: Secondary | ICD-10-CM | POA: Diagnosis not present

## 2021-10-25 DIAGNOSIS — E1129 Type 2 diabetes mellitus with other diabetic kidney complication: Secondary | ICD-10-CM | POA: Diagnosis not present

## 2021-10-25 DIAGNOSIS — G4733 Obstructive sleep apnea (adult) (pediatric): Secondary | ICD-10-CM | POA: Diagnosis not present

## 2021-10-25 DIAGNOSIS — Z299 Encounter for prophylactic measures, unspecified: Secondary | ICD-10-CM | POA: Diagnosis not present

## 2021-10-25 DIAGNOSIS — N184 Chronic kidney disease, stage 4 (severe): Secondary | ICD-10-CM | POA: Diagnosis not present

## 2021-11-03 DIAGNOSIS — I5032 Chronic diastolic (congestive) heart failure: Secondary | ICD-10-CM | POA: Diagnosis not present

## 2021-11-03 DIAGNOSIS — E1122 Type 2 diabetes mellitus with diabetic chronic kidney disease: Secondary | ICD-10-CM | POA: Diagnosis not present

## 2021-11-03 DIAGNOSIS — N189 Chronic kidney disease, unspecified: Secondary | ICD-10-CM | POA: Diagnosis not present

## 2021-11-03 DIAGNOSIS — E1129 Type 2 diabetes mellitus with other diabetic kidney complication: Secondary | ICD-10-CM | POA: Diagnosis not present

## 2021-11-03 DIAGNOSIS — E876 Hypokalemia: Secondary | ICD-10-CM | POA: Diagnosis not present

## 2021-11-03 DIAGNOSIS — R809 Proteinuria, unspecified: Secondary | ICD-10-CM | POA: Diagnosis not present

## 2021-11-03 DIAGNOSIS — I129 Hypertensive chronic kidney disease with stage 1 through stage 4 chronic kidney disease, or unspecified chronic kidney disease: Secondary | ICD-10-CM | POA: Diagnosis not present

## 2021-11-04 DIAGNOSIS — I129 Hypertensive chronic kidney disease with stage 1 through stage 4 chronic kidney disease, or unspecified chronic kidney disease: Secondary | ICD-10-CM | POA: Diagnosis not present

## 2021-11-04 DIAGNOSIS — D649 Anemia, unspecified: Secondary | ICD-10-CM | POA: Diagnosis not present

## 2021-11-04 DIAGNOSIS — N189 Chronic kidney disease, unspecified: Secondary | ICD-10-CM | POA: Diagnosis not present

## 2021-11-04 DIAGNOSIS — E1129 Type 2 diabetes mellitus with other diabetic kidney complication: Secondary | ICD-10-CM | POA: Diagnosis not present

## 2021-11-04 DIAGNOSIS — R809 Proteinuria, unspecified: Secondary | ICD-10-CM | POA: Diagnosis not present

## 2021-11-04 DIAGNOSIS — E1122 Type 2 diabetes mellitus with diabetic chronic kidney disease: Secondary | ICD-10-CM | POA: Diagnosis not present

## 2021-11-04 DIAGNOSIS — I5032 Chronic diastolic (congestive) heart failure: Secondary | ICD-10-CM | POA: Diagnosis not present

## 2021-11-04 DIAGNOSIS — D508 Other iron deficiency anemias: Secondary | ICD-10-CM | POA: Diagnosis not present

## 2021-11-10 DIAGNOSIS — E7800 Pure hypercholesterolemia, unspecified: Secondary | ICD-10-CM | POA: Diagnosis not present

## 2021-11-10 DIAGNOSIS — I10 Essential (primary) hypertension: Secondary | ICD-10-CM | POA: Diagnosis not present

## 2021-11-10 DIAGNOSIS — G252 Other specified forms of tremor: Secondary | ICD-10-CM | POA: Diagnosis not present

## 2021-11-10 DIAGNOSIS — I809 Phlebitis and thrombophlebitis of unspecified site: Secondary | ICD-10-CM | POA: Diagnosis not present

## 2021-11-11 DIAGNOSIS — R809 Proteinuria, unspecified: Secondary | ICD-10-CM | POA: Diagnosis not present

## 2021-11-11 DIAGNOSIS — E1122 Type 2 diabetes mellitus with diabetic chronic kidney disease: Secondary | ICD-10-CM | POA: Diagnosis not present

## 2021-11-11 DIAGNOSIS — N189 Chronic kidney disease, unspecified: Secondary | ICD-10-CM | POA: Diagnosis not present

## 2021-11-11 DIAGNOSIS — E1129 Type 2 diabetes mellitus with other diabetic kidney complication: Secondary | ICD-10-CM | POA: Diagnosis not present

## 2021-11-11 DIAGNOSIS — D649 Anemia, unspecified: Secondary | ICD-10-CM | POA: Diagnosis not present

## 2021-11-11 DIAGNOSIS — I129 Hypertensive chronic kidney disease with stage 1 through stage 4 chronic kidney disease, or unspecified chronic kidney disease: Secondary | ICD-10-CM | POA: Diagnosis not present

## 2021-11-11 DIAGNOSIS — I5032 Chronic diastolic (congestive) heart failure: Secondary | ICD-10-CM | POA: Diagnosis not present

## 2021-11-11 HISTORY — PX: TOTAL KNEE ARTHROPLASTY: SHX125

## 2021-11-18 DIAGNOSIS — L84 Corns and callosities: Secondary | ICD-10-CM | POA: Diagnosis not present

## 2021-11-18 DIAGNOSIS — E1140 Type 2 diabetes mellitus with diabetic neuropathy, unspecified: Secondary | ICD-10-CM | POA: Diagnosis not present

## 2021-11-19 DIAGNOSIS — R7989 Other specified abnormal findings of blood chemistry: Secondary | ICD-10-CM | POA: Diagnosis not present

## 2021-11-19 DIAGNOSIS — M1712 Unilateral primary osteoarthritis, left knee: Secondary | ICD-10-CM | POA: Diagnosis not present

## 2021-11-22 DIAGNOSIS — M1712 Unilateral primary osteoarthritis, left knee: Secondary | ICD-10-CM | POA: Diagnosis not present

## 2021-11-25 DIAGNOSIS — G4733 Obstructive sleep apnea (adult) (pediatric): Secondary | ICD-10-CM | POA: Diagnosis not present

## 2021-11-29 DIAGNOSIS — M1712 Unilateral primary osteoarthritis, left knee: Secondary | ICD-10-CM | POA: Diagnosis not present

## 2021-11-29 DIAGNOSIS — G4733 Obstructive sleep apnea (adult) (pediatric): Secondary | ICD-10-CM | POA: Diagnosis not present

## 2021-11-29 DIAGNOSIS — Z7984 Long term (current) use of oral hypoglycemic drugs: Secondary | ICD-10-CM | POA: Diagnosis not present

## 2021-11-29 DIAGNOSIS — G8918 Other acute postprocedural pain: Secondary | ICD-10-CM | POA: Diagnosis not present

## 2021-11-29 DIAGNOSIS — Z7901 Long term (current) use of anticoagulants: Secondary | ICD-10-CM | POA: Diagnosis not present

## 2021-11-29 DIAGNOSIS — D509 Iron deficiency anemia, unspecified: Secondary | ICD-10-CM | POA: Diagnosis not present

## 2021-11-29 DIAGNOSIS — Z471 Aftercare following joint replacement surgery: Secondary | ICD-10-CM | POA: Diagnosis not present

## 2021-11-29 DIAGNOSIS — Z9181 History of falling: Secondary | ICD-10-CM | POA: Diagnosis not present

## 2021-11-29 DIAGNOSIS — Z86711 Personal history of pulmonary embolism: Secondary | ICD-10-CM | POA: Diagnosis not present

## 2021-11-29 DIAGNOSIS — E785 Hyperlipidemia, unspecified: Secondary | ICD-10-CM | POA: Diagnosis not present

## 2021-11-29 DIAGNOSIS — K219 Gastro-esophageal reflux disease without esophagitis: Secondary | ICD-10-CM | POA: Diagnosis not present

## 2021-11-29 DIAGNOSIS — N183 Chronic kidney disease, stage 3 unspecified: Secondary | ICD-10-CM | POA: Diagnosis not present

## 2021-11-29 DIAGNOSIS — M7051 Other bursitis of knee, right knee: Secondary | ICD-10-CM | POA: Diagnosis not present

## 2021-11-29 DIAGNOSIS — Z96652 Presence of left artificial knee joint: Secondary | ICD-10-CM | POA: Diagnosis not present

## 2021-11-29 DIAGNOSIS — I129 Hypertensive chronic kidney disease with stage 1 through stage 4 chronic kidney disease, or unspecified chronic kidney disease: Secondary | ICD-10-CM | POA: Diagnosis not present

## 2021-11-29 DIAGNOSIS — E1122 Type 2 diabetes mellitus with diabetic chronic kidney disease: Secondary | ICD-10-CM | POA: Diagnosis not present

## 2021-11-29 DIAGNOSIS — Z79899 Other long term (current) drug therapy: Secondary | ICD-10-CM | POA: Diagnosis not present

## 2021-11-29 DIAGNOSIS — G43909 Migraine, unspecified, not intractable, without status migrainosus: Secondary | ICD-10-CM | POA: Diagnosis not present

## 2021-11-29 DIAGNOSIS — E039 Hypothyroidism, unspecified: Secondary | ICD-10-CM | POA: Diagnosis not present

## 2021-11-29 DIAGNOSIS — Z96651 Presence of right artificial knee joint: Secondary | ICD-10-CM | POA: Diagnosis not present

## 2021-11-29 DIAGNOSIS — F32A Depression, unspecified: Secondary | ICD-10-CM | POA: Diagnosis not present

## 2021-12-02 DIAGNOSIS — G8929 Other chronic pain: Secondary | ICD-10-CM | POA: Diagnosis not present

## 2021-12-02 DIAGNOSIS — F32A Depression, unspecified: Secondary | ICD-10-CM | POA: Diagnosis not present

## 2021-12-02 DIAGNOSIS — M48062 Spinal stenosis, lumbar region with neurogenic claudication: Secondary | ICD-10-CM | POA: Diagnosis not present

## 2021-12-02 DIAGNOSIS — Z96652 Presence of left artificial knee joint: Secondary | ICD-10-CM | POA: Diagnosis not present

## 2021-12-02 DIAGNOSIS — Z96651 Presence of right artificial knee joint: Secondary | ICD-10-CM | POA: Diagnosis not present

## 2021-12-02 DIAGNOSIS — I129 Hypertensive chronic kidney disease with stage 1 through stage 4 chronic kidney disease, or unspecified chronic kidney disease: Secondary | ICD-10-CM | POA: Diagnosis not present

## 2021-12-02 DIAGNOSIS — G4733 Obstructive sleep apnea (adult) (pediatric): Secondary | ICD-10-CM | POA: Diagnosis not present

## 2021-12-02 DIAGNOSIS — M5416 Radiculopathy, lumbar region: Secondary | ICD-10-CM | POA: Diagnosis not present

## 2021-12-02 DIAGNOSIS — D631 Anemia in chronic kidney disease: Secondary | ICD-10-CM | POA: Diagnosis not present

## 2021-12-02 DIAGNOSIS — N183 Chronic kidney disease, stage 3 unspecified: Secondary | ICD-10-CM | POA: Diagnosis not present

## 2021-12-02 DIAGNOSIS — E1122 Type 2 diabetes mellitus with diabetic chronic kidney disease: Secondary | ICD-10-CM | POA: Diagnosis not present

## 2021-12-02 DIAGNOSIS — E785 Hyperlipidemia, unspecified: Secondary | ICD-10-CM | POA: Diagnosis not present

## 2021-12-02 DIAGNOSIS — Z86711 Personal history of pulmonary embolism: Secondary | ICD-10-CM | POA: Diagnosis not present

## 2021-12-02 DIAGNOSIS — M25552 Pain in left hip: Secondary | ICD-10-CM | POA: Diagnosis not present

## 2021-12-02 DIAGNOSIS — Z471 Aftercare following joint replacement surgery: Secondary | ICD-10-CM | POA: Diagnosis not present

## 2021-12-02 DIAGNOSIS — E039 Hypothyroidism, unspecified: Secondary | ICD-10-CM | POA: Diagnosis not present

## 2021-12-02 DIAGNOSIS — D509 Iron deficiency anemia, unspecified: Secondary | ICD-10-CM | POA: Diagnosis not present

## 2021-12-02 DIAGNOSIS — M5441 Lumbago with sciatica, right side: Secondary | ICD-10-CM | POA: Diagnosis not present

## 2021-12-02 DIAGNOSIS — K219 Gastro-esophageal reflux disease without esophagitis: Secondary | ICD-10-CM | POA: Diagnosis not present

## 2021-12-02 DIAGNOSIS — M5442 Lumbago with sciatica, left side: Secondary | ICD-10-CM | POA: Diagnosis not present

## 2021-12-02 DIAGNOSIS — G43909 Migraine, unspecified, not intractable, without status migrainosus: Secondary | ICD-10-CM | POA: Diagnosis not present

## 2021-12-02 DIAGNOSIS — Z7901 Long term (current) use of anticoagulants: Secondary | ICD-10-CM | POA: Diagnosis not present

## 2021-12-02 DIAGNOSIS — Z7984 Long term (current) use of oral hypoglycemic drugs: Secondary | ICD-10-CM | POA: Diagnosis not present

## 2021-12-02 DIAGNOSIS — Z9181 History of falling: Secondary | ICD-10-CM | POA: Diagnosis not present

## 2021-12-03 IMAGING — US US RENAL
1 series · 14 of 25 positions shown · non-contrast
Comparison: June 17, 2019

CLINICAL DATA: Acute kidney failure with lesion of tubular necrosis
(HCC)

EXAM:
RENAL / URINARY TRACT ULTRASOUND COMPLETE

[Series 1: us renal · 0.23mm/px · 14 of 54 slices shown]
[im 1/54]
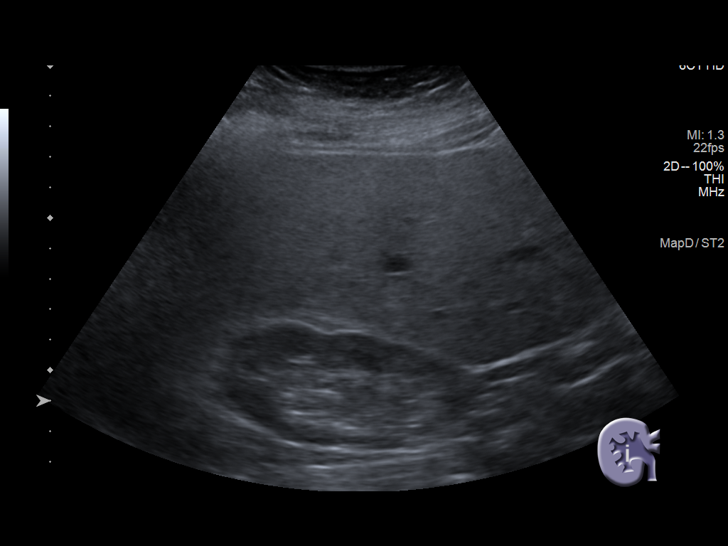
[im 5/54]
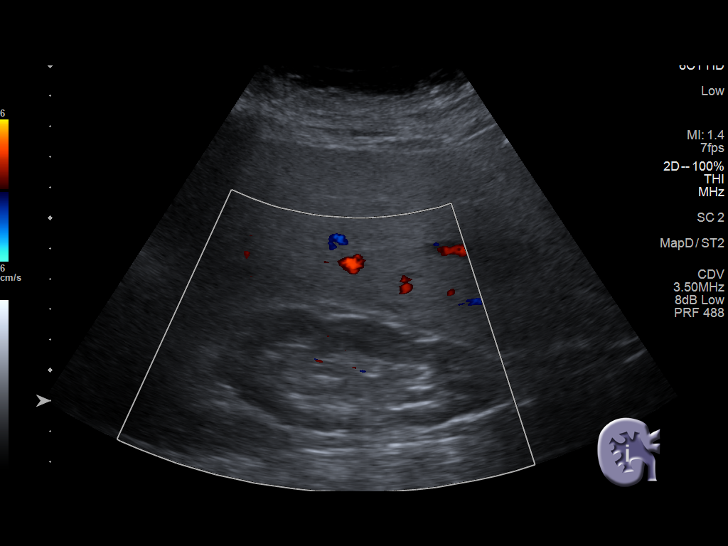
[im 9/54]
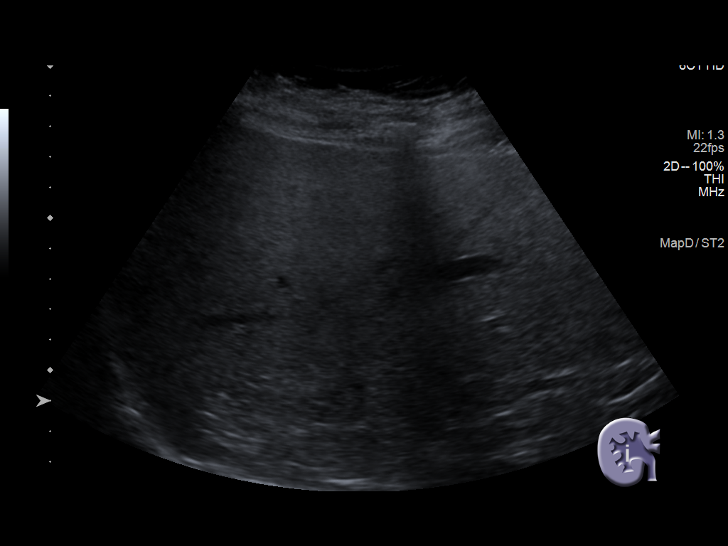
[im 14/54]
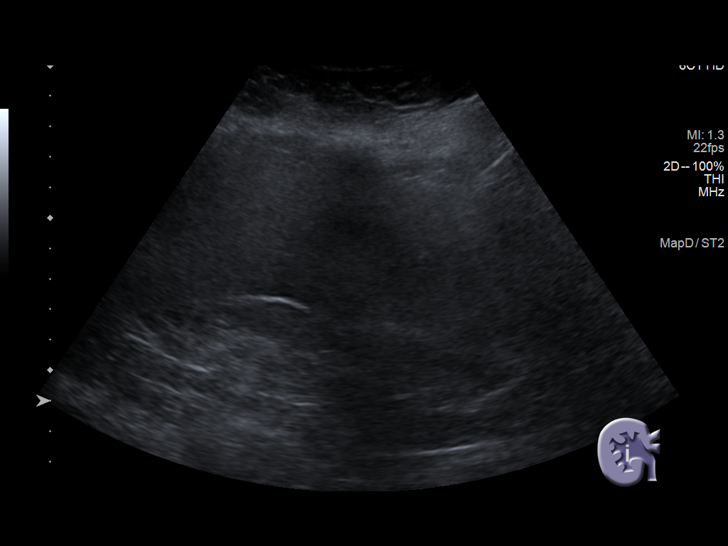
[im 18/54]
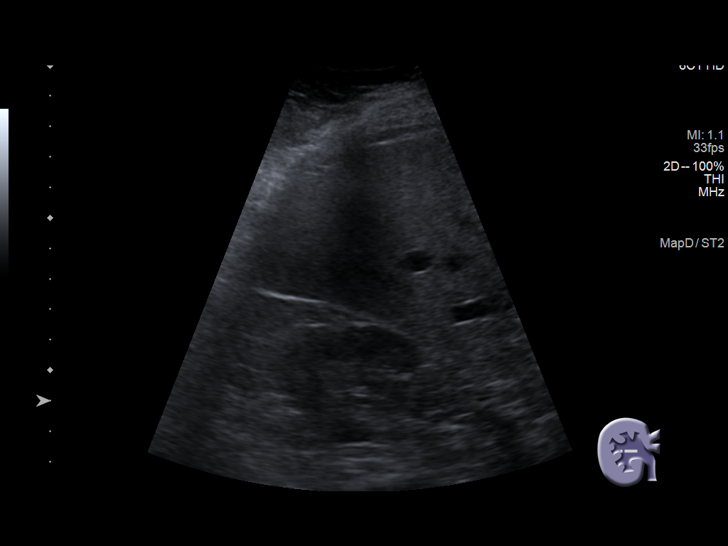
[im 20/54]
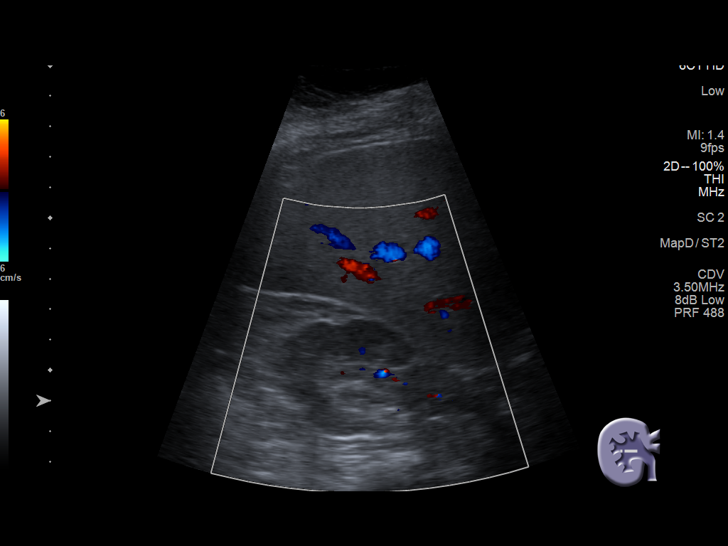
[im 25/54]
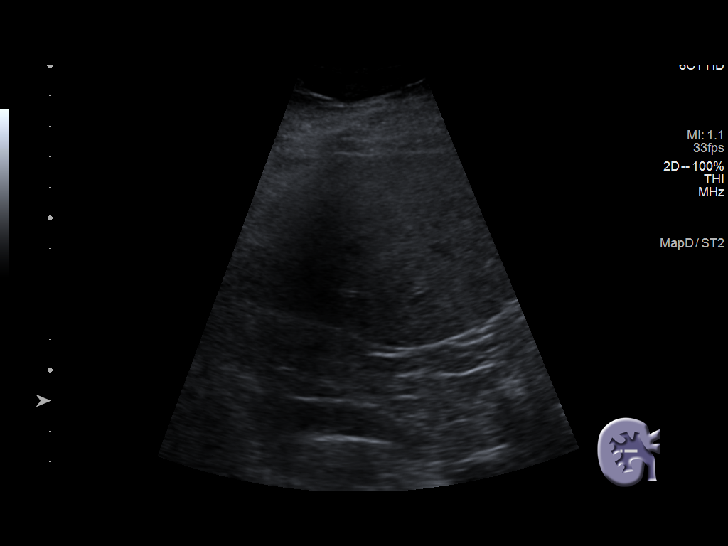
[im 29/54]
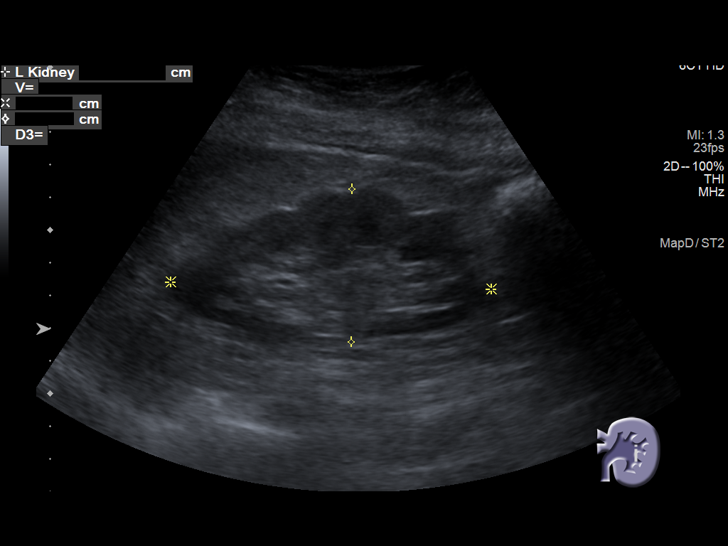
[im 34/54]
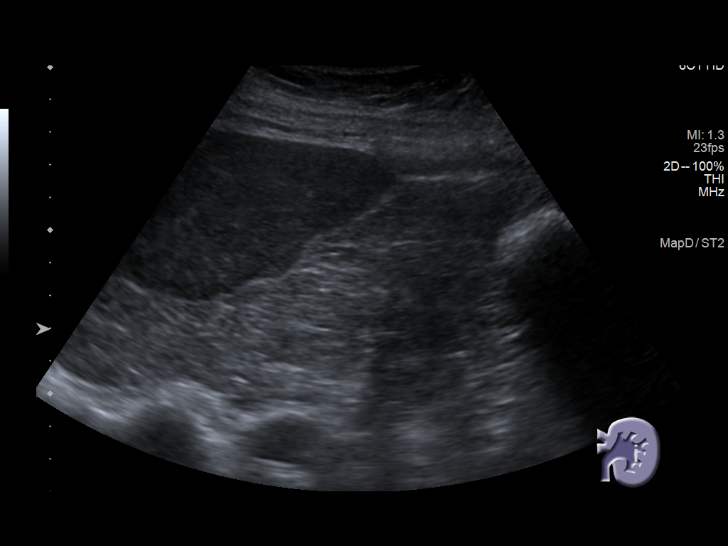
[im 36/54]
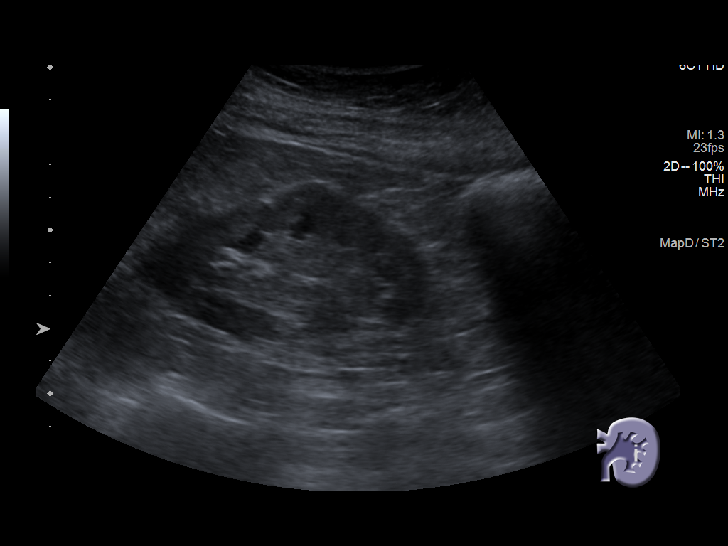
[im 40/54]
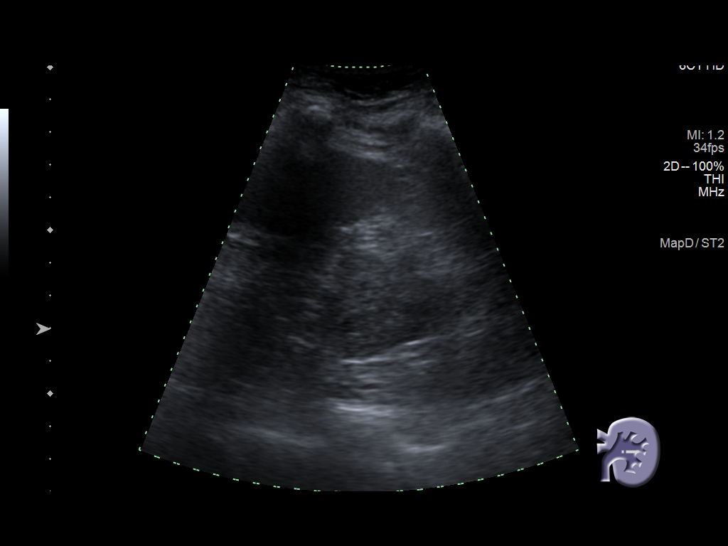
[im 45/54]
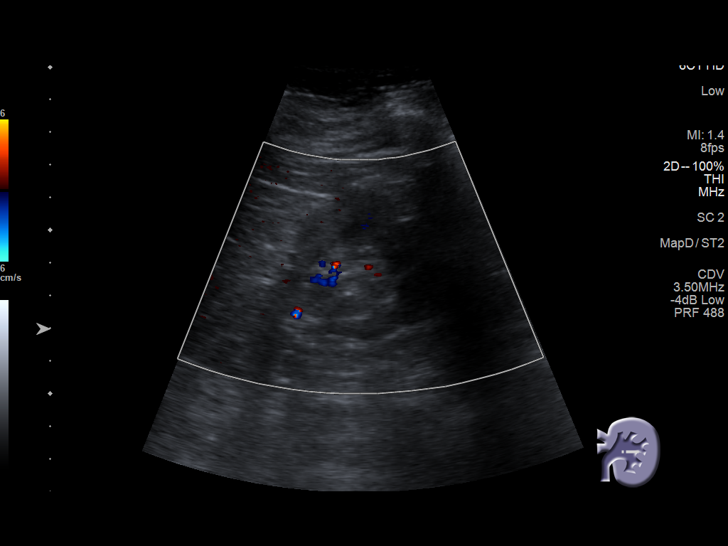
[im 49/54]
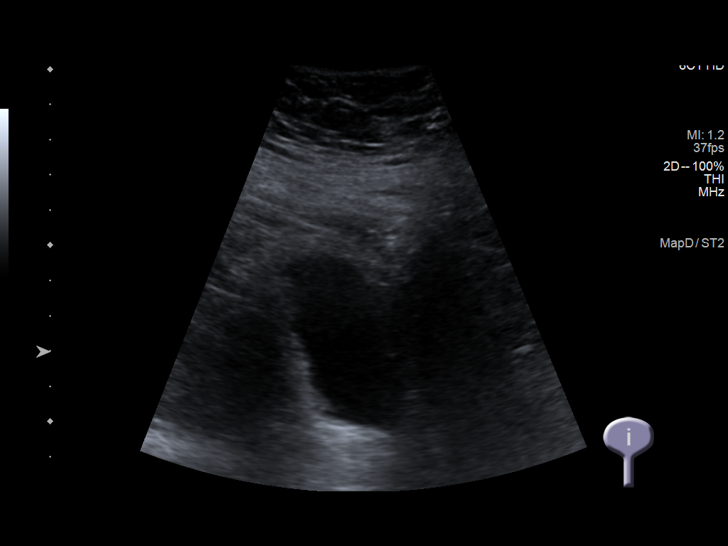
[im 54/54]
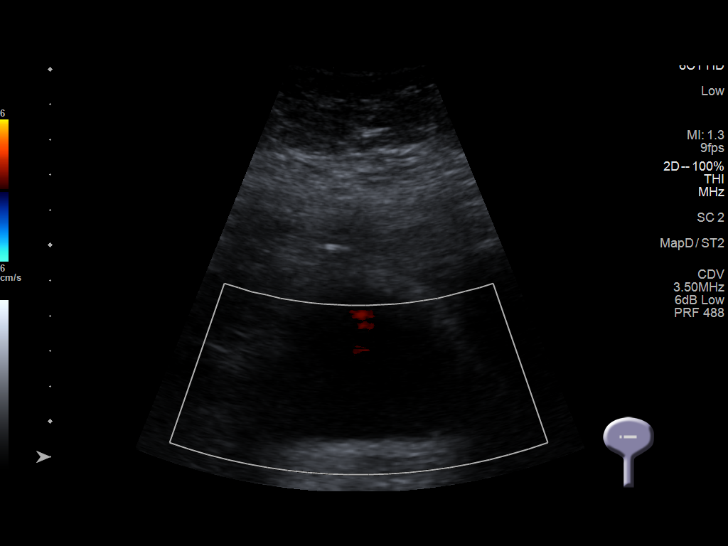

[14 of 25 positions shown; findings below may reference images not displayed]

FINDINGS: Right Kidney:

Renal measurements: 9.8 x 3.8 x 4.0 cm = volume: 79 mL. Echogenicity
within normal limits. No mass or hydronephrosis visualized.

Left Kidney:

Renal measurements: 9.8 x 4.7 x 4.8 cm = volume: 114 mL.
Echogenicity within normal limits. No mass or hydronephrosis
visualized. Cortical thinning of the superior pole.

Bladder:

Appears normal for degree of bladder distention.

Other:

Increased liver echogenicity.
IMPRESSION: 1. No hydronephrosis.
2. Increased liver echogenicity likely reflects hepatic steatosis.

## 2021-12-10 DIAGNOSIS — I809 Phlebitis and thrombophlebitis of unspecified site: Secondary | ICD-10-CM | POA: Diagnosis not present

## 2021-12-10 DIAGNOSIS — E7800 Pure hypercholesterolemia, unspecified: Secondary | ICD-10-CM | POA: Diagnosis not present

## 2021-12-10 DIAGNOSIS — I10 Essential (primary) hypertension: Secondary | ICD-10-CM | POA: Diagnosis not present

## 2021-12-10 DIAGNOSIS — G252 Other specified forms of tremor: Secondary | ICD-10-CM | POA: Diagnosis not present

## 2021-12-13 DIAGNOSIS — N183 Chronic kidney disease, stage 3 unspecified: Secondary | ICD-10-CM | POA: Diagnosis not present

## 2021-12-13 DIAGNOSIS — E1122 Type 2 diabetes mellitus with diabetic chronic kidney disease: Secondary | ICD-10-CM | POA: Diagnosis not present

## 2021-12-13 DIAGNOSIS — E039 Hypothyroidism, unspecified: Secondary | ICD-10-CM | POA: Diagnosis not present

## 2021-12-13 DIAGNOSIS — Z9181 History of falling: Secondary | ICD-10-CM | POA: Diagnosis not present

## 2021-12-13 DIAGNOSIS — Z7984 Long term (current) use of oral hypoglycemic drugs: Secondary | ICD-10-CM | POA: Diagnosis not present

## 2021-12-13 DIAGNOSIS — Z471 Aftercare following joint replacement surgery: Secondary | ICD-10-CM | POA: Diagnosis not present

## 2021-12-13 DIAGNOSIS — Z6835 Body mass index (BMI) 35.0-35.9, adult: Secondary | ICD-10-CM | POA: Diagnosis not present

## 2021-12-13 DIAGNOSIS — M25552 Pain in left hip: Secondary | ICD-10-CM | POA: Diagnosis not present

## 2021-12-13 DIAGNOSIS — M5416 Radiculopathy, lumbar region: Secondary | ICD-10-CM | POA: Diagnosis not present

## 2021-12-13 DIAGNOSIS — E1165 Type 2 diabetes mellitus with hyperglycemia: Secondary | ICD-10-CM | POA: Diagnosis not present

## 2021-12-13 DIAGNOSIS — Z299 Encounter for prophylactic measures, unspecified: Secondary | ICD-10-CM | POA: Diagnosis not present

## 2021-12-13 DIAGNOSIS — G4733 Obstructive sleep apnea (adult) (pediatric): Secondary | ICD-10-CM | POA: Diagnosis not present

## 2021-12-13 DIAGNOSIS — E785 Hyperlipidemia, unspecified: Secondary | ICD-10-CM | POA: Diagnosis not present

## 2021-12-13 DIAGNOSIS — F32A Depression, unspecified: Secondary | ICD-10-CM | POA: Diagnosis not present

## 2021-12-13 DIAGNOSIS — Z86711 Personal history of pulmonary embolism: Secondary | ICD-10-CM | POA: Diagnosis not present

## 2021-12-13 DIAGNOSIS — K219 Gastro-esophageal reflux disease without esophagitis: Secondary | ICD-10-CM | POA: Diagnosis not present

## 2021-12-13 DIAGNOSIS — I129 Hypertensive chronic kidney disease with stage 1 through stage 4 chronic kidney disease, or unspecified chronic kidney disease: Secondary | ICD-10-CM | POA: Diagnosis not present

## 2021-12-13 DIAGNOSIS — G43909 Migraine, unspecified, not intractable, without status migrainosus: Secondary | ICD-10-CM | POA: Diagnosis not present

## 2021-12-13 DIAGNOSIS — M5442 Lumbago with sciatica, left side: Secondary | ICD-10-CM | POA: Diagnosis not present

## 2021-12-13 DIAGNOSIS — I2699 Other pulmonary embolism without acute cor pulmonale: Secondary | ICD-10-CM | POA: Diagnosis not present

## 2021-12-13 DIAGNOSIS — D631 Anemia in chronic kidney disease: Secondary | ICD-10-CM | POA: Diagnosis not present

## 2021-12-13 DIAGNOSIS — D509 Iron deficiency anemia, unspecified: Secondary | ICD-10-CM | POA: Diagnosis not present

## 2021-12-13 DIAGNOSIS — Z7901 Long term (current) use of anticoagulants: Secondary | ICD-10-CM | POA: Diagnosis not present

## 2021-12-13 DIAGNOSIS — I10 Essential (primary) hypertension: Secondary | ICD-10-CM | POA: Diagnosis not present

## 2021-12-13 DIAGNOSIS — M48062 Spinal stenosis, lumbar region with neurogenic claudication: Secondary | ICD-10-CM | POA: Diagnosis not present

## 2021-12-13 DIAGNOSIS — Z96651 Presence of right artificial knee joint: Secondary | ICD-10-CM | POA: Diagnosis not present

## 2021-12-13 DIAGNOSIS — G8929 Other chronic pain: Secondary | ICD-10-CM | POA: Diagnosis not present

## 2021-12-13 DIAGNOSIS — M5441 Lumbago with sciatica, right side: Secondary | ICD-10-CM | POA: Diagnosis not present

## 2021-12-13 DIAGNOSIS — Z96652 Presence of left artificial knee joint: Secondary | ICD-10-CM | POA: Diagnosis not present

## 2021-12-22 DIAGNOSIS — Z96652 Presence of left artificial knee joint: Secondary | ICD-10-CM | POA: Diagnosis not present

## 2021-12-22 DIAGNOSIS — G4733 Obstructive sleep apnea (adult) (pediatric): Secondary | ICD-10-CM | POA: Diagnosis not present

## 2021-12-25 DIAGNOSIS — G4733 Obstructive sleep apnea (adult) (pediatric): Secondary | ICD-10-CM | POA: Diagnosis not present

## 2021-12-28 DIAGNOSIS — G43909 Migraine, unspecified, not intractable, without status migrainosus: Secondary | ICD-10-CM | POA: Diagnosis not present

## 2021-12-28 DIAGNOSIS — Z96651 Presence of right artificial knee joint: Secondary | ICD-10-CM | POA: Diagnosis not present

## 2021-12-28 DIAGNOSIS — E785 Hyperlipidemia, unspecified: Secondary | ICD-10-CM | POA: Diagnosis not present

## 2021-12-28 DIAGNOSIS — E1122 Type 2 diabetes mellitus with diabetic chronic kidney disease: Secondary | ICD-10-CM | POA: Diagnosis not present

## 2021-12-28 DIAGNOSIS — Z7901 Long term (current) use of anticoagulants: Secondary | ICD-10-CM | POA: Diagnosis not present

## 2021-12-28 DIAGNOSIS — F32A Depression, unspecified: Secondary | ICD-10-CM | POA: Diagnosis not present

## 2021-12-28 DIAGNOSIS — M5441 Lumbago with sciatica, right side: Secondary | ICD-10-CM | POA: Diagnosis not present

## 2021-12-28 DIAGNOSIS — N183 Chronic kidney disease, stage 3 unspecified: Secondary | ICD-10-CM | POA: Diagnosis not present

## 2021-12-28 DIAGNOSIS — Z7984 Long term (current) use of oral hypoglycemic drugs: Secondary | ICD-10-CM | POA: Diagnosis not present

## 2021-12-28 DIAGNOSIS — E039 Hypothyroidism, unspecified: Secondary | ICD-10-CM | POA: Diagnosis not present

## 2021-12-28 DIAGNOSIS — Z96652 Presence of left artificial knee joint: Secondary | ICD-10-CM | POA: Diagnosis not present

## 2021-12-28 DIAGNOSIS — M5416 Radiculopathy, lumbar region: Secondary | ICD-10-CM | POA: Diagnosis not present

## 2021-12-28 DIAGNOSIS — M5442 Lumbago with sciatica, left side: Secondary | ICD-10-CM | POA: Diagnosis not present

## 2021-12-28 DIAGNOSIS — D631 Anemia in chronic kidney disease: Secondary | ICD-10-CM | POA: Diagnosis not present

## 2021-12-28 DIAGNOSIS — G8929 Other chronic pain: Secondary | ICD-10-CM | POA: Diagnosis not present

## 2021-12-28 DIAGNOSIS — M48062 Spinal stenosis, lumbar region with neurogenic claudication: Secondary | ICD-10-CM | POA: Diagnosis not present

## 2021-12-28 DIAGNOSIS — D509 Iron deficiency anemia, unspecified: Secondary | ICD-10-CM | POA: Diagnosis not present

## 2021-12-28 DIAGNOSIS — Z471 Aftercare following joint replacement surgery: Secondary | ICD-10-CM | POA: Diagnosis not present

## 2021-12-28 DIAGNOSIS — K219 Gastro-esophageal reflux disease without esophagitis: Secondary | ICD-10-CM | POA: Diagnosis not present

## 2021-12-28 DIAGNOSIS — I129 Hypertensive chronic kidney disease with stage 1 through stage 4 chronic kidney disease, or unspecified chronic kidney disease: Secondary | ICD-10-CM | POA: Diagnosis not present

## 2021-12-28 DIAGNOSIS — G4733 Obstructive sleep apnea (adult) (pediatric): Secondary | ICD-10-CM | POA: Diagnosis not present

## 2021-12-28 DIAGNOSIS — Z9181 History of falling: Secondary | ICD-10-CM | POA: Diagnosis not present

## 2021-12-28 DIAGNOSIS — M25552 Pain in left hip: Secondary | ICD-10-CM | POA: Diagnosis not present

## 2021-12-28 DIAGNOSIS — Z86711 Personal history of pulmonary embolism: Secondary | ICD-10-CM | POA: Diagnosis not present

## 2022-01-04 ENCOUNTER — Encounter: Payer: Self-pay | Admitting: Gastroenterology

## 2022-01-04 NOTE — Progress Notes (Unsigned)
Referring Provider: Monico Blitz, MD Primary Care Physician:  Monico Blitz, MD Primary GI Physician: Dr. Gala Romney  No chief complaint on file.   HPI:   Lauren Hamilton is a 76 y.o. female presenting today  for follow-up of anemia and abnormal LFTs ***. She has history significant for  IDA, diabetes, hypertension, chronic bronchitis, PEs on chronic anticoagulation (Eliquis), gout, chronic kidney disease stage IIIb.    Admitted in January 2023. She presented with increased shortness of breath, swelling, fatigue.  In the ED her hemoglobin was 10.6, heme positive stool.  LFTs elevated with AST of 177, ALT 117.  Her BUN was 129, creatinine 5.83, ferritin 177, iron saturations 13%, serum iron 58, TIBC 445, B12 and folate normal.   EGD completed during admission with 2cm hiatal hernia and multiple gastric polyps (3 were removed). Pathology with hyperplastic polyp.    LFT workup during admission included: negative AMA, ASMA, immunoglobulins unremarkable, alpha-1 antitrypsin level normal, acute hepatitis panel negative, hemochromatosis all negative. AMA 20.8, equivocal, and felt PBC less likely with normal Alk Phos, but would need to be followed. She had been on recent antibiotics for sinusitis but was unsure of the name of the medication.  Also taking slippery elm for constipation.  Abdominal ultrasound showed slightly lobular liver contour with increased parenchymal echogenicity, suggestive of hepatic steatosis plus or minus cirrhosis.  Spleen normal in size. Overall, felt she likely had drug induced liver injury.   She was seen via virtual visit on 08/09/21. Denied overt GI bleeding. Reported eating gummy bears to keep bowels regular. No significant upper GI symptoms. Having issues with LE edema, but improving. Following with nephrology. No abdominal pain.  She was scheduled for colonoscopy for continued work-up of IDA. Also planned to update LFTs.   Patient called 4/24 reporting nephrology put her on  spironolactone 25 mg table, instructed to take 1/2 tablet daily.   Colonoscopy 10/07/21: Nonbleeding grade 2 internal hemorrhoids, 2-3-4 mm polyps removed, otherwise normal exam.  Pathology with tubular adenomas.  Recommended repeat in 7 years if overall health permits.  HFP not completed.   Recent labs 11/19/21 prior to knee replacement with AST and ALT normal. Slight elevation of alk phos at 97 though this could be considered normal depending on lab range. Hemoglobin 11.7.  Today:   Anemia:   Elevated LFTs/alk phos/fatty liver:      Was she taking iron prior to hospitalization Consider elastography   Has grade 1 diastolic dysfunction, CKD, diabetes, could contribute to mild alk phos elevation. Consider rechecking HFP and add on GGT.   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 advised 5 year follow up colonoscopy.    COLONOSCOPY N/A 06/12/2018   [place]: three 4-51m polyps at heptic flexure, one 763mpolyp at  hepatic flexure all tubular adenomas, non bleeding external and internal hemorrhoids   COLONOSCOPY WITH PROPOFOL N/A 10/07/2021   Procedure: COLONOSCOPY WITH PROPOFOL;  Surgeon: [name], MD;  [facility];  Service: Endoscopy;  Laterality: N/A;  8:15am   ESOPHAGOGASTRODUODENOSCOPY (EGD) WITH PROPOFOL N/A 06/18/2021   Procedure:  ESOPHAGOGASTRODUODENOSCOPY (EGD) WITH PROPOFOL;  Surgeon: Harvel Quale, MD;  [facility];  Service: Gastroenterology;  Laterality: N/A;   KNEE ARTHROSCOPY Right    PARTIAL HYSTERECTOMY     POLYPECTOMY  06/12/2018   Procedure: POLYPECTOMY;  Surgeon: [name], MD;  [facility];  Service: Endoscopy;;  hep.flex   POLYPECTOMY  06/18/2021   Procedure: POLYPECTOMY;  Surgeon: Harvel Quale, MD;  [facility];  Service: Gastroenterology;;   POLYPECTOMY  10/07/2021   Procedure: POLYPECTOMY;  Surgeon: [name], MD;  [facility];  Service: Endoscopy;;   THYROIDECTOMY, PARTIAL     TONSILLECTOMY      Current Outpatient Medications  Medication Sig Dispense Refill   allopurinol (ZYLOPRIM) 100 MG tablet Take 100 mg by mouth at bedtime.     apixaban (ELIQUIS) 5 MG TABS tablet Take 1 tablet (5 mg total) by mouth 2 (two) times daily. 60 tablet 1   Artificial Tear Solution (SOOTHE XP) SOLN Apply 1-2 drops to eye as needed (dry eyes).      Ascorbic Acid (VITAMIN C PO) Take 1 tablet by mouth daily.     carvedilol (COREG) 6.25 MG tablet Take 1 tablet (6.25 mg total) by mouth 2 (two) times daily with a meal. 60 tablet 1   dapagliflozin propanediol (FARXIGA) 5 MG TABS tablet Take 5 mg by mouth daily.     furosemide (LASIX) 20 MG tablet Take 20 mg by mouth daily.     levocetirizine (XYZAL) 5 MG tablet Take 2.5 mg by mouth every evening.     linagliptin (TRADJENTA) 5 MG TABS tablet Take 1 tablet (5 mg total) by mouth daily. 30 tablet 1   losartan (COZAAR) 25 MG tablet Take 12.5 mg by mouth daily.     MAGNESIUM PO Take 1 tablet by mouth daily.     omeprazole (PRILOSEC) 40 MG capsule Take 40 mg by mouth daily.     polyethylene glycol-electrolytes (NULYTELY) 420 g solution As directed 4000 mL 0   rosuvastatin (CRESTOR) 10 MG tablet Take 1 tablet (10 mg total) by mouth every evening. 30 tablet 1   spironolactone (ALDACTONE) 25 MG tablet  Take 12.5 mg by mouth daily.     Spirulina 500 MG TABS Take 2,000 mg by mouth daily.     SUMAtriptan (IMITREX) 100 MG tablet Take 100 mg by mouth every 2 (two) hours as needed for migraine.     No current facility-administered medications for this visit.    Allergies as of 01/05/2022 - Review Complete 10/07/2021  Allergen Reaction Noted   Latex Rash 06/15/2011   Clonidine derivatives  10/31/2019   Etodolac  04/12/2018   Hydralazine  03/17/2020   Hydrocodone Other (See Comments) 10/13/2017   Naproxen Other (See Comments) 11/10/2011   Statins Cough 10/13/2017   Tramadol  10/20/2011   Amlodipine Cough 01/25/2018   Lisinopril Cough 01/25/2018   Losartan Cough 01/25/2018    Family History  Problem Relation Age of Onset   Allergic rhinitis Sister    Allergic rhinitis Brother    Hypertension Mother    Heart attack Father    Cirrhosis Father  Emphysema Father    Breast cancer Maternal Aunt    Colon cancer Neg Hx     Social History   Socioeconomic History   Marital status: Widowed    Spouse name: Not on file   Number of children: Not on file   Years of education: Not on file   Highest education level: Not on file  Occupational History   Not on file  Tobacco Use   Smoking status: Never   Smokeless tobacco: Never  Vaping Use   Vaping Use: Never used  Substance and Sexual Activity   Alcohol use: Never   Drug use: Never   Sexual activity: Not Currently  Other Topics Concern   Not on file  Social History Narrative   Not on file   Social Determinants of Health   Financial Resource Strain: Not on file  Food Insecurity: Not on file  Transportation Needs: Not on file  Physical Activity: Not on file  Stress: Not on file  Social Connections: Not on file    Review of Systems: Gen: Denies fever, chills, anorexia. Denies fatigue, weakness, weight loss.  CV: Denies chest pain, palpitations, syncope, peripheral edema, and claudication. Resp: Denies dyspnea at rest,  cough, wheezing, coughing up blood, and pleurisy. GI: Denies vomiting blood, jaundice, and fecal incontinence.   Denies dysphagia or odynophagia. Derm: Denies rash, itching, dry skin Psych: Denies depression, anxiety, memory loss, confusion. No homicidal or suicidal ideation.  Heme: Denies bruising, bleeding, and enlarged lymph nodes.  Physical Exam: There were no vitals taken for this visit. General:   Alert and oriented. No distress noted. Pleasant and cooperative.  Head:  Normocephalic and atraumatic. Eyes:  Conjuctiva clear without scleral icterus. Heart:  S1, S2 present without murmurs appreciated. Lungs:  Clear to auscultation bilaterally. No wheezes, rales, or rhonchi. No distress.  Abdomen:  +BS, soft, non-tender and non-distended. No rebound or guarding. No HSM or masses noted. Msk:  Symmetrical without gross deformities. Normal posture. Extremities:  Without edema. Neurologic:  Alert and  oriented x4 Psych:  Normal mood and affect.    Assessment:     Plan:  ***   Aliene Altes, PA-C [facility] 01/05/2022

## 2022-01-05 ENCOUNTER — Encounter: Payer: Self-pay | Admitting: Gastroenterology

## 2022-01-05 ENCOUNTER — Ambulatory Visit: Payer: PPO | Admitting: Gastroenterology

## 2022-01-05 VITALS — BP 114/66 | HR 106 | Temp 97.6°F | Ht 66.0 in | Wt 213.4 lb

## 2022-01-05 DIAGNOSIS — R7989 Other specified abnormal findings of blood chemistry: Secondary | ICD-10-CM | POA: Diagnosis not present

## 2022-01-05 DIAGNOSIS — K760 Fatty (change of) liver, not elsewhere classified: Secondary | ICD-10-CM | POA: Insufficient documentation

## 2022-01-05 DIAGNOSIS — I129 Hypertensive chronic kidney disease with stage 1 through stage 4 chronic kidney disease, or unspecified chronic kidney disease: Secondary | ICD-10-CM | POA: Diagnosis not present

## 2022-01-05 DIAGNOSIS — E1122 Type 2 diabetes mellitus with diabetic chronic kidney disease: Secondary | ICD-10-CM | POA: Diagnosis not present

## 2022-01-05 DIAGNOSIS — N189 Chronic kidney disease, unspecified: Secondary | ICD-10-CM | POA: Diagnosis not present

## 2022-01-05 DIAGNOSIS — D509 Iron deficiency anemia, unspecified: Secondary | ICD-10-CM | POA: Diagnosis not present

## 2022-01-05 DIAGNOSIS — D649 Anemia, unspecified: Secondary | ICD-10-CM | POA: Diagnosis not present

## 2022-01-05 DIAGNOSIS — K5900 Constipation, unspecified: Secondary | ICD-10-CM

## 2022-01-05 DIAGNOSIS — R809 Proteinuria, unspecified: Secondary | ICD-10-CM | POA: Diagnosis not present

## 2022-01-05 DIAGNOSIS — E1129 Type 2 diabetes mellitus with other diabetic kidney complication: Secondary | ICD-10-CM | POA: Diagnosis not present

## 2022-01-05 DIAGNOSIS — I5032 Chronic diastolic (congestive) heart failure: Secondary | ICD-10-CM | POA: Diagnosis not present

## 2022-01-05 NOTE — Patient Instructions (Addendum)
We will arrange you to have an imaging study of your liver to evaluate for fibrosis or cirrhosis.  Continue with your weight loss efforts and keeping good control of your diabetes.  General nutrition recommendations: Recommend 1-2# weight loss per week until ideal body weight through exercise & diet. Low fat/cholesterol diet.   Avoid sweets, sodas, fruit juices, sweetened beverages like tea, etc. Gradually increase exercise from 15 min daily up to 1 hr per day 5 days/week. Continue to avoid alcohol use.  For constipation: Start Linzess 72 mcg daily 30 minutes before breakfast.  We are providing you with samples today.  Please call the progress report in 1-2 weeks.  If this works well for you, I will send in a prescription.  If not, we will have other recommendations.  For your anemia: We will continue to monitor this without further evaluation for now as you requested. I will request your blood work from Dr. Theador Hawthorne for review.  It was nice meeting you today!  We will plan to follow-up in about 3 months.  Aliene Altes, PA-C [facility]

## 2022-01-06 ENCOUNTER — Telehealth: Payer: Self-pay | Admitting: Gastroenterology

## 2022-01-06 NOTE — Telephone Encounter (Signed)
Reviewed labs from Dr. Theador Hawthorne dated 01/05/2022.  Hemoglobin improved to 10.8 from 10.1 in June.  Platelets remain within normal limits at 219.  LFTs were not checked.  As we discussed at her office visit, recommend we update an HFP as well as GGT due to mildly elevated alkaline phosphatase.  Currently, please let her know the above and arrange labs. Dx: Elevated alkaline phosphatase

## 2022-01-07 ENCOUNTER — Other Ambulatory Visit: Payer: Self-pay | Admitting: *Deleted

## 2022-01-07 DIAGNOSIS — I10 Essential (primary) hypertension: Secondary | ICD-10-CM | POA: Diagnosis not present

## 2022-01-07 DIAGNOSIS — Z299 Encounter for prophylactic measures, unspecified: Secondary | ICD-10-CM | POA: Diagnosis not present

## 2022-01-07 DIAGNOSIS — E1165 Type 2 diabetes mellitus with hyperglycemia: Secondary | ICD-10-CM | POA: Diagnosis not present

## 2022-01-07 DIAGNOSIS — Z789 Other specified health status: Secondary | ICD-10-CM | POA: Diagnosis not present

## 2022-01-07 DIAGNOSIS — R7989 Other specified abnormal findings of blood chemistry: Secondary | ICD-10-CM

## 2022-01-07 DIAGNOSIS — R748 Abnormal levels of other serum enzymes: Secondary | ICD-10-CM

## 2022-01-07 DIAGNOSIS — N184 Chronic kidney disease, stage 4 (severe): Secondary | ICD-10-CM | POA: Diagnosis not present

## 2022-01-07 NOTE — Telephone Encounter (Signed)
Noted  

## 2022-01-07 NOTE — Telephone Encounter (Signed)
Spoke to pt, informed of results and recommendations. Pt voiced understanding. Labs entered into Epic.

## 2022-01-12 DIAGNOSIS — Z471 Aftercare following joint replacement surgery: Secondary | ICD-10-CM | POA: Diagnosis not present

## 2022-01-12 DIAGNOSIS — D509 Iron deficiency anemia, unspecified: Secondary | ICD-10-CM | POA: Diagnosis not present

## 2022-01-12 DIAGNOSIS — Z96652 Presence of left artificial knee joint: Secondary | ICD-10-CM | POA: Diagnosis not present

## 2022-01-12 DIAGNOSIS — G8929 Other chronic pain: Secondary | ICD-10-CM | POA: Diagnosis not present

## 2022-01-12 DIAGNOSIS — E039 Hypothyroidism, unspecified: Secondary | ICD-10-CM | POA: Diagnosis not present

## 2022-01-12 DIAGNOSIS — I129 Hypertensive chronic kidney disease with stage 1 through stage 4 chronic kidney disease, or unspecified chronic kidney disease: Secondary | ICD-10-CM | POA: Diagnosis not present

## 2022-01-12 DIAGNOSIS — M5441 Lumbago with sciatica, right side: Secondary | ICD-10-CM | POA: Diagnosis not present

## 2022-01-12 DIAGNOSIS — M5442 Lumbago with sciatica, left side: Secondary | ICD-10-CM | POA: Diagnosis not present

## 2022-01-12 DIAGNOSIS — R748 Abnormal levels of other serum enzymes: Secondary | ICD-10-CM | POA: Diagnosis not present

## 2022-01-12 DIAGNOSIS — E1122 Type 2 diabetes mellitus with diabetic chronic kidney disease: Secondary | ICD-10-CM | POA: Diagnosis not present

## 2022-01-12 DIAGNOSIS — M5416 Radiculopathy, lumbar region: Secondary | ICD-10-CM | POA: Diagnosis not present

## 2022-01-12 DIAGNOSIS — M25552 Pain in left hip: Secondary | ICD-10-CM | POA: Diagnosis not present

## 2022-01-12 DIAGNOSIS — M48062 Spinal stenosis, lumbar region with neurogenic claudication: Secondary | ICD-10-CM | POA: Diagnosis not present

## 2022-01-12 DIAGNOSIS — N183 Chronic kidney disease, stage 3 unspecified: Secondary | ICD-10-CM | POA: Diagnosis not present

## 2022-01-12 DIAGNOSIS — Z9181 History of falling: Secondary | ICD-10-CM | POA: Diagnosis not present

## 2022-01-12 DIAGNOSIS — G43909 Migraine, unspecified, not intractable, without status migrainosus: Secondary | ICD-10-CM | POA: Diagnosis not present

## 2022-01-12 DIAGNOSIS — Z7984 Long term (current) use of oral hypoglycemic drugs: Secondary | ICD-10-CM | POA: Diagnosis not present

## 2022-01-12 DIAGNOSIS — D631 Anemia in chronic kidney disease: Secondary | ICD-10-CM | POA: Diagnosis not present

## 2022-01-12 DIAGNOSIS — K219 Gastro-esophageal reflux disease without esophagitis: Secondary | ICD-10-CM | POA: Diagnosis not present

## 2022-01-12 DIAGNOSIS — F32A Depression, unspecified: Secondary | ICD-10-CM | POA: Diagnosis not present

## 2022-01-12 DIAGNOSIS — Z96651 Presence of right artificial knee joint: Secondary | ICD-10-CM | POA: Diagnosis not present

## 2022-01-12 DIAGNOSIS — E785 Hyperlipidemia, unspecified: Secondary | ICD-10-CM | POA: Diagnosis not present

## 2022-01-12 DIAGNOSIS — G4733 Obstructive sleep apnea (adult) (pediatric): Secondary | ICD-10-CM | POA: Diagnosis not present

## 2022-01-12 DIAGNOSIS — Z7901 Long term (current) use of anticoagulants: Secondary | ICD-10-CM | POA: Diagnosis not present

## 2022-01-12 DIAGNOSIS — Z86711 Personal history of pulmonary embolism: Secondary | ICD-10-CM | POA: Diagnosis not present

## 2022-01-13 ENCOUNTER — Ambulatory Visit (HOSPITAL_COMMUNITY)
Admission: RE | Admit: 2022-01-13 | Discharge: 2022-01-13 | Disposition: A | Discharge status: Home / Self Care | Payer: PPO | Source: Ambulatory Visit | Attending: Gastroenterology | Admitting: Gastroenterology

## 2022-01-13 DIAGNOSIS — N170 Acute kidney failure with tubular necrosis: Secondary | ICD-10-CM | POA: Diagnosis not present

## 2022-01-13 DIAGNOSIS — K7689 Other specified diseases of liver: Secondary | ICD-10-CM | POA: Diagnosis not present

## 2022-01-13 DIAGNOSIS — R809 Proteinuria, unspecified: Secondary | ICD-10-CM | POA: Diagnosis not present

## 2022-01-13 DIAGNOSIS — N189 Chronic kidney disease, unspecified: Secondary | ICD-10-CM | POA: Diagnosis not present

## 2022-01-13 DIAGNOSIS — I5032 Chronic diastolic (congestive) heart failure: Secondary | ICD-10-CM | POA: Diagnosis not present

## 2022-01-13 DIAGNOSIS — I129 Hypertensive chronic kidney disease with stage 1 through stage 4 chronic kidney disease, or unspecified chronic kidney disease: Secondary | ICD-10-CM | POA: Diagnosis not present

## 2022-01-13 DIAGNOSIS — K760 Fatty (change of) liver, not elsewhere classified: Secondary | ICD-10-CM | POA: Insufficient documentation

## 2022-01-13 DIAGNOSIS — E1129 Type 2 diabetes mellitus with other diabetic kidney complication: Secondary | ICD-10-CM | POA: Diagnosis not present

## 2022-01-13 DIAGNOSIS — E1122 Type 2 diabetes mellitus with diabetic chronic kidney disease: Secondary | ICD-10-CM | POA: Diagnosis not present

## 2022-01-13 LAB — HEPATIC FUNCTION PANEL
AG Ratio: 2.2 (calc) (ref 1.0–2.5)
ALT: 13 U/L (ref 6–29)
AST: 17 U/L (ref 10–35)
Albumin: 4.3 g/dL (ref 3.6–5.1)
Alkaline phosphatase (APISO): 112 U/L (ref 37–153)
Bilirubin, Direct: 0.1 mg/dL (ref 0.0–0.2)
Globulin: 2 g/dL (calc) (ref 1.9–3.7)
Indirect Bilirubin: 0.5 mg/dL (calc) (ref 0.2–1.2)
Total Bilirubin: 0.6 mg/dL (ref 0.2–1.2)
Total Protein: 6.3 g/dL (ref 6.1–8.1)

## 2022-01-13 LAB — GAMMA GT: GGT: 39 U/L (ref 3–65)

## 2022-01-21 DIAGNOSIS — Z299 Encounter for prophylactic measures, unspecified: Secondary | ICD-10-CM | POA: Diagnosis not present

## 2022-01-21 DIAGNOSIS — J329 Chronic sinusitis, unspecified: Secondary | ICD-10-CM | POA: Diagnosis not present

## 2022-01-21 DIAGNOSIS — I10 Essential (primary) hypertension: Secondary | ICD-10-CM | POA: Diagnosis not present

## 2022-01-21 DIAGNOSIS — J3489 Other specified disorders of nose and nasal sinuses: Secondary | ICD-10-CM | POA: Diagnosis not present

## 2022-01-25 DIAGNOSIS — Z96652 Presence of left artificial knee joint: Secondary | ICD-10-CM | POA: Diagnosis not present

## 2022-01-25 DIAGNOSIS — Z471 Aftercare following joint replacement surgery: Secondary | ICD-10-CM | POA: Diagnosis not present

## 2022-01-25 DIAGNOSIS — G4733 Obstructive sleep apnea (adult) (pediatric): Secondary | ICD-10-CM | POA: Diagnosis not present

## 2022-01-26 DIAGNOSIS — M1712 Unilateral primary osteoarthritis, left knee: Secondary | ICD-10-CM | POA: Diagnosis not present

## 2022-02-02 DIAGNOSIS — M1712 Unilateral primary osteoarthritis, left knee: Secondary | ICD-10-CM | POA: Diagnosis not present

## 2022-02-04 DIAGNOSIS — M1712 Unilateral primary osteoarthritis, left knee: Secondary | ICD-10-CM | POA: Diagnosis not present

## 2022-02-07 DIAGNOSIS — M1712 Unilateral primary osteoarthritis, left knee: Secondary | ICD-10-CM | POA: Diagnosis not present

## 2022-02-09 DIAGNOSIS — M1712 Unilateral primary osteoarthritis, left knee: Secondary | ICD-10-CM | POA: Diagnosis not present

## 2022-02-10 DIAGNOSIS — N189 Chronic kidney disease, unspecified: Secondary | ICD-10-CM | POA: Diagnosis not present

## 2022-02-10 DIAGNOSIS — R809 Proteinuria, unspecified: Secondary | ICD-10-CM | POA: Diagnosis not present

## 2022-02-10 DIAGNOSIS — E1122 Type 2 diabetes mellitus with diabetic chronic kidney disease: Secondary | ICD-10-CM | POA: Diagnosis not present

## 2022-02-10 DIAGNOSIS — E1129 Type 2 diabetes mellitus with other diabetic kidney complication: Secondary | ICD-10-CM | POA: Diagnosis not present

## 2022-02-10 DIAGNOSIS — I129 Hypertensive chronic kidney disease with stage 1 through stage 4 chronic kidney disease, or unspecified chronic kidney disease: Secondary | ICD-10-CM | POA: Diagnosis not present

## 2022-02-10 DIAGNOSIS — I5032 Chronic diastolic (congestive) heart failure: Secondary | ICD-10-CM | POA: Diagnosis not present

## 2022-02-10 DIAGNOSIS — K219 Gastro-esophageal reflux disease without esophagitis: Secondary | ICD-10-CM | POA: Diagnosis not present

## 2022-02-10 DIAGNOSIS — N184 Chronic kidney disease, stage 4 (severe): Secondary | ICD-10-CM | POA: Diagnosis not present

## 2022-02-10 DIAGNOSIS — N170 Acute kidney failure with tubular necrosis: Secondary | ICD-10-CM | POA: Diagnosis not present

## 2022-02-15 DIAGNOSIS — M1712 Unilateral primary osteoarthritis, left knee: Secondary | ICD-10-CM | POA: Diagnosis not present

## 2022-02-17 DIAGNOSIS — R809 Proteinuria, unspecified: Secondary | ICD-10-CM | POA: Diagnosis not present

## 2022-02-17 DIAGNOSIS — N170 Acute kidney failure with tubular necrosis: Secondary | ICD-10-CM | POA: Diagnosis not present

## 2022-02-17 DIAGNOSIS — N189 Chronic kidney disease, unspecified: Secondary | ICD-10-CM | POA: Diagnosis not present

## 2022-02-17 DIAGNOSIS — I5032 Chronic diastolic (congestive) heart failure: Secondary | ICD-10-CM | POA: Diagnosis not present

## 2022-02-17 DIAGNOSIS — D649 Anemia, unspecified: Secondary | ICD-10-CM | POA: Diagnosis not present

## 2022-02-17 DIAGNOSIS — E1129 Type 2 diabetes mellitus with other diabetic kidney complication: Secondary | ICD-10-CM | POA: Diagnosis not present

## 2022-02-17 DIAGNOSIS — E1122 Type 2 diabetes mellitus with diabetic chronic kidney disease: Secondary | ICD-10-CM | POA: Diagnosis not present

## 2022-02-17 DIAGNOSIS — I129 Hypertensive chronic kidney disease with stage 1 through stage 4 chronic kidney disease, or unspecified chronic kidney disease: Secondary | ICD-10-CM | POA: Diagnosis not present

## 2022-02-18 DIAGNOSIS — M1712 Unilateral primary osteoarthritis, left knee: Secondary | ICD-10-CM | POA: Diagnosis not present

## 2022-02-21 DIAGNOSIS — E1142 Type 2 diabetes mellitus with diabetic polyneuropathy: Secondary | ICD-10-CM | POA: Diagnosis not present

## 2022-02-21 DIAGNOSIS — I10 Essential (primary) hypertension: Secondary | ICD-10-CM | POA: Diagnosis not present

## 2022-02-21 DIAGNOSIS — Z789 Other specified health status: Secondary | ICD-10-CM | POA: Diagnosis not present

## 2022-02-21 DIAGNOSIS — E1122 Type 2 diabetes mellitus with diabetic chronic kidney disease: Secondary | ICD-10-CM | POA: Diagnosis not present

## 2022-02-21 DIAGNOSIS — Z299 Encounter for prophylactic measures, unspecified: Secondary | ICD-10-CM | POA: Diagnosis not present

## 2022-02-21 DIAGNOSIS — G43909 Migraine, unspecified, not intractable, without status migrainosus: Secondary | ICD-10-CM | POA: Diagnosis not present

## 2022-02-21 DIAGNOSIS — M1712 Unilateral primary osteoarthritis, left knee: Secondary | ICD-10-CM | POA: Diagnosis not present

## 2022-02-21 DIAGNOSIS — Z6834 Body mass index (BMI) 34.0-34.9, adult: Secondary | ICD-10-CM | POA: Diagnosis not present

## 2022-02-24 DIAGNOSIS — M1712 Unilateral primary osteoarthritis, left knee: Secondary | ICD-10-CM | POA: Diagnosis not present

## 2022-02-25 DIAGNOSIS — G4733 Obstructive sleep apnea (adult) (pediatric): Secondary | ICD-10-CM | POA: Diagnosis not present

## 2022-03-02 ENCOUNTER — Encounter: Payer: Self-pay | Admitting: *Deleted

## 2022-03-16 DIAGNOSIS — D485 Neoplasm of uncertain behavior of skin: Secondary | ICD-10-CM | POA: Diagnosis not present

## 2022-03-16 DIAGNOSIS — L570 Actinic keratosis: Secondary | ICD-10-CM | POA: Diagnosis not present

## 2022-03-23 DIAGNOSIS — G4733 Obstructive sleep apnea (adult) (pediatric): Secondary | ICD-10-CM | POA: Diagnosis not present

## 2022-03-27 DIAGNOSIS — G4733 Obstructive sleep apnea (adult) (pediatric): Secondary | ICD-10-CM | POA: Diagnosis not present

## 2022-04-12 DIAGNOSIS — Z299 Encounter for prophylactic measures, unspecified: Secondary | ICD-10-CM | POA: Diagnosis not present

## 2022-04-12 DIAGNOSIS — R809 Proteinuria, unspecified: Secondary | ICD-10-CM | POA: Diagnosis not present

## 2022-04-12 DIAGNOSIS — E1129 Type 2 diabetes mellitus with other diabetic kidney complication: Secondary | ICD-10-CM | POA: Diagnosis not present

## 2022-04-12 DIAGNOSIS — G43909 Migraine, unspecified, not intractable, without status migrainosus: Secondary | ICD-10-CM | POA: Diagnosis not present

## 2022-04-12 DIAGNOSIS — I10 Essential (primary) hypertension: Secondary | ICD-10-CM | POA: Diagnosis not present

## 2022-04-12 DIAGNOSIS — E1122 Type 2 diabetes mellitus with diabetic chronic kidney disease: Secondary | ICD-10-CM | POA: Diagnosis not present

## 2022-04-20 DIAGNOSIS — R809 Proteinuria, unspecified: Secondary | ICD-10-CM | POA: Diagnosis not present

## 2022-04-20 DIAGNOSIS — N170 Acute kidney failure with tubular necrosis: Secondary | ICD-10-CM | POA: Diagnosis not present

## 2022-04-20 DIAGNOSIS — E1129 Type 2 diabetes mellitus with other diabetic kidney complication: Secondary | ICD-10-CM | POA: Diagnosis not present

## 2022-04-20 DIAGNOSIS — E1122 Type 2 diabetes mellitus with diabetic chronic kidney disease: Secondary | ICD-10-CM | POA: Diagnosis not present

## 2022-04-20 DIAGNOSIS — N189 Chronic kidney disease, unspecified: Secondary | ICD-10-CM | POA: Diagnosis not present

## 2022-04-20 DIAGNOSIS — D649 Anemia, unspecified: Secondary | ICD-10-CM | POA: Diagnosis not present

## 2022-04-20 DIAGNOSIS — I5032 Chronic diastolic (congestive) heart failure: Secondary | ICD-10-CM | POA: Diagnosis not present

## 2022-04-20 DIAGNOSIS — I129 Hypertensive chronic kidney disease with stage 1 through stage 4 chronic kidney disease, or unspecified chronic kidney disease: Secondary | ICD-10-CM | POA: Diagnosis not present

## 2022-04-21 DIAGNOSIS — N184 Chronic kidney disease, stage 4 (severe): Secondary | ICD-10-CM | POA: Diagnosis not present

## 2022-04-21 DIAGNOSIS — I700 Atherosclerosis of aorta: Secondary | ICD-10-CM | POA: Diagnosis not present

## 2022-04-21 DIAGNOSIS — I4891 Unspecified atrial fibrillation: Secondary | ICD-10-CM | POA: Diagnosis not present

## 2022-04-21 DIAGNOSIS — J329 Chronic sinusitis, unspecified: Secondary | ICD-10-CM | POA: Diagnosis not present

## 2022-04-27 DIAGNOSIS — G4733 Obstructive sleep apnea (adult) (pediatric): Secondary | ICD-10-CM | POA: Diagnosis not present

## 2022-05-07 DIAGNOSIS — Z8679 Personal history of other diseases of the circulatory system: Secondary | ICD-10-CM | POA: Diagnosis not present

## 2022-05-07 DIAGNOSIS — E1122 Type 2 diabetes mellitus with diabetic chronic kidney disease: Secondary | ICD-10-CM | POA: Diagnosis not present

## 2022-05-07 DIAGNOSIS — R600 Localized edema: Secondary | ICD-10-CM | POA: Diagnosis not present

## 2022-05-07 DIAGNOSIS — N189 Chronic kidney disease, unspecified: Secondary | ICD-10-CM | POA: Diagnosis not present

## 2022-05-07 DIAGNOSIS — E1129 Type 2 diabetes mellitus with other diabetic kidney complication: Secondary | ICD-10-CM | POA: Diagnosis not present

## 2022-05-07 DIAGNOSIS — I9589 Other hypotension: Secondary | ICD-10-CM | POA: Diagnosis not present

## 2022-05-07 DIAGNOSIS — D649 Anemia, unspecified: Secondary | ICD-10-CM | POA: Diagnosis not present

## 2022-05-07 DIAGNOSIS — R809 Proteinuria, unspecified: Secondary | ICD-10-CM | POA: Diagnosis not present

## 2022-05-07 DIAGNOSIS — I5032 Chronic diastolic (congestive) heart failure: Secondary | ICD-10-CM | POA: Diagnosis not present

## 2022-05-13 DIAGNOSIS — N184 Chronic kidney disease, stage 4 (severe): Secondary | ICD-10-CM | POA: Diagnosis not present

## 2022-05-13 DIAGNOSIS — I4891 Unspecified atrial fibrillation: Secondary | ICD-10-CM | POA: Diagnosis not present

## 2022-05-13 DIAGNOSIS — J329 Chronic sinusitis, unspecified: Secondary | ICD-10-CM | POA: Diagnosis not present

## 2022-05-13 DIAGNOSIS — Z299 Encounter for prophylactic measures, unspecified: Secondary | ICD-10-CM | POA: Diagnosis not present

## 2022-05-13 IMAGING — DX DG CHEST 1V PORT
1 series · 1 of 1 positions shown · non-contrast
Comparison: 10/31/2019

CLINICAL DATA: Dyspnea

EXAM:
PORTABLE CHEST 1 VIEW

[chest ap]
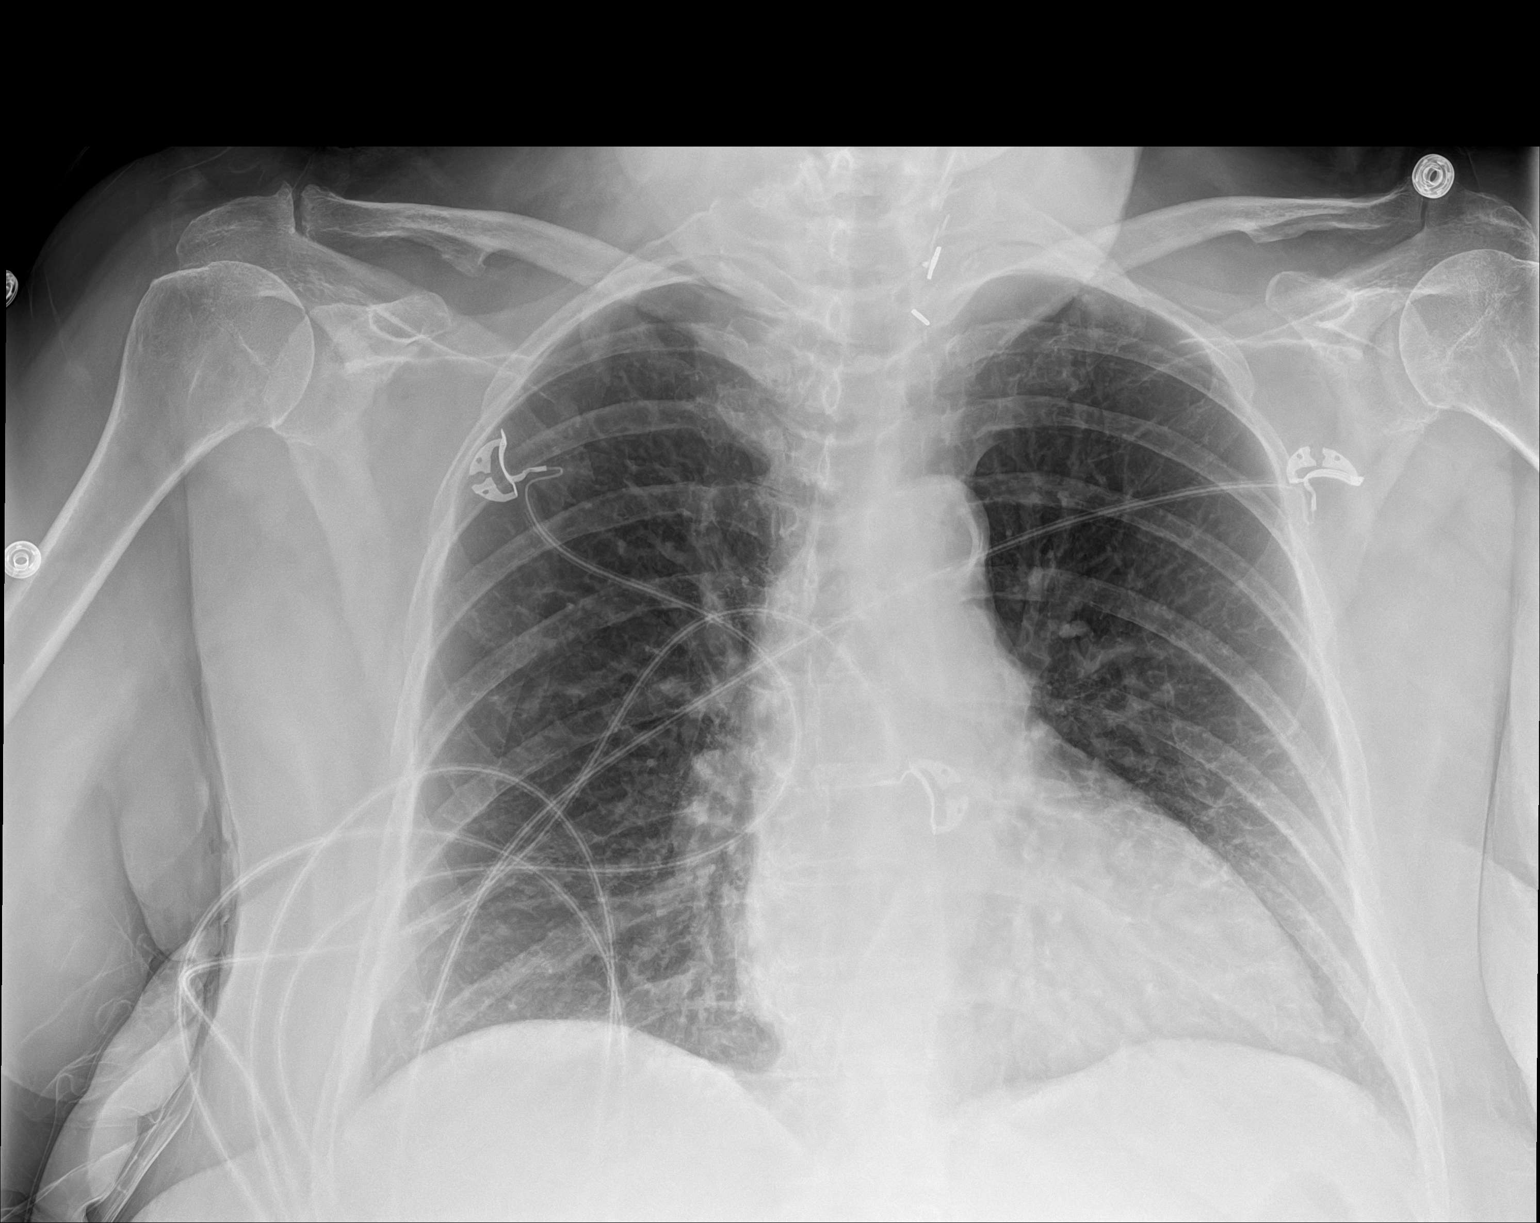

[1 of 1 positions shown; findings below may reference images not displayed]

FINDINGS: Lungs are well expanded, symmetric, and clear. No pneumothorax or
pleural effusion. Cardiac size within normal limits. Pulmonary
vascularity is normal. Osseous structures are age-appropriate. No
acute bone abnormality. Surgical clips noted at the left neck base.
IMPRESSION: No active disease.

## 2022-05-14 IMAGING — US US ABDOMEN COMPLETE
1 series · 14 of 25 positions shown · non-contrast
Comparison: No prior abdominal ultrasound, correlation is made with
06/16/2021 renal ultrasound.

CLINICAL DATA: Abnormal LFTs

EXAM:
ABDOMEN ULTRASOUND COMPLETE

[Series 1: us abdomen complete · 14 of 97 slices shown]
[im 1/97]
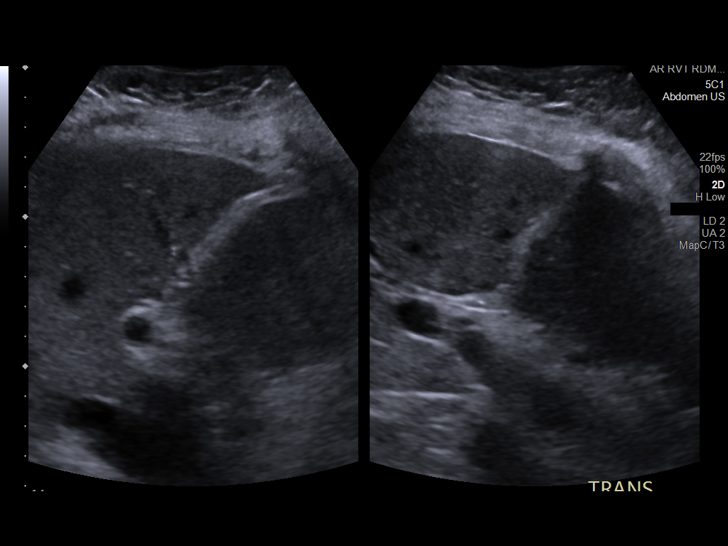
[im 9/97]
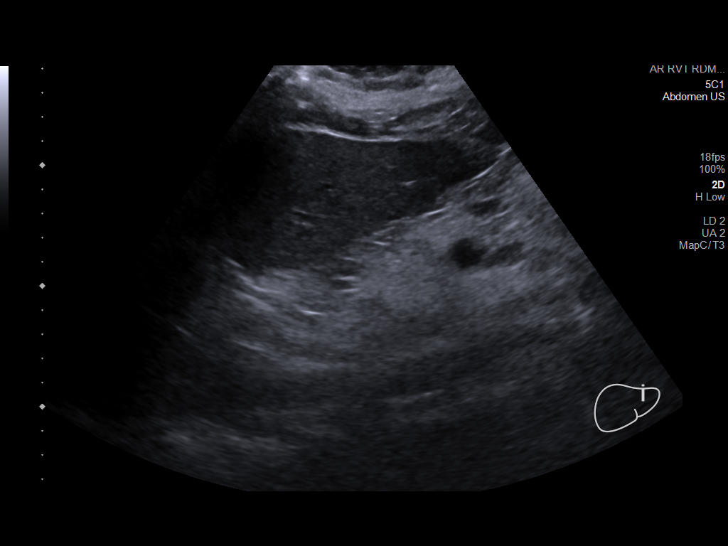
[im 17/97]
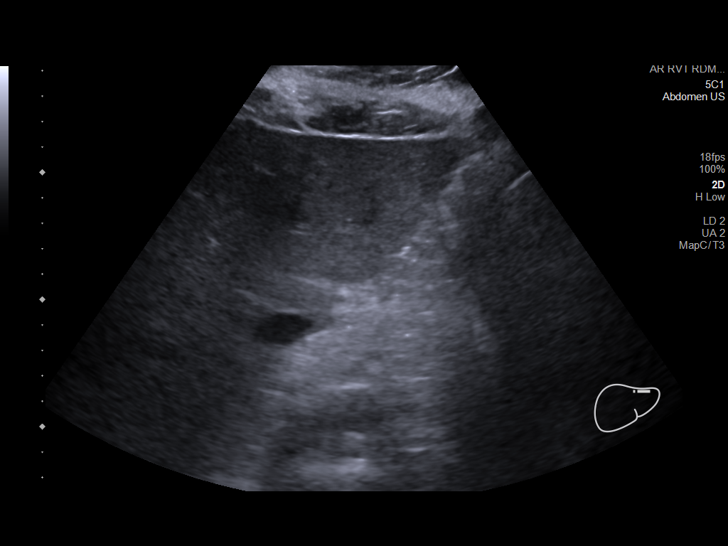
[im 25/97]
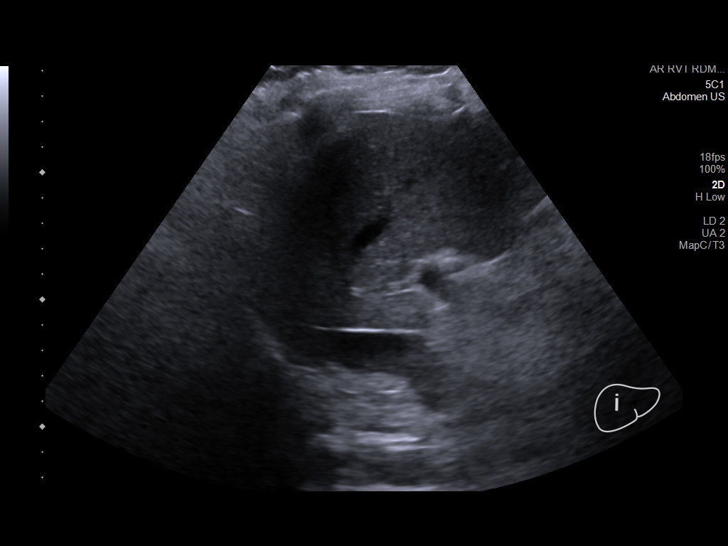
[im 33/97]
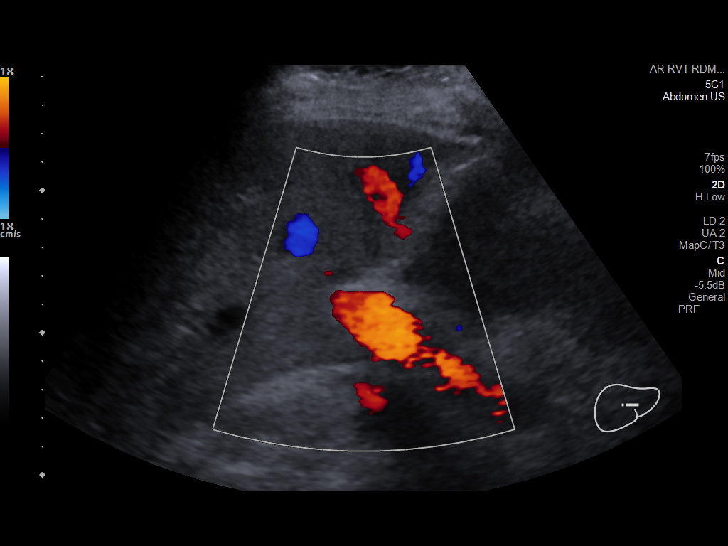
[im 37/97]
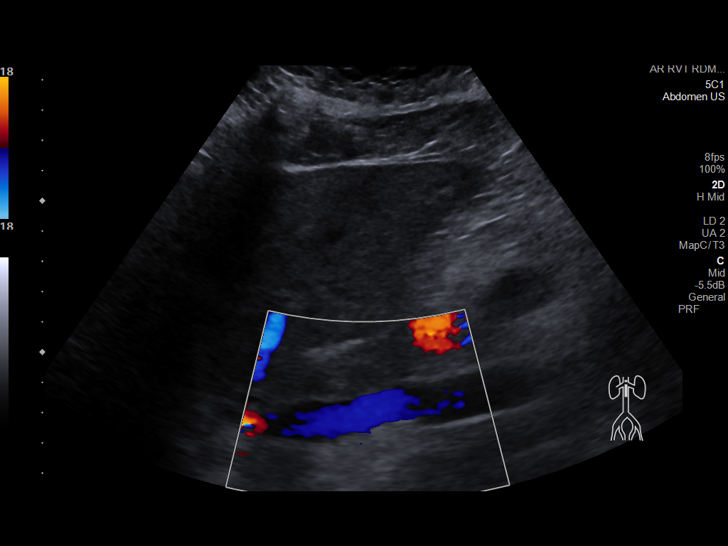
[im 45/97]
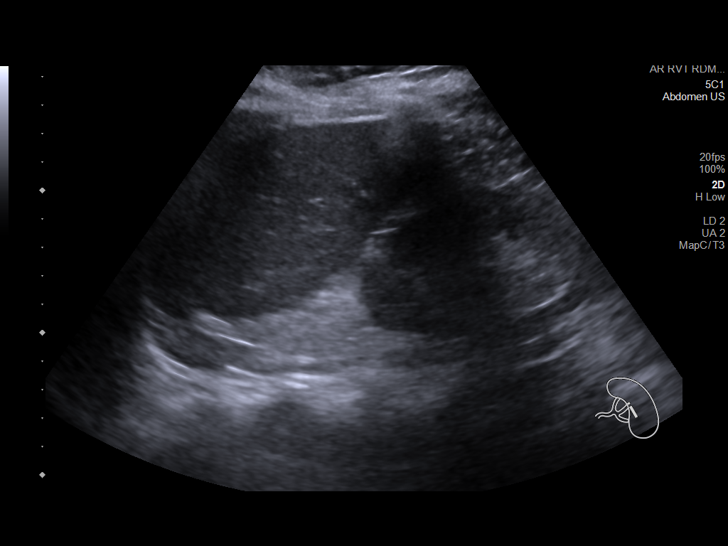
[im 53/97]
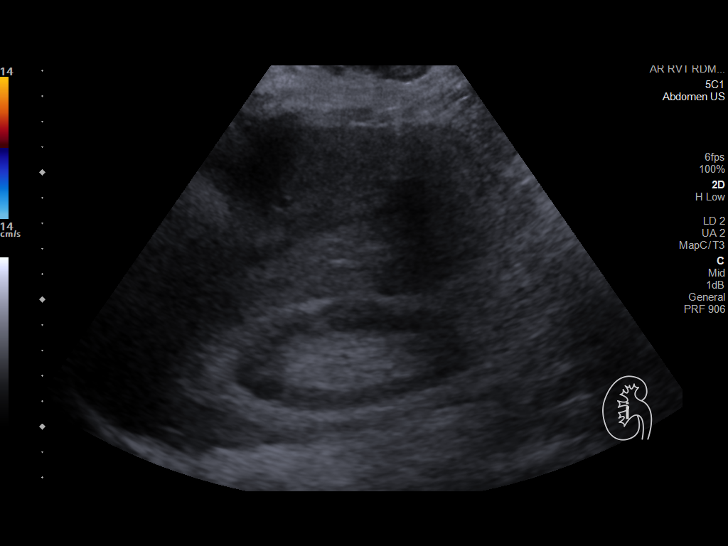
[im 61/97]
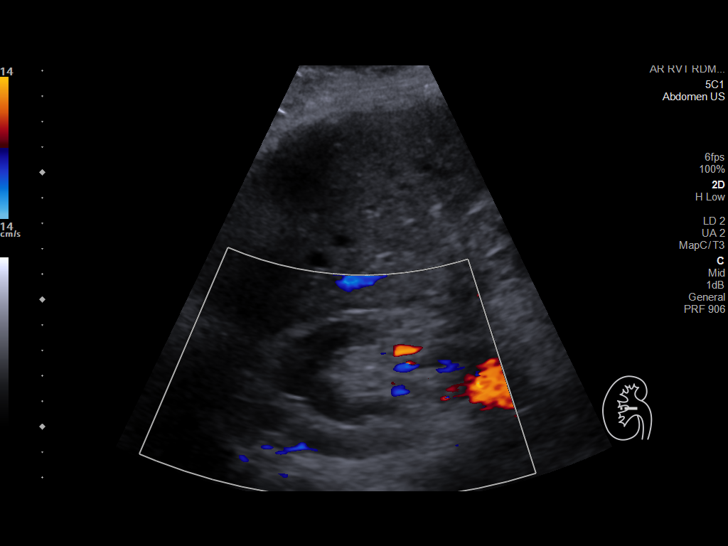
[im 65/97]
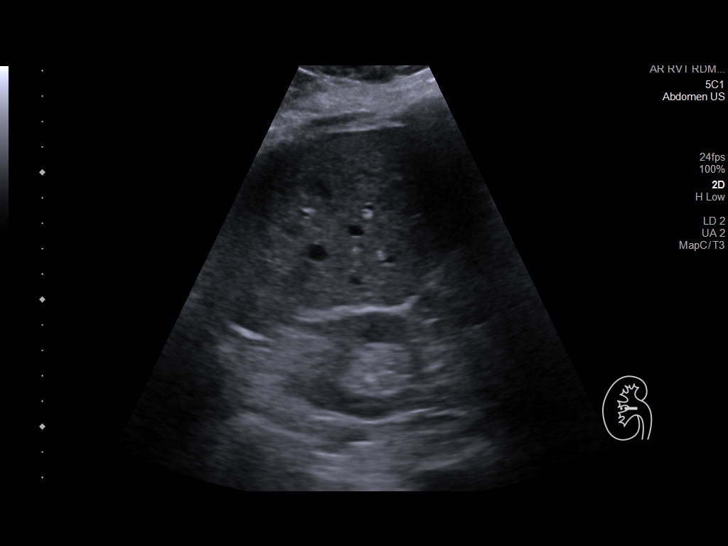
[im 73/97]
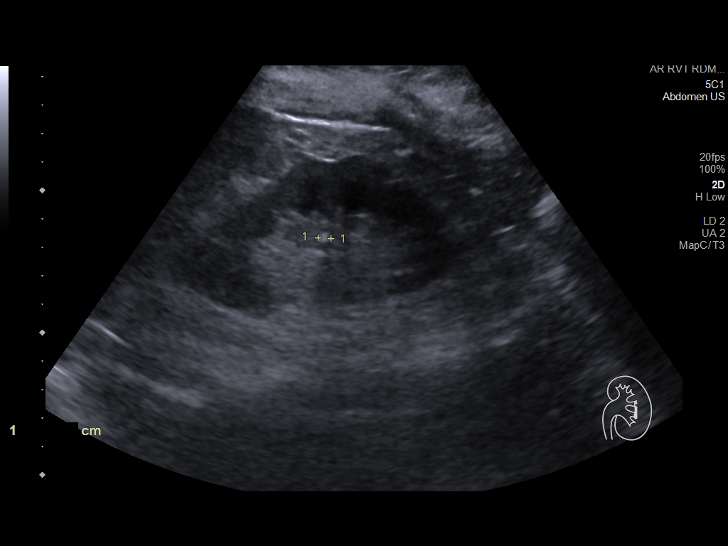
[im 81/97]
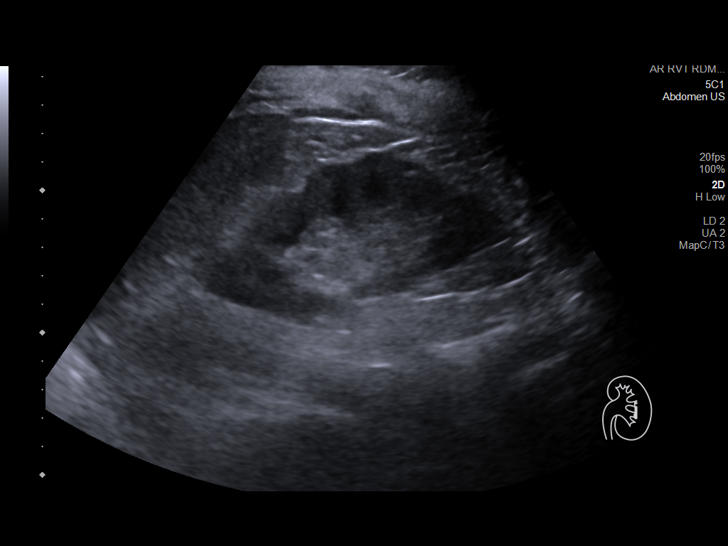
[im 89/97]
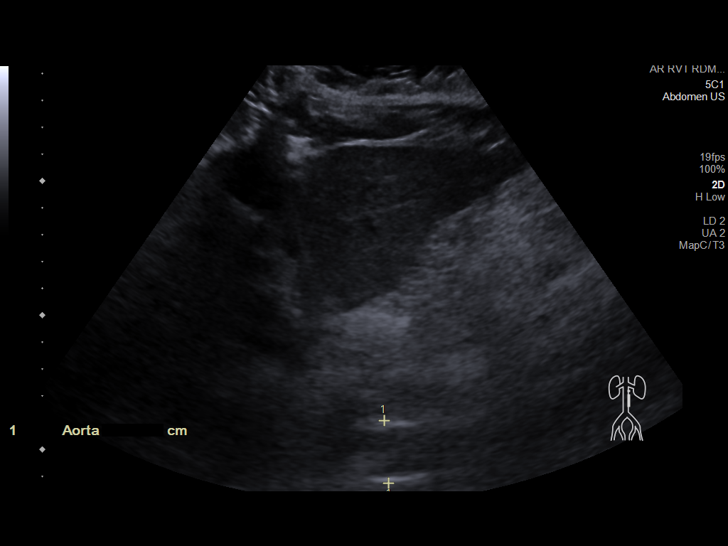
[im 97/97]
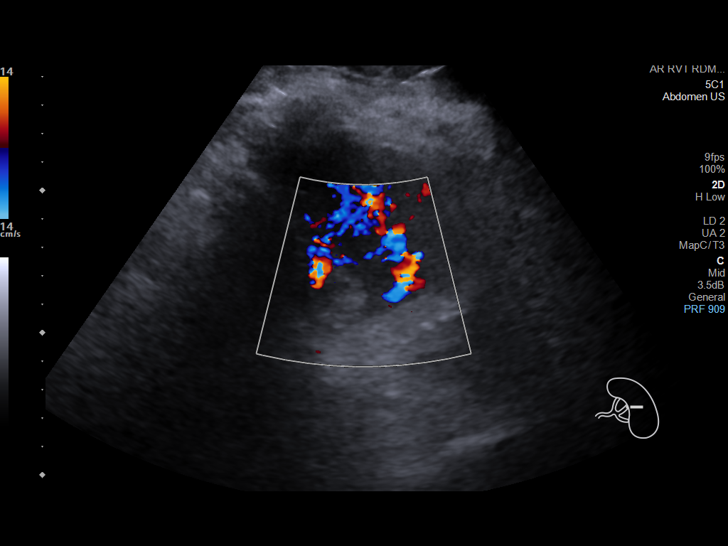

[14 of 25 positions shown; findings below may reference images not displayed]

FINDINGS: Gallbladder: Surgically absent

Common bile duct: Diameter: 6 mm

Liver: No focal lesion identified. Slightly lobular liver contour.
Increased parenchymal echogenicity. Portal vein is patent on color
Doppler imaging with normal direction of blood flow towards the
liver.

IVC: No abnormality visualized.

Pancreas: The tail is obscured by overlying bowel gas. Imaged
portions are unremarkable.

Spleen: Size and appearance within normal limits.

Right Kidney: Length: 10.5 cm. Echogenicity within normal limits. No
mass or hydronephrosis visualized.

Left Kidney: Length: 10.9 cm. Echogenicity within normal limits. No
mass or hydronephrosis visualized.

Abdominal aorta: No aneurysm visualized.

Other findings: None.
IMPRESSION: Slightly lobular liver contour, suggestive of cirrhosis, with
increased parenchymal echogenicity, suggestive of hepatic steatosis.
Otherwise normal ultrasound of the abdomen.

## 2022-05-18 DIAGNOSIS — Z96652 Presence of left artificial knee joint: Secondary | ICD-10-CM | POA: Diagnosis not present

## 2022-05-27 DIAGNOSIS — G4733 Obstructive sleep apnea (adult) (pediatric): Secondary | ICD-10-CM | POA: Diagnosis not present

## 2022-05-30 DIAGNOSIS — R5383 Other fatigue: Secondary | ICD-10-CM | POA: Diagnosis not present

## 2022-05-30 DIAGNOSIS — J321 Chronic frontal sinusitis: Secondary | ICD-10-CM | POA: Diagnosis not present

## 2022-05-30 DIAGNOSIS — Z7189 Other specified counseling: Secondary | ICD-10-CM | POA: Diagnosis not present

## 2022-05-30 DIAGNOSIS — Z1331 Encounter for screening for depression: Secondary | ICD-10-CM | POA: Diagnosis not present

## 2022-05-30 DIAGNOSIS — E7800 Pure hypercholesterolemia, unspecified: Secondary | ICD-10-CM | POA: Diagnosis not present

## 2022-05-30 DIAGNOSIS — I10 Essential (primary) hypertension: Secondary | ICD-10-CM | POA: Diagnosis not present

## 2022-05-30 DIAGNOSIS — Z79899 Other long term (current) drug therapy: Secondary | ICD-10-CM | POA: Diagnosis not present

## 2022-05-30 DIAGNOSIS — Z299 Encounter for prophylactic measures, unspecified: Secondary | ICD-10-CM | POA: Diagnosis not present

## 2022-05-30 DIAGNOSIS — Z1339 Encounter for screening examination for other mental health and behavioral disorders: Secondary | ICD-10-CM | POA: Diagnosis not present

## 2022-05-30 DIAGNOSIS — Z6833 Body mass index (BMI) 33.0-33.9, adult: Secondary | ICD-10-CM | POA: Diagnosis not present

## 2022-05-30 DIAGNOSIS — Z0000 Encounter for general adult medical examination without abnormal findings: Secondary | ICD-10-CM | POA: Diagnosis not present

## 2022-05-30 DIAGNOSIS — D6869 Other thrombophilia: Secondary | ICD-10-CM | POA: Diagnosis not present

## 2022-06-07 DIAGNOSIS — J338 Other polyp of sinus: Secondary | ICD-10-CM | POA: Diagnosis not present

## 2022-06-07 DIAGNOSIS — J321 Chronic frontal sinusitis: Secondary | ICD-10-CM | POA: Diagnosis not present

## 2022-06-07 DIAGNOSIS — J3489 Other specified disorders of nose and nasal sinuses: Secondary | ICD-10-CM | POA: Diagnosis not present

## 2022-06-07 DIAGNOSIS — J329 Chronic sinusitis, unspecified: Secondary | ICD-10-CM | POA: Diagnosis not present

## 2022-06-27 DIAGNOSIS — G4733 Obstructive sleep apnea (adult) (pediatric): Secondary | ICD-10-CM | POA: Diagnosis not present

## 2022-06-30 DIAGNOSIS — I4891 Unspecified atrial fibrillation: Secondary | ICD-10-CM | POA: Diagnosis not present

## 2022-06-30 DIAGNOSIS — J329 Chronic sinusitis, unspecified: Secondary | ICD-10-CM | POA: Diagnosis not present

## 2022-06-30 DIAGNOSIS — N184 Chronic kidney disease, stage 4 (severe): Secondary | ICD-10-CM | POA: Diagnosis not present

## 2022-06-30 DIAGNOSIS — Z299 Encounter for prophylactic measures, unspecified: Secondary | ICD-10-CM | POA: Diagnosis not present

## 2022-06-30 DIAGNOSIS — I700 Atherosclerosis of aorta: Secondary | ICD-10-CM | POA: Diagnosis not present

## 2022-06-30 DIAGNOSIS — E1142 Type 2 diabetes mellitus with diabetic polyneuropathy: Secondary | ICD-10-CM | POA: Diagnosis not present

## 2022-07-18 DIAGNOSIS — E1142 Type 2 diabetes mellitus with diabetic polyneuropathy: Secondary | ICD-10-CM | POA: Diagnosis not present

## 2022-07-18 DIAGNOSIS — Z6832 Body mass index (BMI) 32.0-32.9, adult: Secondary | ICD-10-CM | POA: Diagnosis not present

## 2022-07-18 DIAGNOSIS — I10 Essential (primary) hypertension: Secondary | ICD-10-CM | POA: Diagnosis not present

## 2022-07-18 DIAGNOSIS — E1165 Type 2 diabetes mellitus with hyperglycemia: Secondary | ICD-10-CM | POA: Diagnosis not present

## 2022-07-18 DIAGNOSIS — N184 Chronic kidney disease, stage 4 (severe): Secondary | ICD-10-CM | POA: Diagnosis not present

## 2022-07-18 DIAGNOSIS — Z299 Encounter for prophylactic measures, unspecified: Secondary | ICD-10-CM | POA: Diagnosis not present

## 2022-07-18 DIAGNOSIS — E1122 Type 2 diabetes mellitus with diabetic chronic kidney disease: Secondary | ICD-10-CM | POA: Diagnosis not present

## 2022-07-21 DIAGNOSIS — G4733 Obstructive sleep apnea (adult) (pediatric): Secondary | ICD-10-CM | POA: Diagnosis not present

## 2022-07-28 DIAGNOSIS — G4733 Obstructive sleep apnea (adult) (pediatric): Secondary | ICD-10-CM | POA: Diagnosis not present

## 2022-08-12 DIAGNOSIS — R600 Localized edema: Secondary | ICD-10-CM | POA: Diagnosis not present

## 2022-08-12 DIAGNOSIS — E1129 Type 2 diabetes mellitus with other diabetic kidney complication: Secondary | ICD-10-CM | POA: Diagnosis not present

## 2022-08-12 DIAGNOSIS — R809 Proteinuria, unspecified: Secondary | ICD-10-CM | POA: Diagnosis not present

## 2022-08-12 DIAGNOSIS — D649 Anemia, unspecified: Secondary | ICD-10-CM | POA: Diagnosis not present

## 2022-08-12 DIAGNOSIS — Z8679 Personal history of other diseases of the circulatory system: Secondary | ICD-10-CM | POA: Diagnosis not present

## 2022-08-12 DIAGNOSIS — E1122 Type 2 diabetes mellitus with diabetic chronic kidney disease: Secondary | ICD-10-CM | POA: Diagnosis not present

## 2022-08-12 DIAGNOSIS — I9589 Other hypotension: Secondary | ICD-10-CM | POA: Diagnosis not present

## 2022-08-12 DIAGNOSIS — N189 Chronic kidney disease, unspecified: Secondary | ICD-10-CM | POA: Diagnosis not present

## 2022-08-12 DIAGNOSIS — I5032 Chronic diastolic (congestive) heart failure: Secondary | ICD-10-CM | POA: Diagnosis not present

## 2022-08-16 DIAGNOSIS — Z7901 Long term (current) use of anticoagulants: Secondary | ICD-10-CM | POA: Diagnosis not present

## 2022-08-16 DIAGNOSIS — H905 Unspecified sensorineural hearing loss: Secondary | ICD-10-CM | POA: Diagnosis not present

## 2022-08-16 DIAGNOSIS — I2699 Other pulmonary embolism without acute cor pulmonale: Secondary | ICD-10-CM | POA: Diagnosis not present

## 2022-08-16 DIAGNOSIS — J320 Chronic maxillary sinusitis: Secondary | ICD-10-CM | POA: Diagnosis not present

## 2022-08-16 DIAGNOSIS — J0101 Acute recurrent maxillary sinusitis: Secondary | ICD-10-CM | POA: Diagnosis not present

## 2022-08-16 DIAGNOSIS — G4733 Obstructive sleep apnea (adult) (pediatric): Secondary | ICD-10-CM | POA: Diagnosis not present

## 2022-08-17 DIAGNOSIS — R809 Proteinuria, unspecified: Secondary | ICD-10-CM | POA: Diagnosis not present

## 2022-08-17 DIAGNOSIS — E1129 Type 2 diabetes mellitus with other diabetic kidney complication: Secondary | ICD-10-CM | POA: Diagnosis not present

## 2022-08-17 DIAGNOSIS — E1122 Type 2 diabetes mellitus with diabetic chronic kidney disease: Secondary | ICD-10-CM | POA: Diagnosis not present

## 2022-08-17 DIAGNOSIS — N189 Chronic kidney disease, unspecified: Secondary | ICD-10-CM | POA: Diagnosis not present

## 2022-08-17 DIAGNOSIS — I129 Hypertensive chronic kidney disease with stage 1 through stage 4 chronic kidney disease, or unspecified chronic kidney disease: Secondary | ICD-10-CM | POA: Diagnosis not present

## 2022-08-17 DIAGNOSIS — D649 Anemia, unspecified: Secondary | ICD-10-CM | POA: Diagnosis not present

## 2022-08-17 DIAGNOSIS — I5032 Chronic diastolic (congestive) heart failure: Secondary | ICD-10-CM | POA: Diagnosis not present

## 2022-08-19 ENCOUNTER — Telehealth: Payer: Self-pay | Admitting: Cardiology

## 2022-08-19 NOTE — Telephone Encounter (Signed)
FYI.  °Contacted patient regarding recall appointment, patient notified our office they did not wish to keep this appointment at this time.  Deleted recall from system. °

## 2022-08-26 DIAGNOSIS — G4733 Obstructive sleep apnea (adult) (pediatric): Secondary | ICD-10-CM | POA: Diagnosis not present

## 2022-09-14 DIAGNOSIS — L218 Other seborrheic dermatitis: Secondary | ICD-10-CM | POA: Diagnosis not present

## 2022-09-14 DIAGNOSIS — Z85828 Personal history of other malignant neoplasm of skin: Secondary | ICD-10-CM | POA: Diagnosis not present

## 2022-09-14 DIAGNOSIS — Z85820 Personal history of malignant melanoma of skin: Secondary | ICD-10-CM | POA: Diagnosis not present

## 2022-09-14 DIAGNOSIS — D239 Other benign neoplasm of skin, unspecified: Secondary | ICD-10-CM | POA: Diagnosis not present

## 2022-09-14 DIAGNOSIS — Z1283 Encounter for screening for malignant neoplasm of skin: Secondary | ICD-10-CM | POA: Diagnosis not present

## 2022-09-27 DIAGNOSIS — Z0000 Encounter for general adult medical examination without abnormal findings: Secondary | ICD-10-CM | POA: Diagnosis not present

## 2022-09-27 DIAGNOSIS — Z1331 Encounter for screening for depression: Secondary | ICD-10-CM | POA: Diagnosis not present

## 2022-09-27 DIAGNOSIS — Z7189 Other specified counseling: Secondary | ICD-10-CM | POA: Diagnosis not present

## 2022-09-27 DIAGNOSIS — Z299 Encounter for prophylactic measures, unspecified: Secondary | ICD-10-CM | POA: Diagnosis not present

## 2022-09-27 DIAGNOSIS — I10 Essential (primary) hypertension: Secondary | ICD-10-CM | POA: Diagnosis not present

## 2022-09-27 DIAGNOSIS — I700 Atherosclerosis of aorta: Secondary | ICD-10-CM | POA: Diagnosis not present

## 2022-09-27 DIAGNOSIS — E1122 Type 2 diabetes mellitus with diabetic chronic kidney disease: Secondary | ICD-10-CM | POA: Diagnosis not present

## 2022-09-27 DIAGNOSIS — Z1339 Encounter for screening examination for other mental health and behavioral disorders: Secondary | ICD-10-CM | POA: Diagnosis not present

## 2022-10-18 DIAGNOSIS — R809 Proteinuria, unspecified: Secondary | ICD-10-CM | POA: Diagnosis not present

## 2022-10-18 DIAGNOSIS — E1122 Type 2 diabetes mellitus with diabetic chronic kidney disease: Secondary | ICD-10-CM | POA: Diagnosis not present

## 2022-10-18 DIAGNOSIS — I129 Hypertensive chronic kidney disease with stage 1 through stage 4 chronic kidney disease, or unspecified chronic kidney disease: Secondary | ICD-10-CM | POA: Diagnosis not present

## 2022-10-18 DIAGNOSIS — I5032 Chronic diastolic (congestive) heart failure: Secondary | ICD-10-CM | POA: Diagnosis not present

## 2022-10-18 DIAGNOSIS — E1129 Type 2 diabetes mellitus with other diabetic kidney complication: Secondary | ICD-10-CM | POA: Diagnosis not present

## 2022-10-18 DIAGNOSIS — N189 Chronic kidney disease, unspecified: Secondary | ICD-10-CM | POA: Diagnosis not present

## 2022-10-18 DIAGNOSIS — D649 Anemia, unspecified: Secondary | ICD-10-CM | POA: Diagnosis not present

## 2022-10-19 DIAGNOSIS — G4733 Obstructive sleep apnea (adult) (pediatric): Secondary | ICD-10-CM | POA: Diagnosis not present

## 2022-10-25 DIAGNOSIS — E876 Hypokalemia: Secondary | ICD-10-CM | POA: Diagnosis not present

## 2022-10-25 DIAGNOSIS — I700 Atherosclerosis of aorta: Secondary | ICD-10-CM | POA: Diagnosis not present

## 2022-10-25 DIAGNOSIS — Z299 Encounter for prophylactic measures, unspecified: Secondary | ICD-10-CM | POA: Diagnosis not present

## 2022-10-25 DIAGNOSIS — E1122 Type 2 diabetes mellitus with diabetic chronic kidney disease: Secondary | ICD-10-CM | POA: Diagnosis not present

## 2022-10-25 DIAGNOSIS — I5032 Chronic diastolic (congestive) heart failure: Secondary | ICD-10-CM | POA: Diagnosis not present

## 2022-10-25 DIAGNOSIS — N184 Chronic kidney disease, stage 4 (severe): Secondary | ICD-10-CM | POA: Diagnosis not present

## 2022-10-25 DIAGNOSIS — E1129 Type 2 diabetes mellitus with other diabetic kidney complication: Secondary | ICD-10-CM | POA: Diagnosis not present

## 2022-10-25 DIAGNOSIS — I4891 Unspecified atrial fibrillation: Secondary | ICD-10-CM | POA: Diagnosis not present

## 2022-10-25 DIAGNOSIS — E1165 Type 2 diabetes mellitus with hyperglycemia: Secondary | ICD-10-CM | POA: Diagnosis not present

## 2022-10-26 DIAGNOSIS — H903 Sensorineural hearing loss, bilateral: Secondary | ICD-10-CM | POA: Diagnosis not present

## 2022-10-26 DIAGNOSIS — J320 Chronic maxillary sinusitis: Secondary | ICD-10-CM | POA: Diagnosis not present

## 2022-10-31 DIAGNOSIS — G4733 Obstructive sleep apnea (adult) (pediatric): Secondary | ICD-10-CM | POA: Diagnosis not present

## 2022-10-31 DIAGNOSIS — N184 Chronic kidney disease, stage 4 (severe): Secondary | ICD-10-CM | POA: Diagnosis not present

## 2022-10-31 DIAGNOSIS — I10 Essential (primary) hypertension: Secondary | ICD-10-CM | POA: Diagnosis not present

## 2022-10-31 DIAGNOSIS — I5033 Acute on chronic diastolic (congestive) heart failure: Secondary | ICD-10-CM | POA: Diagnosis not present

## 2022-10-31 DIAGNOSIS — I5032 Chronic diastolic (congestive) heart failure: Secondary | ICD-10-CM | POA: Diagnosis not present

## 2022-10-31 DIAGNOSIS — Z1330 Encounter for screening examination for mental health and behavioral disorders, unspecified: Secondary | ICD-10-CM | POA: Diagnosis not present

## 2022-10-31 DIAGNOSIS — N1832 Chronic kidney disease, stage 3b: Secondary | ICD-10-CM | POA: Diagnosis not present

## 2022-11-22 DIAGNOSIS — G4733 Obstructive sleep apnea (adult) (pediatric): Secondary | ICD-10-CM | POA: Diagnosis not present

## 2022-12-07 DIAGNOSIS — G4733 Obstructive sleep apnea (adult) (pediatric): Secondary | ICD-10-CM | POA: Diagnosis not present

## 2023-01-05 DIAGNOSIS — Z961 Presence of intraocular lens: Secondary | ICD-10-CM | POA: Diagnosis not present

## 2023-01-05 DIAGNOSIS — H524 Presbyopia: Secondary | ICD-10-CM | POA: Diagnosis not present

## 2023-01-05 DIAGNOSIS — E113293 Type 2 diabetes mellitus with mild nonproliferative diabetic retinopathy without macular edema, bilateral: Secondary | ICD-10-CM | POA: Diagnosis not present

## 2023-01-05 DIAGNOSIS — H11153 Pinguecula, bilateral: Secondary | ICD-10-CM | POA: Diagnosis not present

## 2023-01-05 DIAGNOSIS — H43393 Other vitreous opacities, bilateral: Secondary | ICD-10-CM | POA: Diagnosis not present

## 2023-01-07 DIAGNOSIS — M795 Residual foreign body in soft tissue: Secondary | ICD-10-CM | POA: Diagnosis not present

## 2023-01-07 DIAGNOSIS — M19031 Primary osteoarthritis, right wrist: Secondary | ICD-10-CM | POA: Diagnosis not present

## 2023-01-07 DIAGNOSIS — S6981XA Other specified injuries of right wrist, hand and finger(s), initial encounter: Secondary | ICD-10-CM | POA: Diagnosis not present

## 2023-01-07 DIAGNOSIS — I10 Essential (primary) hypertension: Secondary | ICD-10-CM | POA: Diagnosis not present

## 2023-01-07 DIAGNOSIS — X58XXXA Exposure to other specified factors, initial encounter: Secondary | ICD-10-CM | POA: Diagnosis not present

## 2023-01-07 DIAGNOSIS — Z91040 Latex allergy status: Secondary | ICD-10-CM | POA: Diagnosis not present

## 2023-01-07 DIAGNOSIS — Z885 Allergy status to narcotic agent status: Secondary | ICD-10-CM | POA: Diagnosis not present

## 2023-01-07 DIAGNOSIS — M7989 Other specified soft tissue disorders: Secondary | ICD-10-CM | POA: Diagnosis not present

## 2023-01-07 DIAGNOSIS — M19041 Primary osteoarthritis, right hand: Secondary | ICD-10-CM | POA: Diagnosis not present

## 2023-01-07 DIAGNOSIS — E119 Type 2 diabetes mellitus without complications: Secondary | ICD-10-CM | POA: Diagnosis not present

## 2023-01-24 DIAGNOSIS — D631 Anemia in chronic kidney disease: Secondary | ICD-10-CM | POA: Diagnosis not present

## 2023-01-24 DIAGNOSIS — N183 Chronic kidney disease, stage 3 unspecified: Secondary | ICD-10-CM | POA: Diagnosis not present

## 2023-01-24 DIAGNOSIS — R809 Proteinuria, unspecified: Secondary | ICD-10-CM | POA: Diagnosis not present

## 2023-01-31 DIAGNOSIS — E1129 Type 2 diabetes mellitus with other diabetic kidney complication: Secondary | ICD-10-CM | POA: Diagnosis not present

## 2023-01-31 DIAGNOSIS — R809 Proteinuria, unspecified: Secondary | ICD-10-CM | POA: Diagnosis not present

## 2023-01-31 DIAGNOSIS — E1122 Type 2 diabetes mellitus with diabetic chronic kidney disease: Secondary | ICD-10-CM | POA: Diagnosis not present

## 2023-01-31 DIAGNOSIS — E876 Hypokalemia: Secondary | ICD-10-CM | POA: Diagnosis not present

## 2023-02-02 DIAGNOSIS — N184 Chronic kidney disease, stage 4 (severe): Secondary | ICD-10-CM | POA: Diagnosis not present

## 2023-02-02 DIAGNOSIS — I10 Essential (primary) hypertension: Secondary | ICD-10-CM | POA: Diagnosis not present

## 2023-02-02 DIAGNOSIS — D6869 Other thrombophilia: Secondary | ICD-10-CM | POA: Diagnosis not present

## 2023-02-02 DIAGNOSIS — E1165 Type 2 diabetes mellitus with hyperglycemia: Secondary | ICD-10-CM | POA: Diagnosis not present

## 2023-02-02 DIAGNOSIS — Z299 Encounter for prophylactic measures, unspecified: Secondary | ICD-10-CM | POA: Diagnosis not present

## 2023-02-02 DIAGNOSIS — E1122 Type 2 diabetes mellitus with diabetic chronic kidney disease: Secondary | ICD-10-CM | POA: Diagnosis not present

## 2023-03-08 DIAGNOSIS — G4733 Obstructive sleep apnea (adult) (pediatric): Secondary | ICD-10-CM | POA: Diagnosis not present

## 2023-03-11 DIAGNOSIS — R079 Chest pain, unspecified: Secondary | ICD-10-CM | POA: Diagnosis not present

## 2023-03-11 DIAGNOSIS — E119 Type 2 diabetes mellitus without complications: Secondary | ICD-10-CM | POA: Diagnosis not present

## 2023-03-11 DIAGNOSIS — I10 Essential (primary) hypertension: Secondary | ICD-10-CM | POA: Diagnosis not present

## 2023-03-11 DIAGNOSIS — R0789 Other chest pain: Secondary | ICD-10-CM | POA: Diagnosis not present

## 2023-03-11 DIAGNOSIS — N289 Disorder of kidney and ureter, unspecified: Secondary | ICD-10-CM | POA: Diagnosis not present

## 2023-03-11 DIAGNOSIS — Z7901 Long term (current) use of anticoagulants: Secondary | ICD-10-CM | POA: Diagnosis not present

## 2023-03-11 DIAGNOSIS — R0600 Dyspnea, unspecified: Secondary | ICD-10-CM | POA: Diagnosis not present

## 2023-03-11 DIAGNOSIS — I4891 Unspecified atrial fibrillation: Secondary | ICD-10-CM | POA: Diagnosis not present

## 2023-03-11 DIAGNOSIS — Z20822 Contact with and (suspected) exposure to covid-19: Secondary | ICD-10-CM | POA: Diagnosis not present

## 2023-03-11 DIAGNOSIS — I700 Atherosclerosis of aorta: Secondary | ICD-10-CM | POA: Diagnosis not present

## 2023-03-14 DIAGNOSIS — Z85820 Personal history of malignant melanoma of skin: Secondary | ICD-10-CM | POA: Diagnosis not present

## 2023-03-14 DIAGNOSIS — Z85828 Personal history of other malignant neoplasm of skin: Secondary | ICD-10-CM | POA: Diagnosis not present

## 2023-03-14 DIAGNOSIS — L570 Actinic keratosis: Secondary | ICD-10-CM | POA: Diagnosis not present

## 2023-03-14 DIAGNOSIS — L820 Inflamed seborrheic keratosis: Secondary | ICD-10-CM | POA: Diagnosis not present

## 2023-03-14 DIAGNOSIS — D485 Neoplasm of uncertain behavior of skin: Secondary | ICD-10-CM | POA: Diagnosis not present

## 2023-03-16 DIAGNOSIS — E1129 Type 2 diabetes mellitus with other diabetic kidney complication: Secondary | ICD-10-CM | POA: Diagnosis not present

## 2023-03-16 DIAGNOSIS — I5032 Chronic diastolic (congestive) heart failure: Secondary | ICD-10-CM | POA: Diagnosis not present

## 2023-03-16 DIAGNOSIS — R809 Proteinuria, unspecified: Secondary | ICD-10-CM | POA: Diagnosis not present

## 2023-03-16 DIAGNOSIS — E1122 Type 2 diabetes mellitus with diabetic chronic kidney disease: Secondary | ICD-10-CM | POA: Diagnosis not present

## 2023-03-22 ENCOUNTER — Encounter: Payer: Self-pay | Admitting: *Deleted

## 2023-03-22 ENCOUNTER — Ambulatory Visit: Payer: HMO | Attending: Cardiology | Admitting: Cardiology

## 2023-03-22 ENCOUNTER — Encounter: Payer: Self-pay | Admitting: Cardiology

## 2023-03-22 VITALS — BP 138/78 | HR 82 | Ht 67.0 in | Wt 233.0 lb

## 2023-03-22 DIAGNOSIS — R0789 Other chest pain: Secondary | ICD-10-CM | POA: Diagnosis not present

## 2023-03-22 DIAGNOSIS — R0602 Shortness of breath: Secondary | ICD-10-CM

## 2023-03-22 DIAGNOSIS — I10 Essential (primary) hypertension: Secondary | ICD-10-CM | POA: Diagnosis not present

## 2023-03-22 NOTE — Patient Instructions (Signed)
Medication Instructions:   Continue all current medications.   Labwork:  none  Testing/Procedures:  Your physician has requested that you have an echocardiogram. Echocardiography is a painless test that uses sound waves to create images of your heart. It provides your doctor with information about the size and shape of your heart and how well your heart's chambers and valves are working. This procedure takes approximately one hour. There are no restrictions for this procedure. Please do NOT wear cologne, perfume, aftershave, or lotions (deodorant is allowed). Please arrive 15 minutes prior to your appointment time. Office will contact with results via phone, letter or mychart.     Follow-Up:  6 weeks   Any Other Special Instructions Will Be Listed Below (If Applicable).   If you need a refill on your cardiac medications before your next appointment, please call your pharmacy.

## 2023-03-22 NOTE — Progress Notes (Signed)
Clinical Summary Ms. Cavendish is a 77 y.o.female seen today for follow up of the following medical problems.      1. Chest pain - ER visit 10/2019 with chest pain - troponins negative, no acute  EKG changes. CXR no acute process   - reports chest pain that started that morning. Pressure left sided, 7/10 in severity. No other associated symptoms. Worst with deep breathing. Constant pain x 72 hours.  - she thinks it was related to a medication, she stopped taking clonidine and pain stopped.  -restarted clonidine few days later, pain returned.    - chest pain since last visit, less severe.      12/2019 nuclear stress test: no ischemia - 01/2021 echo: LVEF 60-65%, no WMAs, grade I dd, normal RV -no recent chest pains  - 02/2023 ER visit with chest pain and DOE [facility] - trop neg, proBNP 212,  CXR clear.  - symptoms on and off for a month prior. Typically with exertion initially - [place], symptoms were increasing. Some intermittent pain left upper chest - on car ride home constant pain several ours. Not positional - after getting back from beach, went to ER for evalaution - since ER visit constant left sided chest pain, ongoing DOE.     Other medical problems not addressed this visit  2. HTN - allergies clonidine, norvasc, lisinopril, losartan.  - more recently reported side effects to hydralazine, aldactone - issues with gout       - most recently has been on coreg, chlorthalidone.  - renal increased coreg to 12.5mg  bid      3. 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 - no bleeding on xarelto Past Medical History:  Diagnosis Date   Anemia    Arthritis    Bilateral pulmonary embolism (HCC) 10/2017   H/O unprovoked PE in 2017/2018 treated with  anticoagulation for one year and 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 off anticoagulation   Bronchitis    Cancer (HCC)    Skin   Diabetes mellitus without complication (HCC)    Gout    H/O colonoscopy with polypectomy    Hypercholesteremia    Hypertension    Renal disorder    Renal insufficiency      Allergies  Allergen Reactions   Latex Rash    Reaction is only with internal contact    Clonidine Derivatives     Dizzy, dry mouth   Etodolac     Unknown reaction   Hydralazine     Pt unsure of reaction    Hydrocodone Other (See Comments)    Altered mental status-Hallucination, tolerated in low doses    Naproxen Other (See Comments)    Damage to kidney    Statins Cough     tolerates lovastatin    Tramadol     Patient, "Felt funny, didn't feel right, and felt weird."   Amlodipine Cough   Lisinopril Cough   Losartan Cough     Current Outpatient Medications  Medication Sig Dispense Refill   allopurinol (ZYLOPRIM) 100 MG tablet Take 100 mg by mouth at bedtime.     apixaban (ELIQUIS) 5 MG TABS tablet Take 1 tablet (5 mg total) by mouth 2 (two) times daily.  60 tablet 1   Artificial Tear Solution (SOOTHE XP) SOLN Apply 1-2 drops to eye as needed (dry eyes).      Ascorbic Acid (VITAMIN C PO) Take 1 tablet by mouth daily.     carvedilol (COREG) 6.25 MG tablet Take 1 tablet (6.25 mg total) by mouth 2 (two) times daily with a meal. 60 tablet 1   dapagliflozin propanediol (FARXIGA) 5 MG TABS tablet Take 5 mg by mouth daily.     furosemide (LASIX) 20 MG tablet Take 20 mg by mouth daily.     levocetirizine (XYZAL) 5 MG tablet Take 2.5 mg by mouth every evening.     linagliptin (TRADJENTA) 5 MG TABS tablet Take 1 tablet (5 mg total) by mouth daily. 30 tablet 1   losartan (COZAAR) 25 MG tablet Take 12.5 mg by mouth daily.     MAGNESIUM PO Take 1 tablet by mouth daily.     omeprazole (PRILOSEC) 40 MG capsule Take 40 mg by mouth daily.     rosuvastatin (CRESTOR) 10 MG tablet Take  1 tablet (10 mg total) by mouth every evening. 30 tablet 1   spironolactone (ALDACTONE) 25 MG tablet Take 12.5 mg by mouth daily.     Spirulina 500 MG TABS Take 2,000 mg by mouth daily.     SUMAtriptan (IMITREX) 100 MG tablet Take 100 mg by mouth every 2 (two) hours as needed for migraine.     No current facility-administered medications for this visit.     Past Surgical History:  Procedure Laterality Date   bilateral heel spur removed     carpel tunnel Bilateral    CHOLECYSTECTOMY     COLONOSCOPY  2013   [facility]: hyperplastic polyps. advised 5 year follow up colonoscopy.    COLONOSCOPY N/A 06/12/2018   [facility]: three 4-93mm polyps at heptic flexure, one 7mm polyp at hepatic flexure all tubular adenomas, non bleeding external and internal hemorrhoids   COLONOSCOPY WITH PROPOFOL N/A 10/07/2021   Surgeon: Corbin Ade, MD;  Nonbleeding grade 2 internal hemorrhoids, 2-3-4 mm polyps removed, otherwise normal exam.  Pathology with tubular adenomas.  Recommended repeat in 7 years if overall health permits.   ESOPHAGOGASTRODUODENOSCOPY (EGD) WITH PROPOFOL N/A 06/18/2021   Surgeon: Marguerita Merles, Reuel Boom, MD;  2cm hiatal hernia and multiple gastric polyps (3 were removed). Pathology with hyperplastic polyp.   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     Allergies  Allergen Reactions   Latex Rash    Reaction is only with internal contact    Clonidine  Derivatives     Dizzy, dry mouth   Etodolac     Unknown reaction   Hydralazine     Pt unsure of reaction    Hydrocodone Other (See Comments)    Altered mental status-Hallucination, tolerated in low doses    Naproxen Other (See Comments)    Damage to kidney    Statins Cough  tolerates lovastatin    Tramadol     Patient, "Felt funny, didn't feel right, and felt weird."   Amlodipine Cough   Lisinopril Cough   Losartan Cough      Family History  Problem Relation Age of Onset   Hypertension Mother    Heart attack Father    Cirrhosis Father    Emphysema Father    Allergic rhinitis Sister    Allergic rhinitis Brother    Breast cancer Maternal Aunt    Colon cancer Neg Hx      Social History Ms. Hogrefe reports that she has never smoked. She has never used smokeless tobacco. Ms. Placide reports no history of alcohol use.   Review of Systems CONSTITUTIONAL: No weight loss, fever, chills, weakness or fatigue.  HEENT: Eyes: No visual loss, blurred vision, double vision or yellow sclerae.No hearing loss, sneezing, congestion, runny nose or sore throat.  SKIN: No rash or itching.  CARDIOVASCULAR: per hpi RESPIRATORY: per hpi GASTROINTESTINAL: No anorexia, nausea, vomiting or diarrhea. No abdominal pain or blood.  GENITOURINARY: No burning on urination, no polyuria NEUROLOGICAL: No headache, dizziness, syncope, paralysis, ataxia, numbness or tingling in the extremities. No change in bowel or bladder control.  MUSCULOSKELETAL: No muscle, back pain, joint pain or stiffness.  LYMPHATICS: No enlarged nodes. No history of splenectomy.  PSYCHIATRIC: No history of depression or anxiety.  ENDOCRINOLOGIC: No reports of sweating, cold or heat intolerance. No polyuria or polydipsia.  [name]   Physical Examination Today's Vitals   03/22/23 1016  BP: 138/78  Pulse: 82  SpO2: 98%  Weight: 233 lb (105.7 kg)  Height: 5\' 7"  (1.702 m)   Body mass index is 36.49 kg/m.  Gen: resting  comfortably, no acute distress HEENT: no scleral icterus, pupils equal round and reactive, no palptable cervical adenopathy,  CV: RRR, no mrg, no jvd Resp: Clear to auscultation bilaterally GI: abdomen is soft, non-tender, non-distended, normal bowel sounds, no hepatosplenomegaly MSK: extremities are warm,1+ bilateral nonpitting edema  Skin: warm, no rash Neuro:  no focal deficits Psych: appropriate affect   Diagnostic Studies 12/2019 nuclear stress There was no ST segment deviation noted during stress. The study is normal. There are no perfusion defects consistent with prior infarct or current ischemia. This is a low risk study. The left ventricular ejection fraction is normal (55-65%).   01/2021 echo IMPRESSIONS     1. Left ventricular ejection fraction, by estimation, is 60 to 65%. The  left ventricle has normal function. The left ventricle has no regional  wall motion abnormalities. There is moderate left ventricular hypertrophy.  Left ventricular diastolic  parameters are consistent with Grade I diastolic dysfunction (impaired  relaxation).   2. Right ventricular systolic function is normal. The right ventricular  size is normal. There is normal pulmonary artery systolic pressure.   3. The mitral valve is normal in structure. No evidence of mitral valve  regurgitation. No evidence of mitral stenosis.   4. The aortic valve is tricuspid. Aortic valve regurgitation is not  visualized. No aortic stenosis is present.   5. The inferior vena cava is normal in size with greater than 50%  respiratory variability, suggesting right atrial pressure of 3 mmHg.     Assessment and Plan   1.Chest pain/DOE - progressing DOE and chest pains over the the last month and a half - chest pain somewhat atypical but does have an exertional component - will start workup with echo - pending echo results plan for ischemic testing. Given  renal dysfunction would avoid coronary CTA, likely plan for  lexiscan. Cannot run on treadmill due to chronic knee pains and prior bilateral replacements - EKG today SR, no acute ischemic changes.    Antoine Poche, M.D

## 2023-03-24 ENCOUNTER — Encounter: Payer: Self-pay | Admitting: Cardiology

## 2023-03-30 ENCOUNTER — Ambulatory Visit: Payer: HMO | Attending: Cardiology

## 2023-03-30 DIAGNOSIS — R079 Chest pain, unspecified: Secondary | ICD-10-CM

## 2023-03-30 DIAGNOSIS — R0602 Shortness of breath: Secondary | ICD-10-CM

## 2023-03-30 LAB — ECHOCARDIOGRAM COMPLETE
AR max vel: 1.7 cm2
AV Area VTI: 1.66 cm2
AV Area mean vel: 1.72 cm2
AV Mean grad: 7 mm[Hg]
AV Peak grad: 12.4 mm[Hg]
Ao pk vel: 1.76 m/s
Area-P 1/2: 3.17 cm2
Calc EF: 67.7 %
MV VTI: 2.43 cm2
S' Lateral: 3 cm
Single Plane A2C EF: 63.4 %
Single Plane A4C EF: 72.3 %

## 2023-04-04 ENCOUNTER — Other Ambulatory Visit: Payer: HMO

## 2023-04-06 ENCOUNTER — Telehealth: Payer: Self-pay | Admitting: Cardiology

## 2023-04-06 NOTE — Telephone Encounter (Signed)
Follow Up:      Patient said she needs for someone to please call and explain her Echo results. She does not understand the results.

## 2023-04-06 NOTE — Telephone Encounter (Signed)
Patient verbalized understanding that Dr.Branch has not yet viewed her results

## 2023-04-12 NOTE — Telephone Encounter (Signed)
Patient called to follow-up on test results and wants to know next steps regarding her SOB.

## 2023-04-13 ENCOUNTER — Telehealth: Payer: Self-pay | Admitting: Cardiology

## 2023-04-13 ENCOUNTER — Encounter: Payer: Self-pay | Admitting: Cardiology

## 2023-04-13 NOTE — Telephone Encounter (Signed)
Checking percert on the following patient for testing scheduled at [facility].    LEXISCAN 04-18-23

## 2023-04-18 ENCOUNTER — Encounter (HOSPITAL_COMMUNITY)
Admission: RE | Admit: 2023-04-18 | Discharge: 2023-04-18 | Disposition: A | Discharge status: Home / Self Care | Payer: HMO | Source: Ambulatory Visit | Attending: Cardiology | Admitting: Cardiology

## 2023-04-18 ENCOUNTER — Ambulatory Visit (HOSPITAL_COMMUNITY)
Admission: RE | Admit: 2023-04-18 | Discharge: 2023-04-18 | Disposition: A | Discharge status: Home / Self Care | Payer: HMO | Source: Ambulatory Visit | Attending: Cardiology | Admitting: Cardiology

## 2023-04-18 DIAGNOSIS — R079 Chest pain, unspecified: Secondary | ICD-10-CM | POA: Insufficient documentation

## 2023-04-18 LAB — NM MYOCAR MULTI W/SPECT W/WALL MOTION / EF
LV dias vol: 73 mL (ref 46–106)
LV sys vol: 20 mL
Nuc Stress EF: 73 %
Peak HR: 89 {beats}/min
RATE: 0.5
Rest HR: 72 {beats}/min
Rest Nuclear Isotope Dose: 8.4 mCi
SDS: 8
SRS: 1
SSS: 9
ST Depression (mm): 0 mm
Stress Nuclear Isotope Dose: 25.8 mCi
TID: 0.96

## 2023-04-18 MED ORDER — REGADENOSON 0.4 MG/5ML IV SOLN
INTRAVENOUS | Status: AC
  Administered 2023-04-18: 0.4 mg via INTRAVENOUS
  Filled 2023-04-18: qty 5

## 2023-04-18 MED ORDER — SODIUM CHLORIDE FLUSH 0.9 % IV SOLN
INTRAVENOUS | Status: AC
  Administered 2023-04-18: 10 mL via INTRAVENOUS
  Filled 2023-04-18: qty 10

## 2023-04-18 MED ORDER — TECHNETIUM TC 99M TETROFOSMIN IV KIT
30.0000 | PACK | Freq: Once | INTRAVENOUS | Status: AC | PRN
  Administered 2023-04-18: 25.8 via INTRAVENOUS

## 2023-04-18 MED ORDER — TECHNETIUM TC 99M TETROFOSMIN IV KIT
10.0000 | PACK | Freq: Once | INTRAVENOUS | Status: AC | PRN
  Administered 2023-04-18: 8.4 via INTRAVENOUS

## 2023-04-25 ENCOUNTER — Telehealth: Payer: Self-pay | Admitting: Cardiology

## 2023-04-25 NOTE — Telephone Encounter (Signed)
Patient calling in about her results. Please advise

## 2023-04-28 ENCOUNTER — Ambulatory Visit: Payer: HMO | Attending: Cardiology | Admitting: Cardiology

## 2023-04-28 ENCOUNTER — Encounter: Payer: Self-pay | Admitting: Cardiology

## 2023-04-28 VITALS — BP 140/76 | HR 82 | Ht 67.0 in | Wt 224.0 lb

## 2023-04-28 DIAGNOSIS — R0789 Other chest pain: Secondary | ICD-10-CM

## 2023-04-28 DIAGNOSIS — R0602 Shortness of breath: Secondary | ICD-10-CM | POA: Diagnosis not present

## 2023-04-28 MED ORDER — ISOSORBIDE MONONITRATE ER 30 MG PO TB24: 15.0000 mg | ORAL_TABLET | Freq: Every day | ORAL | 3 refills | Status: DC

## 2023-04-28 NOTE — Progress Notes (Signed)
Clinical Summary Lauren Hamilton is a 77 y.o.female seen today for follow up of the following medical problems.    This is a focused visit on symptoms of chest pain and abnormal stress test   1. Chest pain - ER visit 10/2019 with chest pain - troponins negative, no acute  EKG changes. CXR no acute process - 12/2019 nuclear stress test: no ischemia - 01/2021 echo: LVEF 60-65%, no WMAs, grade I dd, normal RV   - 02/2023 ER visit with chest pain and DOE [facility] - trop neg, proBNP 212,  CXR clear.   - symptoms on and off for a month prior. Typically with exertion initially - [place] the [place] before, symptoms were increasing. Some intermittent pain left upper chest - on car ride home constant pain several ours. Not positional - after getting back from beach, went to ER for evalaution - since ER visit constant left sided chest pain, ongoing DOE.       03/2023 echo: LVEF 60-65%, no WMAs, grade I dd 04/2023 nuclear stress: inferior/inferolateral/inferoapical infarct with mild to moderate peri-infarct ischemia.   - since last visit left sided "hurt", 7/10 at its worst. Can have some associated nausea, SOB. Varies in duration, hard to specify duration for her.         Other medical problems not addressed this visit   2. HTN - allergies clonidine, norvasc, lisinopril, losartan.  - more recently reported side effects to hydralazine, aldactone - issues with gout       - most recently has been on coreg, chlorthalidone.  - renal increased coreg to 12.5mg  bid       3. 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 off anticoagulation   Bronchitis    Cancer (HCC)    Skin   Diabetes mellitus without complication (HCC)    Gout    H/O colonoscopy with polypectomy    Hypercholesteremia    Hypertension    Renal disorder    Renal insufficiency      Allergies  Allergen Reactions   Latex Rash    Reaction is only with internal contact    Clonidine Derivatives     Dizzy, dry mouth   Etodolac     Unknown reaction   Hydralazine     Pt unsure of reaction    Hydrocodone Other (See Comments)    Altered mental status-Hallucination, tolerated in low doses    Naproxen Other (See Comments)    Damage to kidney    Statins Cough     tolerates lovastatin    Tramadol     Patient, "Felt funny, didn't feel right, and felt weird."   Amlodipine Cough   Lisinopril Cough   Losartan Cough     Current Outpatient Medications  Medication Sig Dispense Refill   allopurinol (ZYLOPRIM) 100 MG tablet Take 100 mg by mouth at bedtime.     apixaban (ELIQUIS) 5 MG TABS tablet Take 1 tablet (5 mg total) by mouth 2 (  two) times daily. 60 tablet 1   Artificial Tear Solution (SOOTHE XP) SOLN Apply 1-2 drops to eye as needed (dry eyes).      Ascorbic Acid (VITAMIN C PO) Take 1 tablet by mouth daily.     carvedilol (COREG) 6.25 MG tablet Take 1 tablet (6.25 mg total) by mouth 2 (two) times daily with a meal. 60 tablet 1   clobetasol (TEMOVATE) 0.05 % external solution Apply 1 Application topically.     dapagliflozin propanediol (FARXIGA) 5 MG TABS tablet Take 5 mg by mouth daily. Monday, Wednesday and Friday     furosemide (LASIX) 20 MG tablet Take 40 mg by mouth 2 (two) times daily.     ketoconazole (NIZORAL) 2 % shampoo Apply 1 Application topically 2 (two) times a week.     levocetirizine (XYZAL) 5 MG tablet Take 2.5 mg by mouth every evening.     linagliptin (TRADJENTA) 5 MG TABS tablet Take 1 tablet (5 mg total) by mouth daily. 30  tablet 1   losartan (COZAAR) 25 MG tablet Take 12.5 mg by mouth daily. (Patient not taking: Reported on 03/22/2023)     MAGNESIUM PO Take 1 tablet by mouth daily.     omeprazole (PRILOSEC) 40 MG capsule Take 40 mg by mouth daily.     rosuvastatin (CRESTOR) 10 MG tablet Take 1 tablet (10 mg total) by mouth every evening. 30 tablet 1   spironolactone (ALDACTONE) 25 MG tablet Take 5 mg by mouth daily. Tuesday, Thursday, Saturday and Sunday     Spirulina 500 MG TABS Take 2,000 mg by mouth daily. (Patient not taking: Reported on 03/22/2023)     SUMAtriptan (IMITREX) 100 MG tablet Take 100 mg by mouth every 2 (two) hours as needed for migraine.     No current facility-administered medications for this visit.     Past Surgical History:  Procedure Laterality Date   bilateral heel spur removed     carpel tunnel Bilateral    CHOLECYSTECTOMY     COLONOSCOPY  2013   [facility]: hyperplastic polyps. advised 5 year follow up colonoscopy.    COLONOSCOPY N/A 06/12/2018   [facility]: three 4-24mm polyps at heptic flexure, one 7mm polyp at hepatic flexure all tubular adenomas, non bleeding external and internal hemorrhoids   COLONOSCOPY WITH PROPOFOL N/A 10/07/2021   Surgeon: Corbin Ade, MD;  Nonbleeding grade 2 internal hemorrhoids, 2-3-4 mm polyps removed, otherwise normal exam.  Pathology with tubular adenomas.  Recommended repeat in 7 years if overall health permits.   ESOPHAGOGASTRODUODENOSCOPY (EGD) WITH PROPOFOL N/A 06/18/2021   Surgeon: Marguerita Merles, Reuel Boom, MD;  2cm hiatal hernia and multiple gastric polyps (3 were removed). Pathology with hyperplastic polyp.   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     Allergies  Allergen Reactions   Latex Rash    Reaction is only with internal contact    Clonidine Derivatives     Dizzy, dry mouth  Etodolac     Unknown reaction   Hydralazine     Pt unsure of reaction    Hydrocodone Other (See Comments)    Altered mental status-Hallucination, tolerated in low doses    Naproxen Other (See Comments)    Damage to kidney    Statins Cough     tolerates lovastatin    Tramadol     Patient, "Felt funny, didn't feel right, and felt weird."   Amlodipine Cough   Lisinopril Cough   Losartan Cough      Family History  Problem Relation Age of Onset   Hypertension Mother    Heart attack Father    Cirrhosis Father    Emphysema Father    Allergic rhinitis Sister    Allergic rhinitis Brother    Breast cancer Maternal Aunt    Colon cancer Neg Hx      Social History Lauren Hamilton reports that she has never smoked. She has never used smokeless tobacco. Lauren Hamilton reports no history of alcohol use.   Review of Systems CONSTITUTIONAL: No weight loss, fever, chills, weakness or fatigue.  HEENT: Eyes: No visual loss, blurred vision, double vision or yellow sclerae.No hearing loss, sneezing, congestion, runny nose or sore throat.  SKIN: No rash or itching.  CARDIOVASCULAR: per hpi RESPIRATORY: per hpi GASTROINTESTINAL: No anorexia, nausea, vomiting or diarrhea. No abdominal pain or blood.  GENITOURINARY: No burning on urination, no polyuria NEUROLOGICAL: No headache, dizziness, syncope, paralysis, ataxia, numbness or tingling in the extremities. No change in bowel or bladder control.  MUSCULOSKELETAL: No muscle, back pain, joint pain or stiffness.  LYMPHATICS: No enlarged nodes. No history of  splenectomy.  PSYCHIATRIC: No history of depression or anxiety.  ENDOCRINOLOGIC: No reports of sweating, cold or heat intolerance. No polyuria or polydipsia.  [name]   Physical Examination Today's Vitals   04/28/23 1115  BP: (!) 140/76  Pulse: 82  SpO2: 94%  Weight: 224 lb (101.6 kg)  Height: 5\' 7"  (1.702 m)   Body mass index is 35.08 kg/m.; Gen: resting comfortably, no acute distress HEENT: no scleral icterus, pupils equal round and reactive, no palptable cervical adenopathy,  CV: RRR, no m/rg, no jvd Resp: Clear to auscultation bilaterally GI: abdomen is soft, non-tender, non-distended, normal bowel sounds, no hepatosplenomegaly MSK: extremities are warm, no edema.  Skin: warm, no rash Neuro:  no focal deficits Psych: appropriate affect   Diagnostic Studies 12/2019 nuclear stress There was no ST segment deviation noted during stress. The study is normal. There are no perfusion defects consistent with prior infarct or current ischemia. This is a low risk study. The left ventricular ejection fraction is normal (55-65%).   01/2021 echo IMPRESSIONS     1. Left ventricular ejection fraction, by estimation, is 60 to 65%. The  left ventricle has normal function. The left ventricle has no regional  wall motion abnormalities. There is moderate left ventricular hypertrophy.  Left ventricular diastolic  parameters are consistent with Grade I diastolic dysfunction (impaired  relaxation).   2. Right ventricular systolic function is normal. The right ventricular  size is normal. There is normal pulmonary artery systolic pressure.   3. The mitral valve is normal in structure. No evidence of mitral valve  regurgitation. No evidence of mitral stenosis.   4. The aortic valve is tricuspid. Aortic valve regurgitation is not  visualized. No aortic stenosis is present.   5. The inferior vena cava is normal in size with greater than 50%  respiratory variability, suggesting right  atrial  pressure of 3 mmHg.    04/2023 nuclear stress   Findings are consistent with prior inferior/inferolatera/inferoapical infarction with mild to moderate peri-infarct ischemia. Low to intermediate risk study   No ST deviation was noted.   LV perfusion is abnormal. Moderate size mild to moderate intensity inferior/inferolateral/inferoapical defect with mild to moderate reversibility.   Left ventricular function is normal. Nuclear stress EF: 73%. The left ventricular ejection fraction is hyperdynamic (>65%). End diastolic cavity size is normal.  Assessment and Plan   1.Chest pain/DOE - progressing DOE and chest pains over the the last month and a half - chest pain somewhat atypical but does have an exertional component - lexiscan suggests area of peri-infarct ischemia, low to intermediate risk - add imdur 15mg  daily. She has norvasc allegy, can titrate imdur over time. 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 Work with medical therapy to see if symptoms improve.         Antoine Poche, M.D.

## 2023-04-28 NOTE — Patient Instructions (Signed)
Medication Instructions:  Your physician has recommended you make the following change in your medication:   -Start Imdur 15 mg tablet once daily.   *If you need a refill on your cardiac medications before your next appointment, please call your pharmacy*   Lab Work: None If you have labs (blood work) drawn today and your tests are completely normal, you will receive your results only by: MyChart Message (if you have MyChart) OR A paper copy in the mail If you have any lab test that is abnormal or we need to change your treatment, we will call you to review the results.   Testing/Procedures: None   Follow-Up: At [facility], you and your health needs are our priority.  As part of our continuing mission to provide you with exceptional heart care, we have created designated Provider Care Teams.  These Care Teams include your primary Cardiologist (physician) and Advanced Practice Providers (APPs -  Physician Assistants and Nurse Practitioners) who all work together to provide you with the care you need, when you need it.  We recommend signing up for the patient portal called "MyChart".  Sign up information is provided on this After Visit Summary.  MyChart is used to connect with patients for Virtual Visits (Telemedicine).  Patients are able to view lab/test results, encounter notes, upcoming appointments, etc.  Non-urgent messages can be sent to your provider as well.   To learn more about what you can do with MyChart, go to [email or web address].    Your next appointment:   3 month(s)  Provider:   You may see Lauren Rich, MD or one of the following Advanced Practice Providers on your designated Care Team:   Lauren An, PA-C  Lauren Hamilton, [place]     Other Instructions Please update our office on your chest pain in 1 week.

## 2023-05-02 DIAGNOSIS — E1129 Type 2 diabetes mellitus with other diabetic kidney complication: Secondary | ICD-10-CM | POA: Diagnosis not present

## 2023-05-02 DIAGNOSIS — I129 Hypertensive chronic kidney disease with stage 1 through stage 4 chronic kidney disease, or unspecified chronic kidney disease: Secondary | ICD-10-CM | POA: Diagnosis not present

## 2023-05-02 DIAGNOSIS — I5032 Chronic diastolic (congestive) heart failure: Secondary | ICD-10-CM | POA: Diagnosis not present

## 2023-05-02 DIAGNOSIS — N189 Chronic kidney disease, unspecified: Secondary | ICD-10-CM | POA: Diagnosis not present

## 2023-05-02 DIAGNOSIS — E1122 Type 2 diabetes mellitus with diabetic chronic kidney disease: Secondary | ICD-10-CM | POA: Diagnosis not present

## 2023-05-03 DIAGNOSIS — Z79899 Other long term (current) drug therapy: Secondary | ICD-10-CM | POA: Diagnosis not present

## 2023-05-03 DIAGNOSIS — I700 Atherosclerosis of aorta: Secondary | ICD-10-CM | POA: Diagnosis not present

## 2023-05-03 DIAGNOSIS — Z0000 Encounter for general adult medical examination without abnormal findings: Secondary | ICD-10-CM | POA: Diagnosis not present

## 2023-05-03 DIAGNOSIS — R5383 Other fatigue: Secondary | ICD-10-CM | POA: Diagnosis not present

## 2023-05-03 DIAGNOSIS — I10 Essential (primary) hypertension: Secondary | ICD-10-CM | POA: Diagnosis not present

## 2023-05-03 DIAGNOSIS — E1140 Type 2 diabetes mellitus with diabetic neuropathy, unspecified: Secondary | ICD-10-CM | POA: Diagnosis not present

## 2023-05-03 DIAGNOSIS — E7800 Pure hypercholesterolemia, unspecified: Secondary | ICD-10-CM | POA: Diagnosis not present

## 2023-05-03 DIAGNOSIS — Z299 Encounter for prophylactic measures, unspecified: Secondary | ICD-10-CM | POA: Diagnosis not present

## 2023-05-04 ENCOUNTER — Ambulatory Visit: Payer: HMO | Admitting: Nurse Practitioner

## 2023-05-05 ENCOUNTER — Other Ambulatory Visit (HOSPITAL_COMMUNITY): Payer: Self-pay

## 2023-05-05 ENCOUNTER — Telehealth: Payer: Self-pay | Admitting: Cardiology

## 2023-05-05 MED ORDER — ISOSORBIDE MONONITRATE ER 30 MG PO TB24: 30.0000 mg | ORAL_TABLET | Freq: Every day | ORAL | 3 refills | Status: DC

## 2023-05-05 MED ORDER — ISOSORBIDE MONONITRATE ER 30 MG PO TB24
30.0000 mg | ORAL_TABLET | Freq: Every day | ORAL | 3 refills | Status: DC
  Filled 2023-05-05: qty 90, 90d supply, fill #0

## 2023-05-05 NOTE — Telephone Encounter (Signed)
Patient is returning call.  °

## 2023-05-05 NOTE — Telephone Encounter (Signed)
Pt c/o medication issue:  1. Name of Medication:   isosorbide mononitrate (IMDUR) 30 MG 24 hr tablet    2. How are you currently taking this medication (dosage and times per day)? Take 0.5 tablets (15 mg total) by mouth daily.   3. Are you having a reaction (difficulty breathing--STAT)? No  4. What is your medication issue? Pt states that she was told to c/b after taking medication for a week to report how she is doing. Pt says that since taking medication she continues to have CP occasionally but they are not as bad. Please advise

## 2023-05-05 NOTE — Telephone Encounter (Signed)
Please increase imdur to 30mg  daily and update [place] next week on symptoms  Dominga Ferry MD

## 2023-05-05 NOTE — Telephone Encounter (Signed)
While patient has had some relief of CP on Imdur 15 mg, she still has CP at home. She states she has not had any low blood pressures since starting Imdur.    Please advise.

## 2023-05-05 NOTE — Telephone Encounter (Signed)
Returned call to pt. No answer. No voice mail.

## 2023-05-05 NOTE — Telephone Encounter (Signed)
Pt notified to increase Imdur to 30 mg Daily and to update [place] next week on symptoms. Order placed to pharmacy.

## 2023-05-10 DIAGNOSIS — E1165 Type 2 diabetes mellitus with hyperglycemia: Secondary | ICD-10-CM | POA: Diagnosis not present

## 2023-05-10 DIAGNOSIS — Z299 Encounter for prophylactic measures, unspecified: Secondary | ICD-10-CM | POA: Diagnosis not present

## 2023-05-10 DIAGNOSIS — I4891 Unspecified atrial fibrillation: Secondary | ICD-10-CM | POA: Diagnosis not present

## 2023-05-10 DIAGNOSIS — M10072 Idiopathic gout, left ankle and foot: Secondary | ICD-10-CM | POA: Diagnosis not present

## 2023-05-10 DIAGNOSIS — I10 Essential (primary) hypertension: Secondary | ICD-10-CM | POA: Diagnosis not present

## 2023-05-10 DIAGNOSIS — I5032 Chronic diastolic (congestive) heart failure: Secondary | ICD-10-CM | POA: Diagnosis not present

## 2023-05-15 ENCOUNTER — Telehealth: Payer: Self-pay | Admitting: Cardiology

## 2023-05-15 NOTE — Telephone Encounter (Signed)
Patient says that she is still experiencing pain in her legs with the increase to 30 mg Imdur

## 2023-05-15 NOTE — Telephone Encounter (Signed)
Patient states symptoms are manageable and she will remain on Imdur 30 mg and call back in a week with update.

## 2023-05-15 NOTE — Telephone Encounter (Signed)
Patient states she does not have leg pain, she was calling to report how her chest pain was with the increase of Imdur to 30 mg. She says she is "a whole lot better", 90% better. She still has some CP with exertion which resolves eventually when she sits.   I will forward to Dr.Branch for review.

## 2023-05-15 NOTE — Telephone Encounter (Signed)
If symptoms tolerable could hold steady on current dose, if still bothersome could increase imdur to 45mg  daily and update [place] again next week  Dominga Ferry MD

## 2023-05-19 ENCOUNTER — Telehealth: Payer: Self-pay | Admitting: Cardiology

## 2023-05-19 NOTE — Telephone Encounter (Signed)
Patient is calling back from previous concern of medication not working. She feels that her BP was lower from the previous times. Last night 130/70. The night before 119/69, and before that 130/71, 139/70, and 143/77. Stated that she can still feel the chest pain in her chest when she is moving around like when she was vacuuming, and when she isn't doing anything. Stated that she would like medication increased.

## 2023-05-19 NOTE — Telephone Encounter (Signed)
Pt c/o medication issue:  1. Name of Medication:   isosorbide mononitrate (IMDUR) 30 MG 24 hr tablet    2. How are you currently taking this medication (dosage and times per day)?  Take 1 tablet (30 mg total) by mouth daily.       3. Are you having a reaction (difficulty breathing--STAT)? No  4. What is your medication issue? Pt is requesting a callback regarding this medication due to her feeling like this dosage isn't working for her. She stated they've been trying to regulate it but may have to try something else or a different dosage. Please advise.

## 2023-05-23 NOTE — Telephone Encounter (Signed)
Can increase imdur to 60mg  daily  Dominga Ferry MD

## 2023-05-23 NOTE — Progress Notes (Signed)
Lauren Hamilton, female    DOB: 10-05-1945    MRN: [id number]   Brief patient profile:  49  yowf  never smoker passive smoke exp with life long sinus problems dating back to childhood but never asthma   referred to pulmonary clinic in [place]  05/24/2023 by Dr Lauren Hamilton  for doe new onset  since Aug 2024  assoc with reproducible L ant cp which she has described as constant but usually resolve or diminish at rest and never occur supine.   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    History of Present Illness  05/24/2023  Pulmonary/ 1st office eval/ Lauren Hamilton / [place] [facility]  Chief Complaint  Patient presents with   Establish Care   Shortness of Breath  Dyspnea:  comfortable at rest even though cp present at rest sometimes, what she really means I think is that the cp brought on by exertion doesn't immediately resolve at rest, but has improved for sure since starting  imdur  Cough: assoc with pnds   Sleep: bed is flat / one pillow / cpap ([facility] doc)  SABA use: none  02: none   No obvious day to day or daytime pattern/variability or assoc excess/ purulent sputum or mucus plugs or hemoptysis or  chest tightness, subjective wheeze or overt  hb symptoms.    Also denies any obvious fluctuation of symptoms with weather or environmental changes or other aggravating or alleviating factors except as outlined above   No unusual exposure hx or h/o childhood pna/ asthma or knowledge of premature birth.  Current Allergies, Complete Past Medical History, Past Surgical History, Family History, and Social History were reviewed in [employer] record.  ROS  The following are not active complaints unless bolded Hoarseness, sore throat, dysphagia, dental problems, itching, sneezing,  nasal congestion or discharge of excess mucus or purulent secretions, ear ache,   fever, chills, sweats, unintended wt loss or wt gain, classically pleuritic or exertional cp,  orthopnea pnd or  arm/hand swelling  or leg swelling, presyncope, palpitations, abdominal pain, anorexia, nausea, vomiting, diarrhea  or change in bowel habits or change in bladder habits, change in stools or change in urine, dysuria, hematuria,  rash, arthralgias, visual complaints, headache, numbness, weakness or ataxia or problems with walking or coordination,  change in Hamilton or  memory.              Outpatient Medications Prior to Visit  Medication Sig Dispense Refill   allopurinol (ZYLOPRIM) 300 MG tablet Take 300 mg by mouth daily.     apixaban (ELIQUIS) 5 MG TABS tablet Take 1 tablet (5 mg total) by mouth 2 (two) times daily. 60 tablet 1   Artificial Tear Solution (SOOTHE XP) SOLN Apply 1-2 drops to eye as needed (dry eyes).      Ascorbic Acid (VITAMIN C PO) Take 1 tablet by mouth daily.     Capsicum, Cayenne, (CAYENNE PEPPER PO) Take by mouth.     carvedilol (COREG) 6.25 MG tablet Take 1 tablet (6.25 mg total) by mouth 2 (two) times daily with a meal. 60 tablet 1   clobetasol (TEMOVATE) 0.05 % external solution Apply 1 Application topically.     colchicine 0.6 MG tablet Take 0.6 mg by mouth 2 (two) times daily.     dapagliflozin propanediol (FARXIGA) 5 MG TABS tablet Take 5 mg by mouth daily. Monday, Wednesday and Friday     famotidine (PEPCID) 40 MG tablet Take 40 mg by mouth at  bedtime.     furosemide (LASIX) 20 MG tablet Take 40 mg by mouth 2 (two) times daily.     isosorbide mononitrate (IMDUR) 30 MG 24 hr tablet Take 1 tablet (30 mg total) by mouth daily. 90 tablet 3   ketoconazole (NIZORAL) 2 % shampoo Apply 1 Application topically 2 (two) times a week.     levocetirizine (XYZAL) 5 MG tablet Take 2.5 mg by mouth every evening.     linagliptin (TRADJENTA) 5 MG TABS tablet Take 1 tablet (5 mg total) by mouth daily. 30 tablet 1   MAGNESIUM PO Take 1 tablet by mouth daily.     omeprazole (PRILOSEC) 40 MG capsule Take 40 mg by mouth daily.     pregabalin (LYRICA) 25 MG capsule Take 25 mg by mouth 2  (two) times daily.     rosuvastatin (CRESTOR) 10 MG tablet Take 1 tablet (10 mg total) by mouth every evening. 30 tablet 1   spironolactone (ALDACTONE) 25 MG tablet Take 25 mg by mouth daily. Tuesday, Thursday, Saturday and Sunday     SUMAtriptan (IMITREX) 100 MG tablet Take 100 mg by mouth every 2 (two) hours as needed for migraine.     vitamin C (ASCORBIC ACID) 250 MG tablet Take 250 mg by mouth daily.     vitamin E 200 UNIT capsule Take 200 Units by mouth daily.     allopurinol (ZYLOPRIM) 100 MG tablet Take 100 mg by mouth at bedtime.     Spirulina 500 MG TABS Take 2,000 mg by mouth daily. (Patient not taking: Reported on 03/22/2023)     No facility-administered medications prior to visit.    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 off anticoagulation   Bronchitis    Cancer (HCC)    Skin   Diabetes mellitus without complication (HCC)    Gout    H/O colonoscopy with polypectomy    Hypercholesteremia    Hypertension    Renal disorder    Renal insufficiency       Objective:     BP 137/73   Pulse 88   Ht 5\' 7"  (1.702 m)   Wt 226 lb (102.5 kg)   SpO2 92%   BMI 35.40 kg/m   SpO2: 92 % RA  Amb pleasant mod obese (by BMI ) wf nad    HEENT : Oropharynx  clear         NECK :  without  apparent JVD/ palpable Nodes/TM    LUNGS: no acc muscle use,  Nl contour chest which is clear to A and P bilaterally without cough on insp or exp maneuvers   CV:  RRR  no s3 or murmur or increase in P2, and no edema   ABD: obese  soft and nontender    MS:  Nl gait/ ext warm without deformities Or obvious joint restrictions  calf tenderness, cyanosis or clubbing    SKIN: warm and dry without lesions    NEURO:  alert, approp, nl sensorium with  no motor or cerebellar deficits apparent.   CXR PA and  Lateral:   05/24/2023 :    I personally reviewed images and impression is as follows:    CM / mild T kyphosis, no other findings   Labs ordered/ reviewed:      Chemistry  Component Value Date/Time   NA 144 05/24/2023 1505   K 4.3 05/24/2023 1505   CL 101 05/24/2023 1505   CO2 23 05/24/2023 1505   BUN 41 (H) 05/24/2023 1505   CREATININE 1.58 (H) 05/24/2023 1505   CREATININE 1.27 (H) 03/11/2020 0930      Component Value Date/Time   CALCIUM 10.3 05/24/2023 1505   ALKPHOS 64 06/19/2021 0526   AST 17 01/12/2022 0759   ALT 13 01/12/2022 0759   BILITOT 0.6 01/12/2022 0759     Lab Results  Component Value Date   CREATININE 1.58 (H) 05/24/2023   CREATININE 3.41 (H) 06/21/2021   CREATININE 3.83 (H) 06/20/2021       Lab Results  Component Value Date   WBC 9.4 05/24/2023   HGB 12.5 05/24/2023   HCT 38.0 05/24/2023   MCV 91 05/24/2023   PLT 203 05/24/2023     Lab Results  Component Value Date   DDIMER 0.43 05/24/2023      Lab Results  Component Value Date   TSH 1.770 05/24/2023          Lab Results  Component Value Date   ESRSEDRATE 9 05/24/2023          Assessment   DOE (dyspnea on exertion) Never smoker onset aug 2024 assoc with localized L ant cp sometimes present at rest, sometimes assoc with nausea but better since started on imdur - echo 03/30/23 ok  - Lexiscan 04/18/23    Findings are consistent with prior inferior/inferolatera/inferoapical infarction with mild to moderate peri-infarct ischemia. Low to intermediate risk study   No ST deviation was noted.   LV perfusion is abnormal. Moderate size mild to moderate intensity inferior/inferolateral/inferoapical defect with mild to moderate reversibility.   Left ventricular function is normal. Nuclear stress EF: 73%. The left ventricular ejection fraction is hyperdynamic (>65%). End diastolic cavity size is normal. - 05/24/2023 on RA  patient completed 300 ft  at a moderate pace, slowed down for another  17ft  d/t "twinge" in her chest but no sob/ lowest sats 92%  - 05/24/2023 increased AM coreg to 6.25 x 2 and continue  one in pm due to high resting pulse/ bp at ov  Symptoms are  disproportionate to objective findings and not clear to what extent this is actually a pulmonary  problem but pt does appear to have difficult to sort out respiratory symptoms of unknown origin for which  DDX  = almost all start with A and  include Adherence, Ace Inhibitors, Acid Reflux, Active Sinus Disease, Alpha 1 Antitripsin deficiency, Anxiety masquerading as Airways dz,  ABPA,  Allergy(esp in young), Aspiration (esp in elderly), Adverse effects of meds,  Active smoking or Vaping, A bunch of PE's/clot burden (a few small clots can't cause this syndrome unless there is already severe underlying pulm or vascular dz with poor reserve),  Anemia or thyroid disorder, plus two Bs  = Bronchiectasis and Beta blocker use..and one C= CHF    Adherence is always the initial "prime suspect" and is a multilayered concern that requires a "trust but verify" approach in every patient - starting with knowing how to use medications, especially inhalers, correctly, keeping up with refills and understanding the fundamental difference between maintenance and prns vs those medications only taken for a very short course and then stopped and not refilled.  - return with all meds in hand using a trust but verify approach to confirm accurate Medication  Reconciliation The principal here is that until  we are certain that the  patients are doing what we've asked, it makes no sense to ask them to do more.   ? Acid (or non-acid) GERD > always difficult to exclude as up to 75% of pts in some series report no assoc GI/ Heartburn symptoms> rec continue max (24h)  acid suppression and diet restrictions/ reviewed     ? Allergy /asthma > check profile / no need for inhalers for now   ? Active sinus dz > lifelong pnds/ nasal congestion might cause cough but  not likely doe unless causes sinobronchial reflex or cough > gerd > LPR with laryngospasm unlikely here by hx   ? Anemia/ thyroid dz > check labs  ?  A bunch of PEs > cp always in exact same location while on doac so both features rule strongly against mulitple PE, will get baseline dd imer for future reference   ? Chf > echo/lexiscan and response to imdur all point to some reversible ishemia occurrying > increased coreg today and referred back to Dr Lauren Hamilton for f/u.       Exertional chest pain Onset aug 2024 assoc with nausea improved on BB/ Nitrates   Ddx includes Angina of course and w/u in progress by Dr Lauren Hamilton.  Other concerns are GERD and splenic flexure syndrome which can be treated empirically effectively as per AVS but I don't see an obvious lung or pleural process here and pulmonary f/u can be prn.    Each maintenance medication was reviewed in detail including emphasizing most importantly the difference between maintenance and prns and under what circumstances the prns are to be triggered using an action plan format where appropriate.  Total time for H and P, chart review, counseling,  directly observing portions of ambulatory 02 saturation study/ and generating customized AVS unique to this office visit / same day charting  > 60 min complex new pt eval.                   Sandrea Hughs, MD 05/24/2023

## 2023-05-23 NOTE — Telephone Encounter (Signed)
Can increase imdur to 60mg  daily   Dominga Ferry MD     Patient declined medication change at this time. Stated that on the next day she went shopping on Friday and she was hurting, but bearable. So she started taking Cayenne Pepper since then and the pain has eased up some. Gave BP reading as of last night 112/64 9:00 p.m. Advised her that will let provider know.

## 2023-05-24 ENCOUNTER — Encounter: Payer: Self-pay | Admitting: Internal Medicine

## 2023-05-24 ENCOUNTER — Ambulatory Visit (HOSPITAL_COMMUNITY)
Admission: RE | Admit: 2023-05-24 | Discharge: 2023-05-24 | Disposition: A | Discharge status: Home / Self Care | Payer: HMO | Source: Ambulatory Visit | Attending: Internal Medicine | Admitting: Internal Medicine

## 2023-05-24 ENCOUNTER — Ambulatory Visit (INDEPENDENT_AMBULATORY_CARE_PROVIDER_SITE_OTHER): Payer: HMO | Admitting: Internal Medicine

## 2023-05-24 VITALS — BP 137/73 | HR 88 | Ht 67.0 in | Wt 226.0 lb

## 2023-05-24 DIAGNOSIS — G5602 Carpal tunnel syndrome, left upper limb: Secondary | ICD-10-CM | POA: Insufficient documentation

## 2023-05-24 DIAGNOSIS — R079 Chest pain, unspecified: Secondary | ICD-10-CM | POA: Diagnosis not present

## 2023-05-24 DIAGNOSIS — R0609 Other forms of dyspnea: Secondary | ICD-10-CM

## 2023-05-24 DIAGNOSIS — R0602 Shortness of breath: Secondary | ICD-10-CM | POA: Diagnosis not present

## 2023-05-24 DIAGNOSIS — M810 Age-related osteoporosis without current pathological fracture: Secondary | ICD-10-CM | POA: Insufficient documentation

## 2023-05-24 NOTE — Assessment & Plan Note (Signed)
Onset aug 2024 assoc with nausea improved on BB/ Nitrates   Ddx includes Angina of course and w/u in progress by Dr Wyline Mood.  Other concerns are GERD and splenic flexure syndrome which can be treated empirically effectively as per AVS but I don't see an obvious lung or pleural process here and pulmonary f/u can be prn.    Each maintenance medication was reviewed in detail including emphasizing most importantly the difference between maintenance and prns and under what circumstances the prns are to be triggered using an action plan format where appropriate.  Total time for H and P, chart review, counseling,  directly observing portions of ambulatory 02 saturation study/ and generating customized AVS unique to this office visit / same day charting  > 60 min complex new pt eval.

## 2023-05-24 NOTE — Assessment & Plan Note (Addendum)
Never smoker onset aug 2024 assoc with localized L ant cp sometimes present at rest, sometimes assoc with nausea but better since started on imdur - echo 03/30/23 ok  - Lexiscan 04/18/23    Findings are consistent with prior inferior/inferolatera/inferoapical infarction with mild to moderate peri-infarct ischemia. Low to intermediate risk study   No ST deviation was noted.   LV perfusion is abnormal. Moderate size mild to moderate intensity inferior/inferolateral/inferoapical defect with mild to moderate reversibility.   Left ventricular function is normal. Nuclear stress EF: 73%. The left ventricular ejection fraction is hyperdynamic (>65%). End diastolic cavity size is normal. - 05/24/2023 on RA  patient completed 300 ft  at a moderate pace, slowed down for another 148ft  d/t "twinge" in her chest but no sob/ lowest sats 92%  - 05/24/2023 increased AM coreg to 6.25 x 2 and continue  one in pm due to high resting pulse/ bp at ov - Allergy screen  05/24/23 >  Eos 0.3 /  IgE 10   Symptoms are  disproportionate to objective findings and not clear to what extent this is actually a pulmonary  problem but pt does appear to have difficult to sort out respiratory symptoms of unknown origin for which  DDX  = almost all start with A and  include Adherence, Ace Inhibitors, Acid Reflux, Active Sinus Disease, Alpha 1 Antitripsin deficiency, Anxiety masquerading as Airways dz,  ABPA,  Allergy(esp in young), Aspiration (esp in elderly), Adverse effects of meds,  Active smoking or Vaping, A bunch of PE's/clot burden (a few small clots can't cause this syndrome unless there is already severe underlying pulm or vascular dz with poor reserve),  Anemia or thyroid disorder, plus two Bs  = Bronchiectasis and Beta blocker use..and one C= CHF    Adherence is always the initial "prime suspect" and is a multilayered concern that requires a "trust but verify" approach in every patient - starting with knowing how to use  medications, especially inhalers, correctly, keeping up with refills and understanding the fundamental difference between maintenance and prns vs those medications only taken for a very short course and then stopped and not refilled.  - return with all meds in hand using a trust but verify approach to confirm accurate Medication  Reconciliation The principal here is that until we are certain that the  patients are doing what we've asked, it makes no sense to ask them to do more.   ? Acid (or non-acid) GERD > always difficult to exclude as up to 75% of pts in some series report no assoc GI/ Heartburn symptoms> rec continue max (24h)  acid suppression and diet restrictions/ reviewed     ? Allergy /asthma >  profile not suggestive  / no need for inhalers for now   ? Active sinus dz > lifelong pnds/ nasal congestion might cause cough but not likely doe unless causes sinobronchial reflex or cough > gerd > LPR with laryngospasm unlikely here by hx   ? Anemia/ thyroid dz > check labs  ?  A bunch of PEs > cp always in exact same location while on doac so both features rule strongly against mulitple PE, will get baseline dd imer for future reference =  0.43    ? Chf >  note BNP only 70 and echo ok but lexiscan and response to imdur all point to some reversible ishemia occurrying > increased coreg today and referred back to Dr Wyline Mood for f/u.

## 2023-05-24 NOTE — Patient Instructions (Addendum)
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    Treatment consists of avoiding foods that cause gas (especially boiled eggs, [place] food but especially  beans and undercooked vegetables like  spinach and some salads)  and citrucel 1 heaping tbsp twice daily with a large glass of water.  Pain should improve w/in 2 weeks and if not then not likely to be related to gas pain.    Please remember to go to the lab department   for your tests - we will call you with the results when they are available.      Please remember to go to the  x-ray department  @  [facility] for your tests - we will call you with the results when they are available     Late add : instructed:  Pantoprazole (protonix) 40 mg   Take  30-60 min before first meal of the day and Pepcid (famotidine)  20 mg after supper  this is the best way to tell whether stomach acid is contributing to your problem.

## 2023-05-26 LAB — CBC WITH DIFFERENTIAL/PLATELET
Basophils Absolute: 0 10*3/uL (ref 0.0–0.2)
Basos: 0 %
EOS (ABSOLUTE): 0.3 10*3/uL (ref 0.0–0.4)
Eos: 3 %
Hematocrit: 38 % (ref 34.0–46.6)
Hemoglobin: 12.5 g/dL (ref 11.1–15.9)
Immature Grans (Abs): 0 10*3/uL (ref 0.0–0.1)
Immature Granulocytes: 0 %
Lymphocytes Absolute: 2.7 10*3/uL (ref 0.7–3.1)
Lymphs: 29 %
MCH: 29.9 pg (ref 26.6–33.0)
MCHC: 32.9 g/dL (ref 31.5–35.7)
MCV: 91 fL (ref 79–97)
Monocytes Absolute: 0.6 10*3/uL (ref 0.1–0.9)
Monocytes: 7 %
Neutrophils Absolute: 5.7 10*3/uL (ref 1.4–7.0)
Neutrophils: 61 %
Platelets: 203 10*3/uL (ref 150–450)
RBC: 4.18 x10E6/uL (ref 3.77–5.28)
RDW: 14.8 % (ref 11.7–15.4)
WBC: 9.4 10*3/uL (ref 3.4–10.8)

## 2023-05-26 LAB — BASIC METABOLIC PANEL
BUN/Creatinine Ratio: 26 (ref 12–28)
BUN: 41 mg/dL — ABNORMAL HIGH (ref 8–27)
CO2: 23 mmol/L (ref 20–29)
Calcium: 10.3 mg/dL (ref 8.7–10.3)
Chloride: 101 mmol/L (ref 96–106)
Creatinine, Ser: 1.58 mg/dL — ABNORMAL HIGH (ref 0.57–1.00)
Glucose: 98 mg/dL (ref 70–99)
Potassium: 4.3 mmol/L (ref 3.5–5.2)
Sodium: 144 mmol/L (ref 134–144)
eGFR: 34 mL/min/{1.73_m2} — ABNORMAL LOW (ref >59)

## 2023-05-26 LAB — IGE: IgE (Immunoglobulin E), Serum: 10 [IU]/mL (ref 6–495)

## 2023-05-26 LAB — BRAIN NATRIURETIC PEPTIDE: BNP: 26.8 pg/mL (ref 0.0–100.0)

## 2023-05-26 LAB — D-DIMER, QUANTITATIVE: D-DIMER: 0.43 mg{FEU}/L (ref 0.00–0.49)

## 2023-05-26 LAB — TSH: TSH: 1.77 u[IU]/mL (ref 0.450–4.500)

## 2023-05-26 LAB — SEDIMENTATION RATE: Sed Rate: 9 mm/h (ref 0–40)

## 2023-06-11 DIAGNOSIS — G4733 Obstructive sleep apnea (adult) (pediatric): Secondary | ICD-10-CM | POA: Diagnosis not present

## 2023-06-16 ENCOUNTER — Institutional Professional Consult (permissible substitution): Payer: HMO | Admitting: Internal Medicine

## 2023-07-17 DIAGNOSIS — G43909 Migraine, unspecified, not intractable, without status migrainosus: Secondary | ICD-10-CM | POA: Diagnosis not present

## 2023-07-17 DIAGNOSIS — R52 Pain, unspecified: Secondary | ICD-10-CM | POA: Diagnosis not present

## 2023-07-17 DIAGNOSIS — I10 Essential (primary) hypertension: Secondary | ICD-10-CM | POA: Diagnosis not present

## 2023-07-17 DIAGNOSIS — Z299 Encounter for prophylactic measures, unspecified: Secondary | ICD-10-CM | POA: Diagnosis not present

## 2023-08-01 ENCOUNTER — Encounter: Payer: Self-pay | Admitting: Nurse Practitioner

## 2023-08-01 ENCOUNTER — Ambulatory Visit: Payer: HMO | Attending: Nurse Practitioner | Admitting: Nurse Practitioner

## 2023-08-01 VITALS — BP 130/80 | HR 72 | Ht 67.0 in | Wt 225.0 lb

## 2023-08-01 DIAGNOSIS — R079 Chest pain, unspecified: Secondary | ICD-10-CM

## 2023-08-01 DIAGNOSIS — Z86711 Personal history of pulmonary embolism: Secondary | ICD-10-CM | POA: Diagnosis not present

## 2023-08-01 DIAGNOSIS — R0609 Other forms of dyspnea: Secondary | ICD-10-CM | POA: Diagnosis not present

## 2023-08-01 DIAGNOSIS — N1832 Chronic kidney disease, stage 3b: Secondary | ICD-10-CM

## 2023-08-01 DIAGNOSIS — R0602 Shortness of breath: Secondary | ICD-10-CM

## 2023-08-01 MED ORDER — ISOSORBIDE MONONITRATE ER 30 MG PO TB24: 45.0000 mg | ORAL_TABLET | Freq: Every day | ORAL | 1 refills | Status: DC

## 2023-08-01 NOTE — Progress Notes (Unsigned)
Cardiology Office Note:  .   Date:  08/01/2023 ID:  Lauren Hamilton, DOB 1945-09-15, MRN [id number] PCP: Kirstie Peri, MD  [facility] Providers Cardiologist:  Dina Rich, MD    History of Present Illness: .   Lauren Hamilton is a 78 y.o. female with a PMH of chest pain, hypertension, type 2 diabetes, history of bilateral PE, hypercholesterolemia, leg edema, and CKD, who presents today for 60-month follow-up. Followed by Dr. Sherene Sires and Dr. Wolfgang Phoenix.   Last seen by Dr. Dina Rich on April 28, 2023.  She added noticed progressing dyspnea on exertion and chest pains over the past month and a half at the time.  Her chest pain was noted to be somewhat atypical but was noted to have an exertional component.  Lexiscan suggested areas of peri-infarct ischemia, low to intermediate risk.  Imdur 15 mg daily was added to medication regimen, was recommended to titrate Imdur over time.  Due to her CKD D with GFR in the 30s, was noted to have very high threshold to consider cardiac catheterization.  Today she presents for 40-month follow-up.  She states her shortness of breath remains the same, not able to be as active at [employer] as she previously was. Continues to go to exercise around 3 times per week. CC is her shortness of breath with exertion. CP is better per her report. Now located more to left lateral anterior side of chest, denies any radiation or alleviating/aggravating factors. Described as a twinge, typically noted 2-3 times per day, not associated with exertion. 2-3 on 0-10 pain scale. Denies any palpitations, syncope, presyncope, dizziness, orthopnea, PND, swelling or significant weight changes, acute bleeding, or claudication.  ROS: Negative. See HPI.   Studies Reviewed: [name]    Lexiscan 04/2023:    Findings are consistent with prior inferior/inferolatera/inferoapical infarction with mild to moderate peri-infarct ischemia. Low to intermediate risk study   No ST deviation  was noted.   LV perfusion is abnormal. Moderate size mild to moderate intensity inferior/inferolateral/inferoapical defect with mild to moderate reversibility.   Left ventricular function is normal. Nuclear stress EF: 73%. The left ventricular ejection fraction is hyperdynamic (>65%). End diastolic cavity size is normal.  Echo 03/2023:  1. Left ventricular ejection fraction, by estimation, is 60 to 65%. The  left ventricle has normal function. The left ventricle has no regional  wall motion abnormalities. There is mild left ventricular hypertrophy.  Left ventricular diastolic parameters  are consistent with Grade I diastolic dysfunction (impaired relaxation).   2. Right ventricular systolic function is normal. The right ventricular  size is normal. There is normal pulmonary artery systolic pressure. The  estimated right ventricular systolic pressure is 26.6 mmHg.   3. Left atrial size was mildly dilated.   4. The mitral valve is degenerative. Trivial mitral valve regurgitation.  No evidence of mitral stenosis.   5. The aortic valve is tricuspid. There is mild calcification of the  aortic valve. Aortic valve regurgitation is not visualized. Aortic valve  sclerosis/calcification is present, without any evidence of aortic  stenosis.   6. The inferior vena cava is normal in size with greater than 50%  respiratory variability, suggesting right atrial pressure of 3 mmHg.   Physical Exam:   VS:  BP 130/80   Pulse 72   Ht 5\' 7"  (1.702 m)   Wt 225 lb (102.1 kg)   SpO2 99%   BMI 35.24 kg/m    Wt Readings from Last 3 Encounters:  08/01/23  225 lb (102.1 kg)  05/24/23 226 lb (102.5 kg)  04/28/23 224 lb (101.6 kg)    GEN: Obese, 78 y.o. female in no acute distress NECK: No JVD; No carotid bruits CARDIAC: S1/S2, RRR, no murmurs, rubs, gallops RESPIRATORY:  Clear to auscultation without rales, wheezing or rhonchi  ABDOMEN: Soft, non-tender, non-distended EXTREMITIES:  No edema; No deformity    ASSESSMENT AND PLAN: .    Chest pain Improved CP since last office visit, most bothersome and CC is DOE - see below. See NST from last year noted above. Will increase Imdur to 45 mg daily. Agree with Dr. Wyline Mood that would have very high threshold to consider cardiac cath d/t hx of CKD. No other medication changes at this time. Care and ED precautions discussed.   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 Echo revealed normal LVEF, no worrisome findings. See NST as noted above. Increasing Imdur as noted above. Will arrange pulmonary function test for further evaluation. No medication changes. Care and ED precautions discussed.   Hx of PE  Continue Eliquis 5 mg BID. Continue to follow with PCP.  CKD stage 3b Most recent sCr was 1.58 with eGFR at 34. Avoid nephrotoxic agents. No medication changes besides what is noted above. Continue to follow with PCP.   Dispo: Follow-up with me/APP in 3 months or sooner if anything changes.   Signed, Sharlene Dory, NP

## 2023-08-01 NOTE — Patient Instructions (Addendum)
Medication Instructions:  Your physician has recommended you make the following change in your medication:  Please IMDUR to 45 mg daily   Labwork: None   Testing/Procedures: Your physician has recommended that you have a pulmonary function test. Pulmonary Function Tests are a group of tests that measure how well air moves in and out of your lungs.  Follow-Up: Your physician recommends that you schedule a follow-up appointment in: 3 Month   Any Other Special Instructions Will Be Listed Below (If Applicable).  If you need a refill on your cardiac medications before your next appointment, please call your pharmacy.

## 2023-08-04 ENCOUNTER — Telehealth: Payer: Self-pay | Admitting: Nurse Practitioner

## 2023-08-04 NOTE — Telephone Encounter (Signed)
Checking percert on the following patient for testing scheduled at [facility].    PFT'S   08/29/2023

## 2023-08-11 DIAGNOSIS — D631 Anemia in chronic kidney disease: Secondary | ICD-10-CM | POA: Diagnosis not present

## 2023-08-11 DIAGNOSIS — R809 Proteinuria, unspecified: Secondary | ICD-10-CM | POA: Diagnosis not present

## 2023-08-11 DIAGNOSIS — N189 Chronic kidney disease, unspecified: Secondary | ICD-10-CM | POA: Diagnosis not present

## 2023-08-15 DIAGNOSIS — J069 Acute upper respiratory infection, unspecified: Secondary | ICD-10-CM | POA: Diagnosis not present

## 2023-08-15 DIAGNOSIS — I10 Essential (primary) hypertension: Secondary | ICD-10-CM | POA: Diagnosis not present

## 2023-08-15 DIAGNOSIS — E1165 Type 2 diabetes mellitus with hyperglycemia: Secondary | ICD-10-CM | POA: Diagnosis not present

## 2023-08-15 DIAGNOSIS — Z299 Encounter for prophylactic measures, unspecified: Secondary | ICD-10-CM | POA: Diagnosis not present

## 2023-08-15 DIAGNOSIS — R058 Other specified cough: Secondary | ICD-10-CM | POA: Diagnosis not present

## 2023-08-15 DIAGNOSIS — E1142 Type 2 diabetes mellitus with diabetic polyneuropathy: Secondary | ICD-10-CM | POA: Diagnosis not present

## 2023-08-17 DIAGNOSIS — N1832 Chronic kidney disease, stage 3b: Secondary | ICD-10-CM | POA: Diagnosis not present

## 2023-08-17 DIAGNOSIS — E1129 Type 2 diabetes mellitus with other diabetic kidney complication: Secondary | ICD-10-CM | POA: Diagnosis not present

## 2023-08-17 DIAGNOSIS — I5032 Chronic diastolic (congestive) heart failure: Secondary | ICD-10-CM | POA: Diagnosis not present

## 2023-08-17 DIAGNOSIS — E1122 Type 2 diabetes mellitus with diabetic chronic kidney disease: Secondary | ICD-10-CM | POA: Diagnosis not present

## 2023-08-29 ENCOUNTER — Ambulatory Visit (HOSPITAL_COMMUNITY)
Admission: RE | Admit: 2023-08-29 | Discharge: 2023-08-29 | Disposition: A | Discharge status: Home / Self Care | Payer: HMO | Source: Ambulatory Visit | Attending: Nurse Practitioner | Admitting: Nurse Practitioner

## 2023-08-29 DIAGNOSIS — R0609 Other forms of dyspnea: Secondary | ICD-10-CM | POA: Insufficient documentation

## 2023-08-29 LAB — PULMONARY FUNCTION TEST
DL/VA % pred: 81 %
DL/VA: 3.31 ml/min/mmHg/L
DLCO unc % pred: 65 %
DLCO unc: 13.63 ml/min/mmHg
FEF 25-75 Post: 2.69 L/s
FEF 25-75 Pre: 1.13 L/s
FEF2575-%Change-Post: 138 %
FEF2575-%Pred-Post: 155 %
FEF2575-%Pred-Pre: 65 %
FEV1-%Change-Post: 32 %
FEV1-%Pred-Post: 92 %
FEV1-%Pred-Pre: 69 %
FEV1-Post: 2.15 L
FEV1-Pre: 1.63 L
FEV1FVC-%Change-Post: 9 %
FEV1FVC-%Pred-Pre: 97 %
FEV6-%Change-Post: 21 %
FEV6-%Pred-Post: 91 %
FEV6-%Pred-Pre: 75 %
FEV6-Post: 2.71 L
FEV6-Pre: 2.23 L
FEV6FVC-%Change-Post: 0 %
FEV6FVC-%Pred-Post: 105 %
FEV6FVC-%Pred-Pre: 104 %
FVC-%Change-Post: 20 %
FVC-%Pred-Post: 87 %
FVC-%Pred-Pre: 72 %
FVC-Post: 2.71 L
FVC-Pre: 2.25 L
Post FEV1/FVC ratio: 79 %
Post FEV6/FVC ratio: 100 %
Pre FEV1/FVC ratio: 72 %
Pre FEV6/FVC Ratio: 99 %
RV % pred: 74 %
RV: 1.84 L
TLC % pred: 86 %
TLC: 4.79 L

## 2023-08-29 MED ORDER — ALBUTEROL SULFATE (2.5 MG/3ML) 0.083% IN NEBU
2.5000 mg | INHALATION_SOLUTION | Freq: Once | RESPIRATORY_TRACT | Status: AC
  Administered 2023-08-29: 2.5 mg via RESPIRATORY_TRACT

## 2023-09-07 ENCOUNTER — Other Ambulatory Visit: Payer: Self-pay | Admitting: Nurse Practitioner

## 2023-09-07 DIAGNOSIS — R0602 Shortness of breath: Secondary | ICD-10-CM

## 2023-09-07 DIAGNOSIS — I2602 Saddle embolus of pulmonary artery with acute cor pulmonale: Secondary | ICD-10-CM

## 2023-09-09 DIAGNOSIS — G4733 Obstructive sleep apnea (adult) (pediatric): Secondary | ICD-10-CM | POA: Diagnosis not present

## 2023-09-12 DIAGNOSIS — L218 Other seborrheic dermatitis: Secondary | ICD-10-CM | POA: Diagnosis not present

## 2023-09-12 DIAGNOSIS — Z85820 Personal history of malignant melanoma of skin: Secondary | ICD-10-CM | POA: Diagnosis not present

## 2023-09-12 DIAGNOSIS — L570 Actinic keratosis: Secondary | ICD-10-CM | POA: Diagnosis not present

## 2023-09-12 DIAGNOSIS — Z85828 Personal history of other malignant neoplasm of skin: Secondary | ICD-10-CM | POA: Diagnosis not present

## 2023-10-17 ENCOUNTER — Ambulatory Visit: Admitting: Adult Health

## 2023-10-17 ENCOUNTER — Encounter: Payer: Self-pay | Admitting: Adult Health

## 2023-10-17 VITALS — BP 130/72 | HR 87 | Temp 98.4°F | Ht 67.0 in | Wt 225.2 lb

## 2023-10-17 DIAGNOSIS — I2602 Saddle embolus of pulmonary artery with acute cor pulmonale: Secondary | ICD-10-CM

## 2023-10-17 DIAGNOSIS — R0609 Other forms of dyspnea: Secondary | ICD-10-CM

## 2023-10-17 DIAGNOSIS — G4733 Obstructive sleep apnea (adult) (pediatric): Secondary | ICD-10-CM

## 2023-10-17 DIAGNOSIS — J4530 Mild persistent asthma, uncomplicated: Secondary | ICD-10-CM | POA: Diagnosis not present

## 2023-10-17 DIAGNOSIS — J310 Chronic rhinitis: Secondary | ICD-10-CM

## 2023-10-17 DIAGNOSIS — I2782 Chronic pulmonary embolism: Secondary | ICD-10-CM

## 2023-10-17 DIAGNOSIS — N1832 Chronic kidney disease, stage 3b: Secondary | ICD-10-CM

## 2023-10-17 DIAGNOSIS — J42 Unspecified chronic bronchitis: Secondary | ICD-10-CM | POA: Diagnosis not present

## 2023-10-17 MED ORDER — FLUTICASONE FUROATE-VILANTEROL 100-25 MCG/ACT IN AEPB: 1.0000 | INHALATION_SPRAY | Freq: Every day | RESPIRATORY_TRACT | 5 refills | Status: DC

## 2023-10-17 NOTE — Progress Notes (Unsigned)
 @Patient  ID: Lauren Hamilton, female    DOB: 1946-04-10, 78 y.o.   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 Low to intermediate risk study   No ST deviation was noted.   LV perfusion is abnormal. Moderate size mild to moderate intensity inferior/inferolateral/inferoapical defect with mild to moderate reversibility.   Left ventricular function is normal. Nuclear stress EF: 73%. The left ventricular ejection fraction is hyperdynamic (>65%). End diastolic cavity size is normal. 07/24/2022 on RA  patient completed 300 ft  at a moderate pace, slowed down for another 176ft  d/t "twinge" in her chest but no sob/ lowest sats 92%  Chest x-ray May 24, 2023 coarsened interstitial markings.  10/17/2023 Acute OV : Dyspnea   Discussed the use of AI scribe software for clinical note transcription with the patient, who gave verbal consent to proceed.  History of Present Illness   Lauren Hamilton is a 78 year old female presents for an acute office visit for worsening shortness of breath. She has experienced worsening shortness of breath since August 2024, with significant breathlessness occurring with minimal exertion, such as walking short distances. Previously, she was able to participate in exercise classes standing up, but now she can only do them sitting down. She describes a decline in her physical  capabilities over the last 6 months.  She has a history of recurrent PE on lifelong anticoagulation.  Endorses compliance.  . An echocardiogram in October 2024 showed normal EF and normal pulmonary artery systolic pressure.  Chest x-ray December 2024 showed coarsened interstitial markings with no acute process.  Patient says she has history of recurrent bronchitis and sinusitis. She experiences chronic sinus drainage. She has episodes of coughing, .  Says recently has been doing better with cough and sinus drainage.  She has a history of sinus infections leading to bronchitis multiple times a year until she started using an antibiotic in her nasal irrigation, which reduced the frequency of infections.  Her past medical history includes chronic kidney disease she has sleep apnea is on CPAP at bedtime.  She denies any history of autoimmune disorder.  there is no family history of major autoimmune or connective tissue disorders.  She does not smoke and has no significant exposure to chemicals or asbestos. She has outside chickens but does not interact with them. She reports possible mold in her daughter's bathroom but does not have significant direct exposure to it.      Pulmonary function testing done August 29, 2023 showed mild to moderate restriction and airflow obstruction with complete reversibility consistent with asthma.  FEV1 69%, ratio 72, FVC 72%, post bronchodilator response with a 32% change, FEV1 postbronchodilator 92%, ratio 79, FVC 87%, DLCO 65%.  Walk test today in the office shows no significant desaturations on room air.   Allergies  Allergen Reactions   Linagliptin  Shortness Of Breath and Cough   Latex Rash    Reaction is only with internal contact  Clonidine Derivatives     Dizzy, dry mouth   Etodolac     Unknown reaction   Hydralazine      Pt unsure of reaction    Hydrocodone Other (See Comments)    Altered mental status-Hallucination, tolerated in low doses     Naproxen Other (See Comments)    Damage to kidney    Statins Cough     tolerates lovastatin    Tramadol     Patient, "Felt funny, didn't feel right, and felt weird."   Amlodipine Cough   Lisinopril  Cough   Losartan Cough     There is no immunization history on file for this patient.  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 off anticoagulation   Bronchitis    Cancer (HCC)    Skin   Diabetes mellitus without complication (HCC)    Gout    H/O colonoscopy with polypectomy    Hypercholesteremia    Hypertension    Renal disorder    Renal insufficiency     Tobacco History: Social History   Tobacco Use  Smoking Status Never   Passive exposure: Past  Smokeless Tobacco Never   Counseling given: Not Answered   Outpatient Medications Prior to Visit  Medication Sig Dispense Refill   allopurinol  (ZYLOPRIM ) 300 MG tablet Take 300 mg by mouth daily.     apixaban  (ELIQUIS ) 5 MG TABS tablet Take 1 tablet (5 mg total) by mouth 2 (two) times daily. 60 tablet 1   Artificial Tear Solution (SOOTHE XP) SOLN Apply 1-2 drops to eye as needed (dry eyes).      Ascorbic Acid (VITAMIN C PO) Take 1 tablet by mouth daily.     Capsicum, Cayenne, (CAYENNE PEPPER PO) Take by mouth.     carvedilol  (COREG ) 6.25 MG tablet Take 1 tablet (6.25 mg total) by mouth 2 (two) times daily with a meal. 60 tablet 1   clobetasol (TEMOVATE) 0.05 % external solution Apply 1 Application topically.     colchicine 0.6 MG tablet Take 0.6 mg by mouth 2 (two) times daily.     dapagliflozin propanediol (FARXIGA) 5 MG TABS tablet Take 5 mg by mouth daily. Monday, Wednesday and Friday     famotidine (PEPCID) 40 MG tablet Take 40 mg by mouth at bedtime.     furosemide  (LASIX ) 20 MG tablet Take 40 mg by mouth 2 (two) times daily.      isosorbide  mononitrate (IMDUR ) 30 MG 24 hr tablet Take 1.5 tablets (45 mg total) by mouth daily. 135 tablet 1   ketoconazole (NIZORAL) 2 % shampoo Apply 1 Application topically 2 (two) times a week.     levocetirizine (XYZAL) 5 MG tablet Take 2.5 mg by mouth every evening.     linagliptin  (TRADJENTA ) 5 MG TABS tablet Take 1 tablet (5 mg total) by mouth daily. 30 tablet 1   MAGNESIUM  PO Take 1 tablet by mouth daily.     omeprazole (PRILOSEC) 40 MG capsule Take 40 mg by mouth daily.     rizatriptan (MAXALT) 10 MG tablet Take 10 mg by mouth daily as needed.     rosuvastatin  (CRESTOR ) 10 MG tablet Take 1 tablet (10 mg total) by mouth every evening. 30 tablet 1   spironolactone  (ALDACTONE ) 25 MG tablet Take 25 mg by mouth daily. Tuesday,  Thursday, Saturday and Sunday     SUMAtriptan (IMITREX) 100 MG tablet Take 100 mg by mouth every 2 (two) hours as needed for migraine.     vitamin C (ASCORBIC ACID) 250 MG tablet Take 250 mg by mouth daily.     vitamin E 200 UNIT capsule Take 200 Units by mouth daily.     pregabalin (LYRICA) 25 MG capsule Take 25 mg by mouth 2 (two) times daily.     No facility-administered medications prior to visit.     Review of Systems:   Constitutional:   No  weight loss, night sweats,  Fevers, chills,+ fatigue, or  lassitude.  HEENT:   No headaches,  Difficulty swallowing,  Tooth/dental problems, or  Sore throat,                No sneezing, itching, ear ache,+ nasal congestion, post nasal drip,   CV:  No chest pain,  Orthopnea, PND, swelling in lower extremities, anasarca, dizziness, palpitations, syncope.   GI  No heartburn, indigestion, abdominal pain, nausea, vomiting, diarrhea, change in bowel habits, loss of appetite, bloody stools.   Resp: .  No chest wall deformity  Skin: no rash or lesions.  GU: no dysuria, change in color of urine, no urgency or frequency.  No flank pain, no hematuria   MS:  No joint pain or swelling.  No decreased range of motion.  No  back pain.    Physical Exam  BP 130/72 (BP Location: Left Arm, Patient Position: Sitting, Cuff Size: Large)   Pulse 87   Temp 98.4 F (36.9 C) (Oral)   Ht 5\' 7"  (1.702 m)   Wt 225 lb 3.2 oz (102.2 kg)   SpO2 (!) 87% Comment: up to 96% with rest.  BMI 35.27 kg/m   GEN: A/Ox3; pleasant , NAD, well nourished    HEENT:  [place]/AT,  NOSE-clear, THROAT-clear, no lesions, no postnasal drip or exudate noted.   NECK:  Supple w/ fair ROM; no JVD; normal carotid impulses w/o bruits; no thyromegaly or nodules palpated; no lymphadenopathy.    RESP  Clear  P & A; w/o, wheezes/ rales/ or rhonchi. no accessory muscle use, no dullness to percussion  CARD:  RRR, no m/r/g, no peripheral edema, pulses intact, no cyanosis or clubbing.  GI:   Soft & nt; nml bowel sounds; no organomegaly or masses detected.   Musco: Warm bil, no deformities or joint swelling noted.   Neuro: alert, no focal deficits noted.    Skin: Warm, no lesions or rashes        BNP   ProBNP No results found for: "PROBNP"  Imaging: No results found.  Administration History     None          Latest Ref Rng & Units 08/29/2023    8:16 AM  PFT Results  FVC-Pre L 2.25   FVC-Predicted Pre % 72   FVC-Post L 2.71   FVC-Predicted Post % 87   Pre FEV1/FVC % % 72   Post FEV1/FCV % % 79   FEV1-Pre L 1.63   FEV1-Predicted Pre % 69   FEV1-Post L 2.15   DLCO uncorrected ml/min/mmHg 13.63   DLCO UNC% % 65   DLVA Predicted % 81   TLC L 4.79   TLC % Predicted % 86   RV % Predicted % 74     No results found for: "NITRICOXIDE"      Assessment & Plan:   Assessment and Plan  Asthma   Asthma is suspected based on recent pulmonary function tests, with symptoms of dyspnea and cough. Initiate Breo Ellipta, one puff daily, with instructions on use and advise rinsing the mouth after inhaler use. Monitor response and consider adding an albuterol  inhaler if needed.   Shortness of breath   Progressive dyspnea  since August remains unresponsive to current management. Differential includes asthma and possible pulmonary fibrosis-interstitial changes on chest xray, restriction and decreased diffucing capactiy on PFT- A high-resolution CT scan of the chest is planned to assess for pulmonary fibrosis. Encourage activity as tolerated, with rest as needed.  Chronic bronchitis  and sinusitis  Chronic bronchitis occurs often post-sinus infections, with no current acute exacerbation.  Sleep apnea   Sleep apnea is managed with regular CPAP use, with no current issues reported.  Blood clots   Recurrent blood clots are managed with anticoagulation therapy,   Chronic kidney disease   Chronic kidney disease is secondary to long-term NSAID use for back pain, managed with Farxiga for renal protection.  Keep follow-up with primary care and nephrology.  Follow-up   Schedule a follow-up appointment in 6-8 weeks to assess asthma treatment response and review CT scan results. Communicate findings from the CT scan.         Roena Clark, NP 10/17/2023

## 2023-10-17 NOTE — Patient Instructions (Addendum)
 Begin Breo 1 puff daily, rinse after use.  Set up HRCT chest .  Activity as tolerated.  Follow up in 6-8 weeks and As needed   Please contact office for sooner follow up if symptoms do not improve or worsen or seek emergency care

## 2023-10-18 ENCOUNTER — Telehealth (HOSPITAL_BASED_OUTPATIENT_CLINIC_OR_DEPARTMENT_OTHER): Payer: Self-pay

## 2023-10-18 NOTE — Telephone Encounter (Signed)
 Is pt still okay to take?    Copied from CRM [id number]. Topic: Clinical - Prescription Issue >> Oct 18, 2023  8:42 AM Juliana Ocean wrote: Reason for CRM: pt states the new inhaler Tammy prescribed yesterday (fluticasone furoate-vilanterol (BREO ELLIPTA) 100-25 MCG/ACT AEPB) MAY interact w/ her carvedilol  (COREG ) 6.25 MG tablet. Does Tammy still want pt to get this inhaler?  Pt has not picked up yet, will wait for answer.

## 2023-10-18 NOTE — Telephone Encounter (Signed)
Left pt message to notify

## 2023-10-18 NOTE — Telephone Encounter (Signed)
Yes it is okay to take

## 2023-10-27 DIAGNOSIS — M10072 Idiopathic gout, left ankle and foot: Secondary | ICD-10-CM | POA: Diagnosis not present

## 2023-10-27 DIAGNOSIS — I10 Essential (primary) hypertension: Secondary | ICD-10-CM | POA: Diagnosis not present

## 2023-10-27 DIAGNOSIS — R52 Pain, unspecified: Secondary | ICD-10-CM | POA: Diagnosis not present

## 2023-10-27 DIAGNOSIS — Z299 Encounter for prophylactic measures, unspecified: Secondary | ICD-10-CM | POA: Diagnosis not present

## 2023-10-31 ENCOUNTER — Ambulatory Visit: Payer: HMO | Admitting: Nurse Practitioner

## 2023-11-03 ENCOUNTER — Ambulatory Visit: Attending: Nurse Practitioner | Admitting: Nurse Practitioner

## 2023-11-03 ENCOUNTER — Other Ambulatory Visit (HOSPITAL_BASED_OUTPATIENT_CLINIC_OR_DEPARTMENT_OTHER): Payer: Self-pay | Admitting: Nurse Practitioner

## 2023-11-03 ENCOUNTER — Encounter: Payer: Self-pay | Admitting: Nurse Practitioner

## 2023-11-03 VITALS — BP 146/86 | HR 76 | Ht 67.0 in | Wt 220.0 lb

## 2023-11-03 DIAGNOSIS — Z86711 Personal history of pulmonary embolism: Secondary | ICD-10-CM

## 2023-11-03 DIAGNOSIS — R0609 Other forms of dyspnea: Secondary | ICD-10-CM

## 2023-11-03 DIAGNOSIS — Z13220 Encounter for screening for lipoid disorders: Secondary | ICD-10-CM | POA: Diagnosis not present

## 2023-11-03 DIAGNOSIS — N1832 Chronic kidney disease, stage 3b: Secondary | ICD-10-CM | POA: Diagnosis not present

## 2023-11-03 DIAGNOSIS — R079 Chest pain, unspecified: Secondary | ICD-10-CM | POA: Diagnosis not present

## 2023-11-03 DIAGNOSIS — J45909 Unspecified asthma, uncomplicated: Secondary | ICD-10-CM

## 2023-11-03 DIAGNOSIS — I10 Essential (primary) hypertension: Secondary | ICD-10-CM

## 2023-11-03 DIAGNOSIS — R0602 Shortness of breath: Secondary | ICD-10-CM

## 2023-11-03 DIAGNOSIS — E781 Pure hyperglyceridemia: Secondary | ICD-10-CM

## 2023-11-03 DIAGNOSIS — R7989 Other specified abnormal findings of blood chemistry: Secondary | ICD-10-CM

## 2023-11-03 MED ORDER — ISOSORBIDE MONONITRATE ER 60 MG PO TB24: 60.0000 mg | ORAL_TABLET | Freq: Every day | ORAL | 1 refills | Status: DC

## 2023-11-03 NOTE — Patient Instructions (Addendum)
 Medication Instructions:  Your physician has recommended you make the following change in your medication:  Please Increase IMDUR  to 60 Mg daily   Labwork: In 1 week at [facility]   Testing/Procedures: None   Follow-Up: Your physician recommends that you schedule a follow-up appointment in: 1 month   Any Other Special Instructions Will Be Listed Below (If Applicable).  If you need a refill on your cardiac medications before your next appointment, please call your pharmacy.

## 2023-11-03 NOTE — Progress Notes (Unsigned)
 Cardiology Office Note:  .   Date: 11/03/2023 ID:  Lauren Hamilton, DOB January 09, 1946, MRN [id number] PCP: Theoplis Fix, MD  [facility] Providers Cardiologist:  Armida Lander, MD    History of Present Illness: .   Lauren Hamilton is a 78 y.o. female with a PMH of chest pain, hypertension, type 2 diabetes, history of bilateral PE, hypercholesterolemia, leg edema, and CKD, who presents today for 58-month follow-up. Followed by Dr. Waymond Hailey and Dr. Carrolyn Clan.   Last seen by Dr. Armida Lander on April 28, 2023.  She added noticed progressing dyspnea on exertion and chest pains over the past month and a half at the time.  Her chest pain was noted to be somewhat atypical but was noted to have an exertional component.  Lexiscan  suggested areas of peri-infarct ischemia, low to intermediate risk.  Imdur  15 mg daily was added to medication regimen, was recommended to titrate Imdur  over time.  Due to her CKD D with GFR in the 30s, was noted to have very high threshold to consider cardiac catheterization.  08/01/2023 - Today she presents for 57-month follow-up.  She states her shortness of breath remains the same, not able to be as active at [employer] as she previously was. Continues to go to exercise around 3 times per week. CC is her shortness of breath with exertion. CP is better per her report. Now located more to left lateral anterior side of chest, denies any radiation or alleviating/aggravating factors. Described as a twinge, typically noted 2-3 times per day, not associated with exertion. 2-3 on 0-10 pain scale. Denies any palpitations, syncope, presyncope, dizziness, orthopnea, PND, swelling or significant weight changes, acute bleeding, or claudication.  11/03/2023 - Here for follow-up. Does admit to shortness of breath, says her pulmonology provider believes she has asthma and will be undergoing a CT scan of her chest soon.  Does admit to occasional twinges of chest pain.  Described as "like  a mild pulled muscle" and does not occur with activity and is on the left lateral side of her chest.  She says it is frustrating she is unable to do what she wants.  During exertion, she says her arms feel heavy.  This episode occurred day before yesterday and also all day yesterday.  Describes as mild sensation. Denies any palpitations, syncope, presyncope, dizziness, orthopnea, PND, swelling or significant weight changes, acute bleeding, or claudication.   ROS: Negative. See HPI.   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 Low to intermediate risk study   No ST deviation was noted.   LV perfusion is abnormal. Moderate size mild to moderate intensity inferior/inferolateral/inferoapical defect with mild to moderate reversibility.   Left ventricular function is normal. Nuclear stress EF: 73%. The left ventricular ejection fraction is hyperdynamic (>65%). End diastolic cavity size is normal.  Echo 03/2023:  1. Left ventricular ejection fraction, by estimation, is 60 to 65%. The  left ventricle has normal function. The left ventricle has no regional  wall motion  abnormalities. There is mild left ventricular hypertrophy.  Left ventricular diastolic parameters  are consistent with Grade I diastolic dysfunction (impaired relaxation).   2. Right ventricular systolic function is normal. The right ventricular  size is normal. There is normal pulmonary artery systolic pressure. The  estimated right  ventricular systolic pressure is 26.6 mmHg.   3. Left atrial size was mildly dilated.   4. The mitral valve is degenerative. Trivial mitral valve regurgitation.  No evidence of mitral stenosis.   5. The aortic valve is tricuspid. There is mild calcification of the  aortic valve. Aortic valve regurgitation is not visualized. Aortic valve  sclerosis/calcification is present, without any evidence of aortic  stenosis.   6. The inferior vena cava is normal in size with greater than 50%  respiratory variability, suggesting right atrial pressure of 3 mmHg.   Physical Exam:   VS:  BP (!) 146/86   Pulse 76   Ht 5\' 7"  (1.702 m)   Wt 220 lb (99.8 kg)   SpO2 98%   BMI 34.46 kg/m    Wt Readings from Last 3 Encounters:  11/03/23 220 lb (99.8 kg)  10/17/23 225 lb 3.2 oz (102.2 kg)  08/01/23 225 lb (102.1 kg)    GEN: Obese, 78 y.o. female in no acute distress NECK: No JVD; No carotid bruits CARDIAC: S1/S2, RRR, no murmurs, rubs, gallops RESPIRATORY:  Clear to auscultation without rales, wheezing or rhonchi  ABDOMEN: Soft, non-tender, non-distended EXTREMITIES:  No edema; No deformity   ASSESSMENT AND PLAN: .    Chest pain Atypical in etiology and has occurred recently within the past week-see HPI.  Will increase Imdur  to 60 mg daily.  Will obtain the following labs: CBC, CMET. See NST from last year noted above. Agree with Dr. Amanda Jungling that would have very high threshold to consider cardiac cath d/t hx of CKD. No other medication changes at this time. Care and ED precautions discussed.   Asthma, DOE Notes stable, chronic DOE. Echo revealed normal LVEF, no worrisome findings.  Has recent been diagnosed with asthma by her pulmonology NP.  Will obtain BNP and CBC as noted above.  No other medication changes besides what is noted above. Care and ED precautions discussed.   Hx of PE Denies any concerning signs of symptoms. Continue Eliquis  5 mg BID. Continue to follow with PCP. Will obtain CBC.    CKD stage 3b Most recent sCr was 1.58 with eGFR at 34. Avoid nephrotoxic agents. No medication changes besides what is noted above. Continue to follow with PCP. Due for repeat lab work and will obtain CMET as noted above.  5. Screening for hyperlipidemia She is due for labs to be checked and will obtain FLP in addition to labs as noted above.  Heart healthy diet recommended.  Dispo: Follow-up with me/APP in 3 months or sooner if anything changes.   Signed, Lasalle Pointer, NP

## 2023-11-04 LAB — CBC WITH DIFFERENTIAL/PLATELET
Absolute Lymphocytes: 2397 {cells}/uL (ref 850–3900)
Absolute Monocytes: 592 {cells}/uL (ref 200–950)
Basophils Absolute: 38 {cells}/uL (ref 0–200)
Basophils Relative: 0.4 %
Eosinophils Absolute: 197 {cells}/uL (ref 15–500)
Eosinophils Relative: 2.1 %
HCT: 38.9 % (ref 35.0–45.0)
Hemoglobin: 12.5 g/dL (ref 11.7–15.5)
MCH: 30 pg (ref 27.0–33.0)
MCHC: 32.1 g/dL (ref 32.0–36.0)
MCV: 93.3 fL (ref 80.0–100.0)
MPV: 10.8 fL (ref 7.5–12.5)
Monocytes Relative: 6.3 %
Neutro Abs: 6176 {cells}/uL (ref 1500–7800)
Neutrophils Relative %: 65.7 %
Platelets: 227 10*3/uL (ref 140–400)
RBC: 4.17 10*6/uL (ref 3.80–5.10)
RDW: 15.6 % — ABNORMAL HIGH (ref 11.0–15.0)
Total Lymphocyte: 25.5 %
WBC: 9.4 10*3/uL (ref 3.8–10.8)

## 2023-11-04 LAB — COMPREHENSIVE METABOLIC PANEL WITH GFR
AG Ratio: 1.8 (calc) (ref 1.0–2.5)
ALT: 19 U/L (ref 6–29)
AST: 17 U/L (ref 10–35)
Albumin: 4.4 g/dL (ref 3.6–5.1)
Alkaline phosphatase (APISO): 103 U/L (ref 37–153)
BUN/Creatinine Ratio: 32 (calc) — ABNORMAL HIGH (ref 6–22)
BUN: 54 mg/dL — ABNORMAL HIGH (ref 7–25)
CO2: 30 mmol/L (ref 20–32)
Calcium: 10.3 mg/dL (ref 8.6–10.4)
Chloride: 100 mmol/L (ref 98–110)
Creat: 1.68 mg/dL — ABNORMAL HIGH (ref 0.60–1.00)
Globulin: 2.5 g/dL (ref 1.9–3.7)
Glucose, Bld: 92 mg/dL (ref 65–99)
Potassium: 4.3 mmol/L (ref 3.5–5.3)
Sodium: 140 mmol/L (ref 135–146)
Total Bilirubin: 0.4 mg/dL (ref 0.2–1.2)
Total Protein: 6.9 g/dL (ref 6.1–8.1)
eGFR: 31 mL/min/{1.73_m2} — ABNORMAL LOW (ref >=60)

## 2023-11-04 LAB — BRAIN NATRIURETIC PEPTIDE: Brain Natriuretic Peptide: 17 pg/mL (ref <100)

## 2023-11-04 LAB — LIPID PANEL
Cholesterol: 141 mg/dL (ref <200)
HDL: 53 mg/dL (ref >=50)
LDL Cholesterol (Calc): 70 mg/dL
Non-HDL Cholesterol (Calc): 88 mg/dL (ref <130)
Total CHOL/HDL Ratio: 2.7 (calc) (ref <5.0)
Triglycerides: 101 mg/dL (ref <150)

## 2023-11-06 ENCOUNTER — Ambulatory Visit: Payer: Self-pay | Admitting: Nurse Practitioner

## 2023-11-13 ENCOUNTER — Ambulatory Visit (HOSPITAL_COMMUNITY)
Admission: RE | Admit: 2023-11-13 | Discharge: 2023-11-13 | Disposition: A | Discharge status: Home / Self Care | Source: Ambulatory Visit | Attending: Adult Health | Admitting: Adult Health

## 2023-11-13 DIAGNOSIS — E041 Nontoxic single thyroid nodule: Secondary | ICD-10-CM | POA: Diagnosis not present

## 2023-11-13 DIAGNOSIS — R0609 Other forms of dyspnea: Secondary | ICD-10-CM | POA: Insufficient documentation

## 2023-11-13 DIAGNOSIS — R918 Other nonspecific abnormal finding of lung field: Secondary | ICD-10-CM | POA: Diagnosis not present

## 2023-11-13 DIAGNOSIS — R0602 Shortness of breath: Secondary | ICD-10-CM | POA: Diagnosis not present

## 2023-11-20 ENCOUNTER — Ambulatory Visit: Payer: Self-pay | Admitting: Adult Health

## 2023-11-20 NOTE — Progress Notes (Signed)
 Called and spoke with patient, advised her of results/recommendations per Roena Clark NP.  She stated she is feeling tired and has a cough, but is not couging up mucous and does not feel sick.  I let her know I would talk with Tammy, let her k now how she is feeling and call her back if she feels like she needs to be put on something.  She verified understanding.

## 2023-11-29 DIAGNOSIS — N184 Chronic kidney disease, stage 4 (severe): Secondary | ICD-10-CM | POA: Diagnosis not present

## 2023-11-29 DIAGNOSIS — I10 Essential (primary) hypertension: Secondary | ICD-10-CM | POA: Diagnosis not present

## 2023-11-29 DIAGNOSIS — Z299 Encounter for prophylactic measures, unspecified: Secondary | ICD-10-CM | POA: Diagnosis not present

## 2023-11-29 DIAGNOSIS — J4540 Moderate persistent asthma, uncomplicated: Secondary | ICD-10-CM | POA: Diagnosis not present

## 2023-11-29 DIAGNOSIS — E1122 Type 2 diabetes mellitus with diabetic chronic kidney disease: Secondary | ICD-10-CM | POA: Diagnosis not present

## 2023-11-30 NOTE — Progress Notes (Signed)
 I spoke with Lauren Hamilton on 6/9 after speaking with the patient.  She stated she did not need anything sent in at this time.  Nothing further needed.

## 2023-12-09 DIAGNOSIS — G4733 Obstructive sleep apnea (adult) (pediatric): Secondary | ICD-10-CM | POA: Diagnosis not present

## 2023-12-14 DIAGNOSIS — D631 Anemia in chronic kidney disease: Secondary | ICD-10-CM | POA: Diagnosis not present

## 2023-12-14 DIAGNOSIS — N189 Chronic kidney disease, unspecified: Secondary | ICD-10-CM | POA: Diagnosis not present

## 2023-12-14 DIAGNOSIS — E211 Secondary hyperparathyroidism, not elsewhere classified: Secondary | ICD-10-CM | POA: Diagnosis not present

## 2023-12-14 DIAGNOSIS — R809 Proteinuria, unspecified: Secondary | ICD-10-CM | POA: Diagnosis not present

## 2023-12-21 DIAGNOSIS — N1832 Chronic kidney disease, stage 3b: Secondary | ICD-10-CM | POA: Diagnosis not present

## 2023-12-21 DIAGNOSIS — I129 Hypertensive chronic kidney disease with stage 1 through stage 4 chronic kidney disease, or unspecified chronic kidney disease: Secondary | ICD-10-CM | POA: Diagnosis not present

## 2023-12-21 DIAGNOSIS — N2581 Secondary hyperparathyroidism of renal origin: Secondary | ICD-10-CM | POA: Diagnosis not present

## 2023-12-21 DIAGNOSIS — E876 Hypokalemia: Secondary | ICD-10-CM | POA: Diagnosis not present

## 2024-01-08 ENCOUNTER — Ambulatory Visit: Admitting: Nurse Practitioner

## 2024-01-09 ENCOUNTER — Ambulatory Visit: Attending: Nurse Practitioner | Admitting: Nurse Practitioner

## 2024-01-09 ENCOUNTER — Encounter: Payer: Self-pay | Admitting: Nurse Practitioner

## 2024-01-09 VITALS — BP 116/70 | HR 74 | Ht 67.0 in | Wt 232.0 lb

## 2024-01-09 DIAGNOSIS — R0609 Other forms of dyspnea: Secondary | ICD-10-CM | POA: Diagnosis not present

## 2024-01-09 DIAGNOSIS — N1832 Chronic kidney disease, stage 3b: Secondary | ICD-10-CM | POA: Diagnosis not present

## 2024-01-09 DIAGNOSIS — J45909 Unspecified asthma, uncomplicated: Secondary | ICD-10-CM | POA: Diagnosis not present

## 2024-01-09 DIAGNOSIS — R600 Localized edema: Secondary | ICD-10-CM | POA: Diagnosis not present

## 2024-01-09 DIAGNOSIS — R079 Chest pain, unspecified: Secondary | ICD-10-CM | POA: Diagnosis not present

## 2024-01-09 DIAGNOSIS — Z86711 Personal history of pulmonary embolism: Secondary | ICD-10-CM

## 2024-01-09 DIAGNOSIS — R635 Abnormal weight gain: Secondary | ICD-10-CM | POA: Diagnosis not present

## 2024-01-09 MED ORDER — ISOSORBIDE MONONITRATE ER 120 MG PO TB24: 120.0000 mg | ORAL_TABLET | Freq: Every day | ORAL | 1 refills | Status: AC

## 2024-01-09 NOTE — Progress Notes (Unsigned)
 Cardiology Office Note:  .   Date: 01/09/2024 ID:  OMAH Hamilton, DOB May 20, 1946, MRN [id number] PCP: Lauren Isles, MD  [facility] Providers Cardiologist:  Lauren Carrier, MD    History of Present Illness: .   Lauren Hamilton is a 78 y.o. female with a PMH of chest pain, hypertension, type 2 diabetes, history of bilateral PE, hypercholesterolemia, leg edema, and CKD, who presents today for 10-month follow-up. Followed by Lauren Hamilton and Lauren Hamilton.   Last seen by Dr. Carrier Lauren on April 28, 2023.  She added noticed progressing dyspnea on exertion and chest pains over the past month and a half at the time.  Her chest pain was noted to be somewhat atypical but was noted to have an exertional component.  Lexiscan  suggested areas of peri-infarct ischemia, low to intermediate risk.  Imdur  15 mg daily was added to medication regimen, was recommended to titrate Imdur  over time.  Due to her CKD D with GFR in the 30s, was noted to have very high threshold to consider cardiac catheterization.  08/01/2023 - Today she presents for 13-month follow-up.  She states her shortness of breath remains the same, not able to be as active at [employer] as she previously was. Continues to go to exercise around 3 times per week. CC is her shortness of breath with exertion. CP is better per her report. Now located more to left lateral anterior side of chest, denies any radiation or alleviating/aggravating factors. Described as a twinge, typically noted 2-3 times per day, not associated with exertion. 2-3 on 0-10 pain scale. Denies any palpitations, syncope, presyncope, dizziness, orthopnea, PND, swelling or significant weight changes, acute bleeding, or claudication.  11/03/2023 - Here for follow-up. Does admit to shortness of breath, says her pulmonology provider believes she has asthma and will be undergoing a CT scan of her chest soon.  Does admit to occasional twinges of chest pain.  Described as like  a mild pulled muscle and does not occur with activity and is on the left lateral side of her chest.  She says it is frustrating she is unable to do what she wants.  During exertion, she says her arms feel heavy.  This episode occurred day before yesterday and also all day yesterday.  Describes as mild sensation. Denies any palpitations, syncope, presyncope, dizziness, orthopnea, PND, swelling or significant weight changes, acute bleeding, or claudication.  01/09/2024 -she is here for follow-up.  Tells me medication change since last office visit has not made a difference with her symptoms despite self increasing her Imdur  to 90 mg daily from 60 mg daily.  She still has some chest pain that is left-sided, sometimes noticed during exertion such as folding laundry and sometimes also at rest, still notices some shortness of breath, denies any specific pattern or triggers.  Tells me she is retaining more fluid, weight is up about 12 pounds she says and notices some leg edema.  She also reports some sensation of hot flashes. Denies any palpitations, syncope, presyncope, dizziness, orthopnea, PND, acute bleeding, or claudication.   ROS: Negative. See HPI.   Studies Reviewed: SABRA    EKG: EKG is not ordered today.      Lexiscan  04/2023:    Findings are consistent with prior inferior/inferolatera/inferoapical infarction with mild to moderate peri-infarct ischemia. Low to intermediate risk study   No ST deviation was noted.   LV perfusion is abnormal. Moderate size mild to moderate intensity inferior/inferolateral/inferoapical defect with mild to moderate  reversibility.   Left ventricular function is normal. Nuclear stress EF: 73%. The left ventricular ejection fraction is hyperdynamic (>65%). End diastolic cavity size is normal.  Echo 03/2023:  1. Left ventricular ejection fraction, by estimation, is 60 to 65%. The  left ventricle has normal function. The left ventricle has no regional  wall motion  abnormalities. There is mild left ventricular hypertrophy.  Left ventricular diastolic parameters  are consistent with Grade I diastolic dysfunction (impaired relaxation).   2. Right ventricular systolic function is normal. The right ventricular  size is normal. There is normal pulmonary artery systolic pressure. The  estimated right ventricular systolic pressure is 26.6 mmHg.   3. Left atrial size was mildly dilated.   4. The mitral valve is degenerative. Trivial mitral valve regurgitation.  No evidence of mitral stenosis.   5. The aortic valve is tricuspid. There is mild calcification of the  aortic valve. Aortic valve regurgitation is not visualized. Aortic valve  sclerosis/calcification is present, without any evidence of aortic  stenosis.   6. The inferior vena cava is normal in size with greater than 50%  respiratory variability, suggesting right atrial pressure of 3 mmHg.   Physical Exam:   VS:  BP 116/70   Pulse 74   Ht 5' 7 (1.702 m)   Wt 232 lb (105.2 kg)   SpO2 96%   BMI 36.34 kg/m    Wt Readings from Last 3 Encounters:  01/09/24 232 lb (105.2 kg)  11/03/23 220 lb (99.8 kg)  10/17/23 225 lb 3.2 oz (102.2 kg)    GEN: Obese, 78 y.o. female in no acute distress NECK: No JVD; No carotid bruits CARDIAC: S1/S2, RRR, no murmurs, rubs, gallops RESPIRATORY:  Clear to auscultation without rales, wheezing or rhonchi  ABDOMEN: Soft, non-tender, non-distended EXTREMITIES:  Nonpitting edema to BLE; No deformity   ASSESSMENT AND PLAN: .    Chest pain of uncertain etiology Etiology unclear.  Denies any relief in symptoms compared to last office visit.   Will increase Imdur  to 120 mg daily.   See NST from last year noted above. Agree with Dr. Alvan that would have very high threshold to consider cardiac cath d/t hx of CKD. No other medication changes at this time. Care and ED precautions discussed.   Asthma, DOE Continues to note stable, chronic DOE.  Most recent echo revealed  normal LVEF, no worrisome findings.  Has recent been diagnosed with asthma by her pulmonology NP.   No other medication changes besides what is noted above. Care and ED precautions discussed. Will obtain CBC in addition to labs as mentioned below.  Hx of PE Denies any concerning signs of symptoms. Continue Eliquis  5 mg BID. Continue to follow with PCP.   CKD stage 3b Most recent sCr was stable. Avoid nephrotoxic agents. No medication changes besides what is noted above. Continue to follow with PCP.   5.  Peripheral edema and weight gain Does show nonpitting edema on exam to lower extremities as well as weight is up 12 pounds since last office visit.  She tells me she is taking 120 mg of Lasix  in the morning and to 20 mg of Lasix  in the afternoon for a total of 60 mg of Lasix  daily.  I instructed her that we will take Lasix  40 mg twice daily for the next 3 days, then will reduce back down to normal dosing.  In 1 to 2 weeks, will obtain BMET, magnesium , and proBNP.   Dispo: Follow-up with me/APP  in 4-6 weeks or sooner if anything changes.   Signed, Almarie Crate, NP

## 2024-01-09 NOTE — Patient Instructions (Addendum)
 Medication Instructions:  Your physician has recommended you make the following change in your medication:  Please Increase IMDUR  to 120 Mg daily  Please increase Lasix  to 40 Mg twice daily for the next 3 days then reduce to normal   Labwork: In 1 week at [facility]   Testing/Procedures: None   Follow-Up: Your physician recommends that you schedule a follow-up appointment in: 4-6 weeks   Any Other Special Instructions Will Be Listed Below (If Applicable).  If you need a refill on your cardiac medications before your next appointment, please call your pharmacy.

## 2024-01-11 ENCOUNTER — Encounter: Payer: Self-pay | Admitting: Adult Health

## 2024-01-11 ENCOUNTER — Ambulatory Visit: Admitting: Adult Health

## 2024-01-11 VITALS — BP 121/78 | HR 87 | Temp 97.5°F | Ht 67.0 in | Wt 230.6 lb

## 2024-01-11 DIAGNOSIS — J4530 Mild persistent asthma, uncomplicated: Secondary | ICD-10-CM | POA: Diagnosis not present

## 2024-01-11 DIAGNOSIS — I2602 Saddle embolus of pulmonary artery with acute cor pulmonale: Secondary | ICD-10-CM | POA: Diagnosis not present

## 2024-01-11 DIAGNOSIS — G4733 Obstructive sleep apnea (adult) (pediatric): Secondary | ICD-10-CM

## 2024-01-11 DIAGNOSIS — I2782 Chronic pulmonary embolism: Secondary | ICD-10-CM | POA: Diagnosis not present

## 2024-01-11 NOTE — Progress Notes (Signed)
 @Patient  ID: Lauren Hamilton, female    DOB: 1946-04-24, 78 y.o.   MRN: [id number]  Chief Complaint  Patient presents with   Medical Management of Chronic Issues    ROV     Referring provider: Maree Isles, MD  HPI: 78 yo female never smoker seen for consult 05/2023 for dyspnea felt to have component of Asthma  Medical history significant for recurrent pulmonary embolism on lifelong anticoagulation therapy, obstructive sleep apnea on CPAP , CKD , D CHF, CAD   TEST/EVENTS :  CT chest May 2019 scattered ground glass densities throughout both lungs questionable atelectasis, positive PE echo 03/30/23 ok  - Lexiscan  04/18/23    Findings are consistent with prior inferior/inferolatera/inferoapical infarction with mild to moderate peri-infarct ischemia. Low to intermediate risk study   No ST deviation was noted.   LV perfusion is abnormal. Moderate size mild to moderate intensity inferior/inferolateral/inferoapical defect with mild to moderate reversibility.   Left ventricular function is normal. Nuclear stress EF: 73%. The left ventricular ejection fraction is hyperdynamic (>65%). End diastolic cavity size is normal. 07/24/2022 on RA  patient completed 300 ft  at a moderate pace, slowed down for another 126ft  d/t twinge in her chest but no sob/ lowest sats 92%  Chest x-ray May 24, 2023 coarsened interstitial markings. Echo 03/2023 EF nml, Gr 1 DD , Nml PAP   Pulmonary function testing done August 29, 2023 showed mild to moderate restriction and airflow obstruction with complete reversibility consistent with asthma.  FEV1 69%, ratio 72, FVC 72%, post bronchodilator response with a 32% change, FEV1 postbronchodilator 92%, ratio 79, FVC 87%, DLCO 65%   01/11/2024 Follow up : Asthma , D CHF  Discussed the use of AI scribe software for clinical note transcription with the patient, who gave verbal consent to proceed.  History of Present Illness Lauren Hamilton is a 78 year old female  with asthma who presents for a pulmonary checkup and review of her CT scan.  She has experienced shortness of breath for over a year, which has decreased her activity tolerance. She was started on Breo after a PFT showed restriction with significant BD response/Reversibility. She is unsure if Ranell has helped but continues to use it. She engages in [employer] exercise three times a week but notes increased fatigue and has transitioned to seated exercises due to her symptoms.  A HRCT scan performed on June 2nd showed mild subpleural reticulation and ground glass changes bibasilar. She does not recall any specific severe respiratory infections but has a history of sinus problems. No acute symptoms such as cough, congestion, fever, or discolored mucus, except for a cough when lying down.  She uses her CPAP machine regularly is managed by a provider in [place]. She reports no major issues with its use.  She has a history of thyroid  issues, having had part of her thyroid  removed years ago. A recent scan showed a small nodule on the right thyroid .we discussed needed follow up for evaluation of nodule   Seen by cardiology this week, Imdur  was increased to 60mg  and Lasix  40mg  Twice daily  for 3 days .     Allergies  Allergen Reactions   Linagliptin  Shortness Of Breath and Cough   Latex Rash    Reaction is only with internal contact    Clonidine Derivatives     Dizzy, dry mouth   Etodolac     Unknown reaction   Hydralazine      Pt unsure of reaction  Hydrocodone Other (See Comments)    Altered mental status-Hallucination, tolerated in low doses    Naproxen Other (See Comments)    Damage to kidney    Statins Cough     tolerates lovastatin    Tramadol     Patient, Felt funny, didn't feel right, and felt weird.   Amlodipine Cough   Lisinopril  Cough   Losartan Cough     There is no immunization history on file for this patient.  Past Medical History:  Diagnosis Date   Anemia     Arthritis    Bilateral pulmonary embolism (HCC) 10/2017   H/O unprovoked PE in 2017/2018 treated with anticoagulation for one year and 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 off anticoagulation   Bronchitis    Cancer (HCC)    Skin   Diabetes mellitus without complication (HCC)    Gout    H/O colonoscopy with polypectomy    Hypercholesteremia    Hypertension    Renal disorder    Renal insufficiency     Tobacco History: Social History   Tobacco Use  Smoking Status Never   Passive exposure: Past  Smokeless Tobacco Never   Counseling given: Not Answered   Outpatient Medications Prior to Visit  Medication Sig Dispense Refill   allopurinol  (ZYLOPRIM ) 300 MG tablet Take 300 mg by mouth daily.     apixaban  (ELIQUIS ) 5 MG TABS tablet Take 1 tablet (5 mg total) by mouth 2 (two) times daily. 60 tablet 1   Artificial Tear Solution (SOOTHE XP) SOLN Apply 1-2 drops to eye as needed (dry eyes).      Ascorbic Acid (VITAMIN C PO) Take 1 tablet by mouth daily.     budesonide  (PULMICORT ) 0.5 MG/2ML nebulizer solution ADD (1 VIAL) INTO SALINE IRRIGATION BOTTLE AND IRRIGATE BOTH NOSTRILS MORNING AND NIGHT     Capsicum, Cayenne, (CAYENNE PEPPER PO) Take by mouth.     carvedilol  (COREG ) 6.25 MG tablet Take 1 tablet (6.25 mg total) by mouth 2 (two) times daily with a meal. 60 tablet 1   clobetasol (TEMOVATE) 0.05 % external solution Apply 1 Application topically.     colchicine 0.6 MG tablet Take 0.6 mg by mouth 2 (two) times daily.     famotidine (PEPCID) 40 MG tablet Take 40 mg by mouth at bedtime.     fluticasone  furoate-vilanterol (BREO ELLIPTA ) 100-25 MCG/ACT AEPB Inhale 1 puff into the lungs daily. 30 each 5   furosemide  (LASIX ) 20 MG tablet Take 40 mg by mouth 2 (two) times daily. (Patient taking differently: Take 40 mg by mouth 2 (two) times daily. 20 Mg in the morning and 40 Mg in the evening)     isosorbide  mononitrate (IMDUR ) 120 MG 24 hr tablet Take 1 tablet (120 mg total)  by mouth daily. 90 tablet 1   ketoconazole (NIZORAL) 2 % shampoo Apply 1 Application topically 2 (two) times a week.     levocetirizine (XYZAL) 5 MG tablet Take 2.5 mg by mouth every evening.     linagliptin  (TRADJENTA ) 5 MG TABS tablet Take 1 tablet (5 mg total) by mouth daily. 30 tablet 1   MAGNESIUM  PO Take 1 tablet by mouth daily.     montelukast (SINGULAIR) 10 MG tablet Take 10 mg by mouth at bedtime.     omeprazole (PRILOSEC) 40 MG capsule Take 40 mg by mouth daily.     rizatriptan (MAXALT) 10 MG tablet Take 10 mg by mouth daily as needed.     rosuvastatin  (  CRESTOR ) 10 MG tablet Take 1 tablet (10 mg total) by mouth every evening. 30 tablet 1   spironolactone  (ALDACTONE ) 25 MG tablet Take 25 mg by mouth daily. Tuesday, Thursday, Saturday and Sunday     SUMAtriptan (IMITREX) 100 MG tablet Take 100 mg by mouth every 2 (two) hours as needed for migraine.     vitamin C (ASCORBIC ACID) 250 MG tablet Take 250 mg by mouth daily.     vitamin E 200 UNIT capsule Take 200 Units by mouth daily.     No facility-administered medications prior to visit.     Review of Systems:   Constitutional:   No  weight loss, night sweats,  Fevers, chills, +fatigue, or  lassitude.  HEENT:   No headaches,  Difficulty swallowing,  Tooth/dental problems, or  Sore throat,                No sneezing, itching, ear ache, nasal congestion, post nasal drip,   CV:  No chest pain,  Orthopnea, PND, swelling in lower extremities, anasarca, dizziness, palpitations, syncope.   GI  No heartburn, indigestion, abdominal pain, nausea, vomiting, diarrhea, change in bowel habits, loss of appetite, bloody stools.   Resp: .  No wheezing.  No chest wall deformity  Skin: no rash or lesions.  GU: no dysuria, change in color of urine, no urgency or frequency.  No flank pain, no hematuria   MS:  No joint pain or swelling.  No decreased range of motion.  No back pain.    Physical Exam  BP 121/78   Pulse 87   Temp (!) 97.5 F  (36.4 C) (Temporal)   Ht 5' 7 (1.702 m)   Wt 230 lb 9.6 oz (104.6 kg)   SpO2 98%   BMI 36.12 kg/m   GEN: A/Ox3; pleasant , NAD, well nourished    HEENT:  [place]/AT,  NOSE-clear, THROAT-clear, no lesions, no postnasal drip or exudate noted.   NECK:  Supple w/ fair ROM; no JVD; normal carotid impulses w/o bruits; no thyromegaly or nodules palpated; no lymphadenopathy.    RESP  Clear  P & A; w/o, wheezes/ rales/ or rhonchi. no accessory muscle use, no dullness to percussion  CARD:  RRR, no m/r/g, 1+ peripheral edema, pulses intact, no cyanosis or clubbing.  GI:   Soft & nt; nml bowel sounds; no organomegaly or masses detected.   Musco: Warm bil, no deformities or joint swelling noted.   Neuro: alert, no focal deficits noted.    Skin: Warm, no lesions or rashes    Lab Results:  CBC    Component Value Date/Time   WBC 9.4 11/03/2023 1339   RBC 4.17 11/03/2023 1339   HGB 12.5 11/03/2023 1339   HGB 12.5 05/24/2023 1505   HCT 38.9 11/03/2023 1339   HCT 38.0 05/24/2023 1505   PLT 227 11/03/2023 1339   PLT 203 05/24/2023 1505   MCV 93.3 11/03/2023 1339   MCV 91 05/24/2023 1505   MCH 30.0 11/03/2023 1339   MCHC 32.1 11/03/2023 1339   RDW 15.6 (H) 11/03/2023 1339   RDW 14.8 05/24/2023 1505   LYMPHSABS 2.7 05/24/2023 1505   MONOABS 0.5 10/31/2019 1250   EOSABS 197 11/03/2023 1339   EOSABS 0.3 05/24/2023 1505   BASOSABS 38 11/03/2023 1339   BASOSABS 0.0 05/24/2023 1505    BMET    Component Value Date/Time   NA 140 11/03/2023 1339   NA 144 05/24/2023 1505   K 4.3 11/03/2023 1339  CL 100 11/03/2023 1339   CO2 30 11/03/2023 1339   GLUCOSE 92 11/03/2023 1339   BUN 54 (H) 11/03/2023 1339   BUN 41 (H) 05/24/2023 1505   CREATININE 1.68 (H) 11/03/2023 1339   CALCIUM  10.3 11/03/2023 1339   GFRNONAA 14 (L) 06/21/2021 0537   GFRNONAA 42 (L) 03/11/2020 0930   GFRAA 48 (L) 03/11/2020 0930    BNP    Component Value Date/Time   BNP 17 11/03/2023 1339    ProBNP No  results found for: PROBNP  Imaging: No results found.  Administration History     None          Latest Ref Rng & Units 08/29/2023    8:16 AM  PFT Results  FVC-Pre L 2.25   FVC-Predicted Pre % 72   FVC-Post L 2.71   FVC-Predicted Post % 87   Pre FEV1/FVC % % 72   Post FEV1/FCV % % 79   FEV1-Pre L 1.63   FEV1-Predicted Pre % 69   FEV1-Post L 2.15   DLCO uncorrected ml/min/mmHg 13.63   DLCO UNC% % 65   DLVA Predicted % 81   TLC L 4.79   TLC % Predicted % 86   RV % Predicted % 74     No results found for: NITRICOXIDE      Assessment & Plan:   No problem-specific Assessment & Plan notes found for this encounter. Assessment and Plan Assessment & Plan Asthma- PFT showed significant reversibility on PFT  .Continue Breo and ensure proper rinsing after use. Consider adding an albuterol  inhaler if needed going forward.   Abnormal CT chest - HRCT showed mild BB GGO/subpleural reticulation. . There are no acute symptoms. The scarring is mild  Repeat the CT scan in one year to monitor scarring. May need to further evaluation for possible fibrosis.    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 The diuretic was increased for three days per cardiologist's advice, Imdur  increased for possible anginal component. No cardiac cath at this time due to Concerns about renal function.  Continue current heart failure medications and follow up with cardiology as needed.  DOE- suspect is multifactoral with DCHF, CAD, Asthma ,Mild scarring on CT chest/?possible early fibrosis ,  Physical deconditioning, obesity. Continue on asthma regimen . Follow cardiology recommendations  Thyroid  nodule - follow up with PCP .   Osa -continue on CPAP .     Madelin Stank, NP 01/11/2024

## 2024-01-11 NOTE — Patient Instructions (Addendum)
 Follow up with Primary MD regarding your thyroid  nodules. Continue on Breo 1 puff daily, rinse after use.  Activity as tolerated.  Keep follow up with Cardiology  Continue on CPAP At bedtime  -follow up at [facility] as planned.  Follow up in 3 months and As needed   Please contact office for sooner follow up if symptoms do not improve or worsen or seek emergency care

## 2024-01-15 DIAGNOSIS — N1832 Chronic kidney disease, stage 3b: Secondary | ICD-10-CM | POA: Diagnosis not present

## 2024-01-15 DIAGNOSIS — R079 Chest pain, unspecified: Secondary | ICD-10-CM | POA: Diagnosis not present

## 2024-01-16 LAB — CBC
HCT: 37.9 % (ref 35.0–45.0)
Hemoglobin: 11.9 g/dL (ref 11.7–15.5)
MCH: 30.2 pg (ref 27.0–33.0)
MCHC: 31.4 g/dL — ABNORMAL LOW (ref 32.0–36.0)
MCV: 96.2 fL (ref 80.0–100.0)
MPV: 10.8 fL (ref 7.5–12.5)
Platelets: 182 Thousand/uL (ref 140–400)
RBC: 3.94 Million/uL (ref 3.80–5.10)
RDW: 14.5 % (ref 11.0–15.0)
WBC: 8 Thousand/uL (ref 3.8–10.8)

## 2024-01-16 LAB — BASIC METABOLIC PANEL WITH GFR
BUN/Creatinine Ratio: 25 (calc) — ABNORMAL HIGH (ref 6–22)
BUN: 37 mg/dL — ABNORMAL HIGH (ref 7–25)
CO2: 33 mmol/L — ABNORMAL HIGH (ref 20–32)
Calcium: 10.1 mg/dL (ref 8.6–10.4)
Chloride: 99 mmol/L (ref 98–110)
Creat: 1.5 mg/dL — ABNORMAL HIGH (ref 0.60–1.00)
Glucose, Bld: 107 mg/dL — ABNORMAL HIGH (ref 65–99)
Potassium: 4.1 mmol/L (ref 3.5–5.3)
Sodium: 141 mmol/L (ref 135–146)
eGFR: 36 mL/min/1.73m2 — ABNORMAL LOW (ref >=60)

## 2024-01-16 LAB — MAGNESIUM: Magnesium: 2.1 mg/dL (ref 1.5–2.5)

## 2024-01-16 LAB — BRAIN NATRIURETIC PEPTIDE: Brain Natriuretic Peptide: 19 pg/mL (ref <100)

## 2024-01-30 ENCOUNTER — Telehealth: Payer: Self-pay | Admitting: Nurse Practitioner

## 2024-01-30 NOTE — Telephone Encounter (Signed)
 Patient stated she is returning a call she received from us , no message was left.

## 2024-01-30 NOTE — Telephone Encounter (Signed)
 Apologized to patient but no one called from this office.

## 2024-02-04 ENCOUNTER — Ambulatory Visit: Payer: Self-pay | Admitting: Nurse Practitioner

## 2024-02-15 DIAGNOSIS — M79674 Pain in right toe(s): Secondary | ICD-10-CM | POA: Diagnosis not present

## 2024-02-15 DIAGNOSIS — E1142 Type 2 diabetes mellitus with diabetic polyneuropathy: Secondary | ICD-10-CM | POA: Diagnosis not present

## 2024-02-15 DIAGNOSIS — B351 Tinea unguium: Secondary | ICD-10-CM | POA: Diagnosis not present

## 2024-02-15 DIAGNOSIS — M79675 Pain in left toe(s): Secondary | ICD-10-CM | POA: Diagnosis not present

## 2024-02-15 DIAGNOSIS — L84 Corns and callosities: Secondary | ICD-10-CM | POA: Diagnosis not present

## 2024-02-29 ENCOUNTER — Ambulatory Visit: Attending: Nurse Practitioner | Admitting: Nurse Practitioner

## 2024-02-29 ENCOUNTER — Encounter: Payer: Self-pay | Admitting: Nurse Practitioner

## 2024-02-29 VITALS — BP 132/78 | HR 77 | Ht 67.0 in | Wt 225.4 lb

## 2024-02-29 DIAGNOSIS — N1832 Chronic kidney disease, stage 3b: Secondary | ICD-10-CM

## 2024-02-29 DIAGNOSIS — Z86711 Personal history of pulmonary embolism: Secondary | ICD-10-CM | POA: Diagnosis not present

## 2024-02-29 DIAGNOSIS — J45909 Unspecified asthma, uncomplicated: Secondary | ICD-10-CM

## 2024-02-29 DIAGNOSIS — R252 Cramp and spasm: Secondary | ICD-10-CM | POA: Diagnosis not present

## 2024-02-29 DIAGNOSIS — R0609 Other forms of dyspnea: Secondary | ICD-10-CM | POA: Diagnosis not present

## 2024-02-29 DIAGNOSIS — R079 Chest pain, unspecified: Secondary | ICD-10-CM | POA: Diagnosis not present

## 2024-02-29 MED ORDER — POTASSIUM CHLORIDE CRYS ER 20 MEQ PO TBCR: 20.0000 meq | EXTENDED_RELEASE_TABLET | Freq: Two times a day (BID) | ORAL | 3 refills | Status: DC

## 2024-02-29 MED ORDER — METOPROLOL SUCCINATE ER 25 MG PO TB24: 25.0000 mg | ORAL_TABLET | Freq: Every day | ORAL | 1 refills | Status: DC

## 2024-02-29 NOTE — H&P (View-Only) (Signed)
 Cardiology Office Note:  .   Date: 02/29/2024 ID:  WILLY VORCE, DOB 1945/08/05, MRN [id number] PCP: Maree Isles, MD  [facility] Providers Cardiologist:  Alvan Carrier, MD    History of Present Illness: .   Lauren Hamilton is a 78 y.o. female with a PMH of chest pain, hypertension, type 2 diabetes, history of bilateral PE, hypercholesterolemia, leg edema, and CKD, who presents today for 68-month follow-up. Followed by Dr. Darlean and Dr. Rachele.   Last seen by Dr. Carrier Alvan on April 28, 2023.  She added noticed progressing dyspnea on exertion and chest pains over the past month and a half at the time.  Her chest pain was noted to be somewhat atypical but was noted to have an exertional component.  Lexiscan  suggested areas of peri-infarct ischemia, low to intermediate risk.  Imdur  15 mg daily was added to medication regimen, was recommended to titrate Imdur  over time.  Due to her CKD D with GFR in the 30s, was noted to have very high threshold to consider cardiac catheterization.  08/01/2023 - Today she presents for 7-month follow-up.  She states her shortness of breath remains the same, not able to be as active at [employer] as she previously was. Continues to go to exercise around 3 times per week. CC is her shortness of breath with exertion. CP is better per her report. Now located more to left lateral anterior side of chest, denies any radiation or alleviating/aggravating factors. Described as a twinge, typically noted 2-3 times per day, not associated with exertion. 2-3 on 0-10 pain scale. Denies any palpitations, syncope, presyncope, dizziness, orthopnea, PND, swelling or significant weight changes, acute bleeding, or claudication.  11/03/2023 - Here for follow-up. Does admit to shortness of breath, says her pulmonology provider believes she has asthma and will be undergoing a CT scan of her chest soon.  Does admit to occasional twinges of chest pain.  Described as like  a mild pulled muscle and does not occur with activity and is on the left lateral side of her chest.  She says it is frustrating she is unable to do what she wants.  During exertion, she says her arms feel heavy.  This episode occurred day before yesterday and also all day yesterday.  Describes as mild sensation. Denies any palpitations, syncope, presyncope, dizziness, orthopnea, PND, swelling or significant weight changes, acute bleeding, or claudication.  01/09/2024 -she is here for follow-up.  Tells me medication change since last office visit has not made a difference with her symptoms despite self increasing her Imdur  to 90 mg daily from 60 mg daily.  She still has some chest pain that is left-sided, sometimes noticed during exertion such as folding laundry and sometimes also at rest, still notices some shortness of breath, denies any specific pattern or triggers.  Tells me she is retaining more fluid, weight is up about 12 pounds she says and notices some leg edema.  She also reports some sensation of hot flashes. Denies any palpitations, syncope, presyncope, dizziness, orthopnea, PND, acute bleeding, or claudication.  02/29/2024 -  Here for follow-up. Continues to admit to DOE and sensation of what feels like left upper chest/shoulder pain despite increasing Imdur  (can't tell a difference since increasing medication), episodes are taking longer to subside. Denies any palpitations, syncope, presyncope, dizziness, orthopnea, PND, swelling or significant weight changes, acute bleeding, or claudication. Follows pulmonology for diagnosis of Asthma.    ROS: Negative. See HPI.   Studies Reviewed: .  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 Low to intermediate risk study   No ST deviation was noted.   LV perfusion is abnormal. Moderate size mild to moderate intensity inferior/inferolateral/inferoapical defect with mild to moderate reversibility.   Left ventricular function is normal. Nuclear stress EF: 73%. The left ventricular ejection fraction is hyperdynamic (>65%). End diastolic cavity size is normal.  Echo 03/2023:  1. Left ventricular ejection fraction, by estimation, is 60 to 65%. The  left ventricle has normal function. The left ventricle has no regional  wall motion abnormalities. There is mild left ventricular hypertrophy.  Left ventricular diastolic parameters  are consistent with Grade I diastolic dysfunction (impaired relaxation).   2. Right ventricular systolic function is normal. The right ventricular  size is normal. There is normal pulmonary artery systolic pressure. The  estimated right ventricular systolic pressure is 26.6 mmHg.   3. Left atrial size was mildly dilated.   4. The mitral valve is degenerative. Trivial mitral valve regurgitation.  No evidence of mitral stenosis.   5. The aortic valve is tricuspid. There is mild calcification of the  aortic valve. Aortic valve regurgitation is not visualized. Aortic valve  sclerosis/calcification is present, without any evidence of aortic  stenosis.   6. The inferior vena cava is normal in size with greater than 50%  respiratory variability, suggesting right atrial pressure of 3 mmHg.   Physical Exam:   VS:  BP 132/78 (BP Location: Left Arm)   Pulse 77   Ht 5' 7 (1.702 m)   Wt  225 lb 6.4 oz (102.2 kg)   SpO2 95%   BMI 35.30 kg/m    Wt Readings from Last 3 Encounters:  02/29/24 225 lb 6.4 oz (102.2 kg)  01/11/24 230 lb 9.6 oz (104.6 kg)  01/09/24 232 lb (105.2 kg)    GEN: Obese, 78 y.o. female in no acute distress NECK: No JVD; No carotid bruits CARDIAC: S1/S2, RRR, no murmurs, rubs, gallops RESPIRATORY:  Clear to auscultation without rales, wheezing or rhonchi  ABDOMEN: Soft, non-tender, non-distended EXTREMITIES:  No edema to BLE; No deformity   ASSESSMENT AND PLAN: .    Chest pain of uncertain etiology Etiology unclear.  Denies any relief in symptoms compared to last office visit despite increasing Imdur . See NST from last year noted above. Agree with Dr. Alvan that would have very high threshold to consider cardiac cath d/t hx of CKD, due to recent improvement in kidney function, will consult Dr. Alvan and Dr. Rachele regarding a heart cath, specifically right and left heart cath. Discussed with Dr. Alvan via secure chat today and stated if CKD remains stable in stage 3, okay to consider cath. Faxing a letter to Dr. Rachele for approval. No other medication changes at this time. Care and ED precautions discussed.   Addendum: 03/26/2024 - Dr. Rachele is agreeable to cath. Pre-procedure signed and held orders have  been placed. CBC and BMET are completed.   Informed Consent   Shared Decision Making/Informed Consent The risks [stroke (1 in 1000), death (1 in 1000), kidney failure [usually temporary] (1 in 500), bleeding (1 in 200), allergic reaction [possibly serious] (1 in 200)], benefits (diagnostic support and management of coronary artery disease) and alternatives of a cardiac catheterization were discussed in detail with Ms. Thibodaux and she is willing to proceed.     Asthma, DOE Continues to note stable, chronic DOE.  Most recent echo revealed normal LVEF, no worrisome findings.  Has been diagnosed with asthma by her pulmonology NP.   Will stop Coreg  and  begin Toprol  XL 25 mg daily to see if this improves her symptoms. No other medication changes besides what is noted above. Care and ED precautions discussed. Running right and left heart cath past her Nephrology as noted above.   Hx of PE Denies any concerning signs of symptoms. Continue Eliquis  5 mg BID. Continue to follow with PCP.   CKD stage 3b Most recent sCr was improved. Avoid nephrotoxic agents. Continue to follow with PCP and Nephrology and getting BMET and CBC.   5. Leg cramping Associated with taking her diuretic. Will begin potassium chloride  20 mEq BID as she is on Lasix . Will obtain labs as noted above.    Dispo: Follow-up with MD/APP in 4-6 weeks or sooner if anything changes.   Signed, Almarie Crate, NP

## 2024-02-29 NOTE — Progress Notes (Addendum)
 Cardiology Office Note:  .   Date: 02/29/2024 ID:  Lauren Hamilton, DOB 1945/08/05, MRN [id number] PCP: Maree Isles, MD  [facility] Providers Cardiologist:  Alvan Carrier, MD    History of Present Illness: .   Lauren Hamilton is a 78 y.o. female with a PMH of chest pain, hypertension, type 2 diabetes, history of bilateral PE, hypercholesterolemia, leg edema, and CKD, who presents today for 68-month follow-up. Followed by Dr. Darlean and Dr. Rachele.   Last seen by Dr. Carrier Alvan on April 28, 2023.  She added noticed progressing dyspnea on exertion and chest pains over the past month and a half at the time.  Her chest pain was noted to be somewhat atypical but was noted to have an exertional component.  Lexiscan  suggested areas of peri-infarct ischemia, low to intermediate risk.  Imdur  15 mg daily was added to medication regimen, was recommended to titrate Imdur  over time.  Due to her CKD D with GFR in the 30s, was noted to have very high threshold to consider cardiac catheterization.  08/01/2023 - Today she presents for 7-month follow-up.  She states her shortness of breath remains the same, not able to be as active at [employer] as she previously was. Continues to go to exercise around 3 times per week. CC is her shortness of breath with exertion. CP is better per her report. Now located more to left lateral anterior side of chest, denies any radiation or alleviating/aggravating factors. Described as a twinge, typically noted 2-3 times per day, not associated with exertion. 2-3 on 0-10 pain scale. Denies any palpitations, syncope, presyncope, dizziness, orthopnea, PND, swelling or significant weight changes, acute bleeding, or claudication.  11/03/2023 - Here for follow-up. Does admit to shortness of breath, says her pulmonology provider believes she has asthma and will be undergoing a CT scan of her chest soon.  Does admit to occasional twinges of chest pain.  Described as like  a mild pulled muscle and does not occur with activity and is on the left lateral side of her chest.  She says it is frustrating she is unable to do what she wants.  During exertion, she says her arms feel heavy.  This episode occurred day before yesterday and also all day yesterday.  Describes as mild sensation. Denies any palpitations, syncope, presyncope, dizziness, orthopnea, PND, swelling or significant weight changes, acute bleeding, or claudication.  01/09/2024 -she is here for follow-up.  Tells me medication change since last office visit has not made a difference with her symptoms despite self increasing her Imdur  to 90 mg daily from 60 mg daily.  She still has some chest pain that is left-sided, sometimes noticed during exertion such as folding laundry and sometimes also at rest, still notices some shortness of breath, denies any specific pattern or triggers.  Tells me she is retaining more fluid, weight is up about 12 pounds she says and notices some leg edema.  She also reports some sensation of hot flashes. Denies any palpitations, syncope, presyncope, dizziness, orthopnea, PND, acute bleeding, or claudication.  02/29/2024 -  Here for follow-up. Continues to admit to DOE and sensation of what feels like left upper chest/shoulder pain despite increasing Imdur  (can't tell a difference since increasing medication), episodes are taking longer to subside. Denies any palpitations, syncope, presyncope, dizziness, orthopnea, PND, swelling or significant weight changes, acute bleeding, or claudication. Follows pulmonology for diagnosis of Asthma.    ROS: Negative. See HPI.   Studies Reviewed: .  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 Low to intermediate risk study   No ST deviation was noted.   LV perfusion is abnormal. Moderate size mild to moderate intensity inferior/inferolateral/inferoapical defect with mild to moderate reversibility.   Left ventricular function is normal. Nuclear stress EF: 73%. The left ventricular ejection fraction is hyperdynamic (>65%). End diastolic cavity size is normal.  Echo 03/2023:  1. Left ventricular ejection fraction, by estimation, is 60 to 65%. The  left ventricle has normal function. The left ventricle has no regional  wall motion abnormalities. There is mild left ventricular hypertrophy.  Left ventricular diastolic parameters  are consistent with Grade I diastolic dysfunction (impaired relaxation).   2. Right ventricular systolic function is normal. The right ventricular  size is normal. There is normal pulmonary artery systolic pressure. The  estimated right ventricular systolic pressure is 26.6 mmHg.   3. Left atrial size was mildly dilated.   4. The mitral valve is degenerative. Trivial mitral valve regurgitation.  No evidence of mitral stenosis.   5. The aortic valve is tricuspid. There is mild calcification of the  aortic valve. Aortic valve regurgitation is not visualized. Aortic valve  sclerosis/calcification is present, without any evidence of aortic  stenosis.   6. The inferior vena cava is normal in size with greater than 50%  respiratory variability, suggesting right atrial pressure of 3 mmHg.   Physical Exam:   VS:  BP 132/78 (BP Location: Left Arm)   Pulse 77   Ht 5' 7 (1.702 m)   Wt  225 lb 6.4 oz (102.2 kg)   SpO2 95%   BMI 35.30 kg/m    Wt Readings from Last 3 Encounters:  02/29/24 225 lb 6.4 oz (102.2 kg)  01/11/24 230 lb 9.6 oz (104.6 kg)  01/09/24 232 lb (105.2 kg)    GEN: Obese, 78 y.o. female in no acute distress NECK: No JVD; No carotid bruits CARDIAC: S1/S2, RRR, no murmurs, rubs, gallops RESPIRATORY:  Clear to auscultation without rales, wheezing or rhonchi  ABDOMEN: Soft, non-tender, non-distended EXTREMITIES:  No edema to BLE; No deformity   ASSESSMENT AND PLAN: .    Chest pain of uncertain etiology Etiology unclear.  Denies any relief in symptoms compared to last office visit despite increasing Imdur . See NST from last year noted above. Agree with Dr. Alvan that would have very high threshold to consider cardiac cath d/t hx of CKD, due to recent improvement in kidney function, will consult Dr. Alvan and Dr. Rachele regarding a heart cath, specifically right and left heart cath. Discussed with Dr. Alvan via secure chat today and stated if CKD remains stable in stage 3, okay to consider cath. Faxing a letter to Dr. Rachele for approval. No other medication changes at this time. Care and ED precautions discussed.   Addendum: 03/26/2024 - Dr. Rachele is agreeable to cath. Pre-procedure signed and held orders have  been placed. CBC and BMET are completed.   Informed Consent   Shared Decision Making/Informed Consent The risks [stroke (1 in 1000), death (1 in 1000), kidney failure [usually temporary] (1 in 500), bleeding (1 in 200), allergic reaction [possibly serious] (1 in 200)], benefits (diagnostic support and management of coronary artery disease) and alternatives of a cardiac catheterization were discussed in detail with Lauren Hamilton and she is willing to proceed.     Asthma, DOE Continues to note stable, chronic DOE.  Most recent echo revealed normal LVEF, no worrisome findings.  Has been diagnosed with asthma by her pulmonology NP.   Will stop Coreg  and  begin Toprol  XL 25 mg daily to see if this improves her symptoms. No other medication changes besides what is noted above. Care and ED precautions discussed. Running right and left heart cath past her Nephrology as noted above.   Hx of PE Denies any concerning signs of symptoms. Continue Eliquis  5 mg BID. Continue to follow with PCP.   CKD stage 3b Most recent sCr was improved. Avoid nephrotoxic agents. Continue to follow with PCP and Nephrology and getting BMET and CBC.   5. Leg cramping Associated with taking her diuretic. Will begin potassium chloride  20 mEq BID as she is on Lasix . Will obtain labs as noted above.    Dispo: Follow-up with MD/APP in 4-6 weeks or sooner if anything changes.   Signed, Almarie Crate, NP

## 2024-02-29 NOTE — Patient Instructions (Addendum)
 Medication Instructions:  Your physician has recommended you make the following change in your medication:  Please stop Carvedilol   Please start Metoprolol  succinate 25 Mg daily  Please start potassium 20 MeQ twice daily   Labwork: In 1 week with [facility]   Testing/Procedures: none  Follow-Up: Your physician recommends that you schedule a follow-up appointment in: 4-6 weeks   Any Other Special Instructions Will Be Listed Below (If Applicable).  If you need a refill on your cardiac medications before your next appointment, please call your pharmacy.

## 2024-03-04 ENCOUNTER — Telehealth: Payer: Self-pay | Admitting: Nurse Practitioner

## 2024-03-04 NOTE — Telephone Encounter (Signed)
 Pt c/o medication issue:  1. Name of Medication:   metoprolol  succinate (TOPROL -XL) 25 MG 24 hr tablet    2. How are you currently taking this medication (dosage and times per day)? Take 1 tablet (25 mg total) by mouth daily. Take with or immediately following a meal.   3. Are you having a reaction (difficulty breathing--STAT)? Yes  4. What is your medication issue? Pt states that medication is giving her diarrhea which is activating her hemorrhoids.

## 2024-03-04 NOTE — Telephone Encounter (Signed)
 Patient was seen 9/18 and was stared on metoprolol , patient states she started the medication Friday 9/19 and began having diarrhea that day and has continued to have it start after taking metoprolol  in the morning about 1 hour after taking. She states she started her potassium as well but she has taken this before and had no issues. Patient is not having any other symptoms and has not changed anything except potassium and metoprolol . Advised patient I would route to provider for advice.

## 2024-03-04 NOTE — Telephone Encounter (Signed)
 Patient informed and verbalized understanding of plan.

## 2024-03-05 ENCOUNTER — Ambulatory Visit: Admitting: Nurse Practitioner

## 2024-03-05 DIAGNOSIS — N184 Chronic kidney disease, stage 4 (severe): Secondary | ICD-10-CM | POA: Diagnosis not present

## 2024-03-05 DIAGNOSIS — Z299 Encounter for prophylactic measures, unspecified: Secondary | ICD-10-CM | POA: Diagnosis not present

## 2024-03-05 DIAGNOSIS — E041 Nontoxic single thyroid nodule: Secondary | ICD-10-CM | POA: Diagnosis not present

## 2024-03-05 DIAGNOSIS — E119 Type 2 diabetes mellitus without complications: Secondary | ICD-10-CM | POA: Diagnosis not present

## 2024-03-05 DIAGNOSIS — I10 Essential (primary) hypertension: Secondary | ICD-10-CM | POA: Diagnosis not present

## 2024-03-05 DIAGNOSIS — Z2821 Immunization not carried out because of patient refusal: Secondary | ICD-10-CM | POA: Diagnosis not present

## 2024-03-08 DIAGNOSIS — R079 Chest pain, unspecified: Secondary | ICD-10-CM | POA: Diagnosis not present

## 2024-03-08 DIAGNOSIS — N1832 Chronic kidney disease, stage 3b: Secondary | ICD-10-CM | POA: Diagnosis not present

## 2024-03-08 LAB — BASIC METABOLIC PANEL WITH GFR
BUN/Creatinine Ratio: 28 (calc) — ABNORMAL HIGH (ref 6–22)
BUN: 38 mg/dL — ABNORMAL HIGH (ref 7–25)
CO2: 26 mmol/L (ref 20–32)
Calcium: 10.4 mg/dL (ref 8.6–10.4)
Chloride: 103 mmol/L (ref 98–110)
Creat: 1.36 mg/dL — ABNORMAL HIGH (ref 0.60–1.00)
Glucose, Bld: 112 mg/dL (ref 65–139)
Potassium: 4.3 mmol/L (ref 3.5–5.3)
Sodium: 139 mmol/L (ref 135–146)
eGFR: 40 mL/min/1.73m2 — ABNORMAL LOW (ref >=60)

## 2024-03-11 ENCOUNTER — Other Ambulatory Visit: Payer: Self-pay | Admitting: Nurse Practitioner

## 2024-03-11 MED ORDER — BISOPROLOL FUMARATE 2.5 MG PO TABS: 2.5000 mg | ORAL_TABLET | Freq: Every day | ORAL | 3 refills | Status: DC

## 2024-03-11 MED ORDER — POTASSIUM CHLORIDE 20 MEQ/15ML (10%) PO SOLN: 20.0000 meq | Freq: Every day | ORAL | 5 refills | Status: DC

## 2024-03-11 NOTE — Telephone Encounter (Signed)
 Patient called to follow-up as requested regarding stopping medication.  Patient stated she gets migraine headaches and wants to get alternate medication.

## 2024-03-11 NOTE — Telephone Encounter (Signed)
 Spoke to patient who verbalized understanding. RX sent to pt's preferred pharmacy.   Pt is requesting changing Potassium tablets to liquid.   Please advise if this is acceptable.

## 2024-03-11 NOTE — Telephone Encounter (Signed)
 Spoke to patient who stated that she has not restarted Metoprolol . Pt stated that she is requesting a alternate medication to Metoprolol . Pt stated that provider mentioned Toprimate to help with migraines and is requesting this medication.   Please advise.

## 2024-03-12 DIAGNOSIS — G4733 Obstructive sleep apnea (adult) (pediatric): Secondary | ICD-10-CM | POA: Diagnosis not present

## 2024-03-18 ENCOUNTER — Ambulatory Visit: Payer: Self-pay | Admitting: Nurse Practitioner

## 2024-03-19 DIAGNOSIS — Z85828 Personal history of other malignant neoplasm of skin: Secondary | ICD-10-CM | POA: Diagnosis not present

## 2024-03-19 DIAGNOSIS — Z85820 Personal history of malignant melanoma of skin: Secondary | ICD-10-CM | POA: Diagnosis not present

## 2024-03-19 DIAGNOSIS — L218 Other seborrheic dermatitis: Secondary | ICD-10-CM | POA: Diagnosis not present

## 2024-03-20 DIAGNOSIS — E041 Nontoxic single thyroid nodule: Secondary | ICD-10-CM | POA: Diagnosis not present

## 2024-03-21 NOTE — Telephone Encounter (Signed)
 Spoke with Stacyann at Dr. Juanito office. Informed that they did receive the letter from E. Miriam and would be forwarding it to him.

## 2024-03-21 NOTE — Telephone Encounter (Signed)
-----   Message from Almarie Crate sent at 03/08/2024  4:01 PM EDT ----- Please call patient's Nephrology office (Dr. Rachele) and see if he is okay with her proceeding with a cath. Have not heard back from his office.   Thanks!   Best, Almarie Crate, NP

## 2024-03-25 ENCOUNTER — Telehealth: Payer: Self-pay | Admitting: Nurse Practitioner

## 2024-03-25 DIAGNOSIS — Z01812 Encounter for preprocedural laboratory examination: Secondary | ICD-10-CM

## 2024-03-25 NOTE — Telephone Encounter (Signed)
 Patient informed and verbalized understanding of plan. Patient is scheduled for L & R Heart Cath 10/16  with Dr.Ganji at 10:30 Patient to arrive at 5:30 to have IV Hydration done. For Clinical/MD Purposes- Please see letter under media from Dr.Bhutani on 03/25/24 with recomedations regarding Hydration/contrast etc.  Provider informed and staff message sent to front office for pre cert.  CBC Lab sent to [facility] for patient to complete tomorrow morning 10/14  Will Complete CATH instructions for patient tomorrow morning and send to patient VIA Mychart and will call and discuss instructions with patient.

## 2024-03-25 NOTE — Telephone Encounter (Signed)
 Checking percert on the following patient for    Right and Left Heart Cath on 10/16 with Dr.Ganji at 10:30 patient to arrive at 5:30

## 2024-03-25 NOTE — Telephone Encounter (Addendum)
 Left message for patient to return call to discuss R & L Heart Cath   Patient Needs- CBC BMETdone 9/26, EKG Done 9/18

## 2024-03-25 NOTE — Telephone Encounter (Signed)
-----   Message from Almarie Crate sent at 03/25/2024  3:04 PM EDT ----- No problem. I have faxed papers I will give to you that Darryle provided me today from their office. Dr. Rachele has cleared her for cardiac cath. Please arrange a right and a left heart cath and reschedule her next appt with me to be 3-4 weeks after her cath.   She will more than likely need an update on her CBC. She will need to hold her fluid pills and Tradjenta  according to protocol before.   Thanks!   Best, Almarie Crate, NP ----- Message ----- From: Myriam Fine Sent: 03/25/2024   9:26 AM EDT To: Almarie Crate, NP  I am sorry this was not taking care of while I was out.  I just spoke with his office and the nurse is going to talk with him and have something sent back today! I have relayed a message to front office to scan in and notify me right away once received.  Thanks. ----- Message ----- From: Crate Almarie, NP Sent: 03/08/2024   4:01 PM EDT To: Fine Myriam  Please call patient's Nephrology office (Dr. Rachele) and see if he is okay with her proceeding with a cath. Have not heard back from his office.   Thanks!   Best, Almarie Crate, NP

## 2024-03-26 ENCOUNTER — Encounter: Payer: Self-pay | Admitting: Nurse Practitioner

## 2024-03-26 DIAGNOSIS — Z01812 Encounter for preprocedural laboratory examination: Secondary | ICD-10-CM | POA: Diagnosis not present

## 2024-03-26 LAB — CBC
HCT: 38.3 % (ref 35.0–45.0)
Hemoglobin: 12.3 g/dL (ref 11.7–15.5)
MCH: 29.3 pg (ref 27.0–33.0)
MCHC: 32.1 g/dL (ref 32.0–36.0)
MCV: 91.2 fL (ref 80.0–100.0)
MPV: 11.3 fL (ref 7.5–12.5)
Platelets: 175 Thousand/uL (ref 140–400)
RBC: 4.2 Million/uL (ref 3.80–5.10)
RDW: 15.2 % — ABNORMAL HIGH (ref 11.0–15.0)
WBC: 6 Thousand/uL (ref 3.8–10.8)

## 2024-03-26 NOTE — Telephone Encounter (Signed)
 Called patient and reviewed all Cath Instruction with her and she verbalized understanding. I answered any question patient had. Advised patient Front office staff will call her today and move her follow up appointment from next week to 3-4 weeks after cath is done. Patient verbalized understanding and is headed to get labs done now.

## 2024-03-26 NOTE — Addendum Note (Signed)
 Addended by: MIRIAM NORRIS on: 03/26/2024 02:38 PM   Modules accepted: Orders

## 2024-03-26 NOTE — Telephone Encounter (Signed)
 Letter sent to patient with instructions for Cath. Will call patient at 8 AM

## 2024-03-27 ENCOUNTER — Telehealth: Payer: Self-pay | Admitting: *Deleted

## 2024-03-27 NOTE — Telephone Encounter (Addendum)
 Cardiac Catheterization scheduled at [facility] for: Thursday March 28, 2024 10:30 AM Arrival time [facility] Main Entrance A at: 5:30 AM-pre-procedure hydration  Diet: -Nothing to eat after midnight.  Hydration: -May drink clear liquids until 2 hours before the procedure.  Approved liquids: Water , clear tea, black coffee, fruit juices-non-citric and without pulp,Gatorade, plain Jello/popsicles.  No PO hydration (16 oz water ) 2 hours prior to procedure-going in early for IVF  Medication instructions: -Hold per Dr Rachele recommendation:  Lasix /Aldactone /Farxiga-day before and 1 day after the procedure  Eliquis -pt tells me she did not take Eliquis  starting 03/26/24 and knows to hold until post procedure -Other usual morning medications can be taken including aspirin 81 mg.  Plan to go home the same day, you will only stay overnight if medically necessary.  You must have responsible adult to drive you home.  Someone must be with you the first 24 hours after you arrive home.  Reviewed procedure instructions with patient.  Patient tells me she is not currently taking Tradjenta  or KCl supplement.

## 2024-03-27 NOTE — Telephone Encounter (Signed)
 Please see 03/25/24 letter under Media Tab from Dr. Loanne Bash, NP with recomedations for cardiac cath regarding hydration/contrast/medications, etc.

## 2024-03-28 ENCOUNTER — Encounter (HOSPITAL_COMMUNITY): Payer: Self-pay | Admitting: Cardiology

## 2024-03-28 ENCOUNTER — Ambulatory Visit (HOSPITAL_COMMUNITY): Admission: RE | Disposition: A | Payer: Self-pay | Source: Home / Self Care | Attending: Cardiology

## 2024-03-28 ENCOUNTER — Ambulatory Visit: Payer: Self-pay | Admitting: Nurse Practitioner

## 2024-03-28 ENCOUNTER — Other Ambulatory Visit: Payer: Self-pay

## 2024-03-28 ENCOUNTER — Ambulatory Visit (HOSPITAL_COMMUNITY)
Admission: RE | Admit: 2024-03-28 | Discharge: 2024-03-28 | Disposition: A | Discharge status: Home / Self Care | Attending: Cardiology | Admitting: Cardiology

## 2024-03-28 ENCOUNTER — Other Ambulatory Visit (HOSPITAL_COMMUNITY): Payer: Self-pay

## 2024-03-28 DIAGNOSIS — I2582 Chronic total occlusion of coronary artery: Secondary | ICD-10-CM | POA: Diagnosis not present

## 2024-03-28 DIAGNOSIS — I25119 Atherosclerotic heart disease of native coronary artery with unspecified angina pectoris: Secondary | ICD-10-CM | POA: Insufficient documentation

## 2024-03-28 DIAGNOSIS — Z86711 Personal history of pulmonary embolism: Secondary | ICD-10-CM | POA: Diagnosis not present

## 2024-03-28 DIAGNOSIS — E7800 Pure hypercholesterolemia, unspecified: Secondary | ICD-10-CM | POA: Insufficient documentation

## 2024-03-28 DIAGNOSIS — Z7901 Long term (current) use of anticoagulants: Secondary | ICD-10-CM | POA: Diagnosis not present

## 2024-03-28 DIAGNOSIS — I10 Essential (primary) hypertension: Secondary | ICD-10-CM

## 2024-03-28 DIAGNOSIS — N1832 Chronic kidney disease, stage 3b: Secondary | ICD-10-CM | POA: Insufficient documentation

## 2024-03-28 DIAGNOSIS — I2510 Atherosclerotic heart disease of native coronary artery without angina pectoris: Secondary | ICD-10-CM

## 2024-03-28 DIAGNOSIS — J45909 Unspecified asthma, uncomplicated: Secondary | ICD-10-CM | POA: Diagnosis not present

## 2024-03-28 DIAGNOSIS — E1122 Type 2 diabetes mellitus with diabetic chronic kidney disease: Secondary | ICD-10-CM | POA: Diagnosis not present

## 2024-03-28 DIAGNOSIS — Z7902 Long term (current) use of antithrombotics/antiplatelets: Secondary | ICD-10-CM | POA: Diagnosis not present

## 2024-03-28 DIAGNOSIS — I2584 Coronary atherosclerosis due to calcified coronary lesion: Secondary | ICD-10-CM | POA: Diagnosis not present

## 2024-03-28 DIAGNOSIS — I129 Hypertensive chronic kidney disease with stage 1 through stage 4 chronic kidney disease, or unspecified chronic kidney disease: Secondary | ICD-10-CM | POA: Diagnosis not present

## 2024-03-28 DIAGNOSIS — R252 Cramp and spasm: Secondary | ICD-10-CM | POA: Diagnosis not present

## 2024-03-28 DIAGNOSIS — Z955 Presence of coronary angioplasty implant and graft: Secondary | ICD-10-CM

## 2024-03-28 DIAGNOSIS — I25118 Atherosclerotic heart disease of native coronary artery with other forms of angina pectoris: Secondary | ICD-10-CM

## 2024-03-28 DIAGNOSIS — Z9861 Coronary angioplasty status: Secondary | ICD-10-CM

## 2024-03-28 DIAGNOSIS — Z79899 Other long term (current) drug therapy: Secondary | ICD-10-CM | POA: Diagnosis not present

## 2024-03-28 DIAGNOSIS — R600 Localized edema: Secondary | ICD-10-CM | POA: Insufficient documentation

## 2024-03-28 DIAGNOSIS — R079 Chest pain, unspecified: Secondary | ICD-10-CM

## 2024-03-28 HISTORY — PX: CORONARY STENT INTERVENTION: CATH118234

## 2024-03-28 HISTORY — PX: RIGHT/LEFT HEART CATH AND CORONARY ANGIOGRAPHY: CATH118266

## 2024-03-28 HISTORY — PX: CORONARY ULTRASOUND/IVUS: CATH118244

## 2024-03-28 HISTORY — PX: CORONARY LITHOTRIPSY: CATH118330

## 2024-03-28 LAB — POCT I-STAT 7, (LYTES, BLD GAS, ICA,H+H)
Acid-Base Excess: 1 mmol/L (ref 0.0–2.0)
Bicarbonate: 26.1 mmol/L (ref 20.0–28.0)
Calcium, Ion: 1.27 mmol/L (ref 1.15–1.40)
HCT: 34 % — ABNORMAL LOW (ref 36.0–46.0)
Hemoglobin: 11.6 g/dL — ABNORMAL LOW (ref 12.0–15.0)
O2 Saturation: 84 %
Potassium: 3.7 mmol/L (ref 3.5–5.1)
Sodium: 141 mmol/L (ref 135–145)
TCO2: 27 mmol/L (ref 22–32)
pCO2 arterial: 42.3 mmHg (ref 32–48)
pH, Arterial: 7.398 (ref 7.35–7.45)
pO2, Arterial: 49 mmHg — ABNORMAL LOW (ref 83–108)

## 2024-03-28 LAB — POCT I-STAT EG7
Acid-Base Excess: 0 mmol/L (ref 0.0–2.0)
Bicarbonate: 25.7 mmol/L (ref 20.0–28.0)
Calcium, Ion: 1.25 mmol/L (ref 1.15–1.40)
HCT: 34 % — ABNORMAL LOW (ref 36.0–46.0)
Hemoglobin: 11.6 g/dL — ABNORMAL LOW (ref 12.0–15.0)
O2 Saturation: 72 %
Potassium: 3.6 mmol/L (ref 3.5–5.1)
Sodium: 139 mmol/L (ref 135–145)
TCO2: 27 mmol/L (ref 22–32)
pCO2, Ven: 44.3 mmHg (ref 44–60)
pH, Ven: 7.373 (ref 7.25–7.43)
pO2, Ven: 39 mmHg (ref 32–45)

## 2024-03-28 LAB — GLUCOSE, CAPILLARY
Glucose-Capillary: 108 mg/dL — ABNORMAL HIGH (ref 70–99)
Glucose-Capillary: 98 mg/dL (ref 70–99)

## 2024-03-28 LAB — POCT ACTIVATED CLOTTING TIME
Activated Clotting Time: 308 s
Activated Clotting Time: 239 s

## 2024-03-28 SURGERY — RIGHT/LEFT HEART CATH AND CORONARY ANGIOGRAPHY
Anesthesia: LOCAL

## 2024-03-28 MED ORDER — MIDAZOLAM HCL 2 MG/2ML IJ SOLN
INTRAMUSCULAR | Status: AC
  Filled 2024-03-28: qty 2

## 2024-03-28 MED ORDER — ACETAMINOPHEN 325 MG PO TABS: 650.0000 mg | ORAL_TABLET | ORAL | Status: DC | PRN

## 2024-03-28 MED ORDER — CLOPIDOGREL BISULFATE 75 MG PO TABS
75.0000 mg | ORAL_TABLET | Freq: Every day | ORAL | 5 refills | Status: DC
  Filled 2024-03-28: qty 30, 30d supply, fill #0

## 2024-03-28 MED ORDER — MIDAZOLAM HCL 2 MG/2ML IJ SOLN
INTRAMUSCULAR | Status: DC | PRN
  Administered 2024-03-28: 1 mg via INTRAVENOUS

## 2024-03-28 MED ORDER — SODIUM CHLORIDE 0.9 % WEIGHT BASED INFUSION: 1.0000 mL/kg/h | INTRAVENOUS | Status: DC

## 2024-03-28 MED ORDER — ASPIRIN 81 MG PO CHEW: 81.0000 mg | CHEWABLE_TABLET | ORAL | Status: DC

## 2024-03-28 MED ORDER — VERAPAMIL HCL 2.5 MG/ML IV SOLN
INTRAVENOUS | Status: DC | PRN
  Administered 2024-03-28: 10 mL via INTRA_ARTERIAL

## 2024-03-28 MED ORDER — HEPARIN (PORCINE) IN NACL 1000-0.9 UT/500ML-% IV SOLN
INTRAVENOUS | Status: DC | PRN
Start: 2024-03-28 — End: 2024-03-28
  Administered 2024-03-28 (×2): 500 mL

## 2024-03-28 MED ORDER — CLOPIDOGREL BISULFATE 300 MG PO TABS
ORAL_TABLET | ORAL | Status: DC | PRN
  Administered 2024-03-28: 600 mg via ORAL

## 2024-03-28 MED ORDER — SODIUM CHLORIDE 0.9 % IV SOLN: INTRAVENOUS | Status: DC

## 2024-03-28 MED ORDER — FAMOTIDINE IN NACL 20-0.9 MG/50ML-% IV SOLN
INTRAVENOUS | Status: DC | PRN
  Administered 2024-03-28: 20 mg via INTRAVENOUS

## 2024-03-28 MED ORDER — VERAPAMIL HCL 2.5 MG/ML IV SOLN
INTRAVENOUS | Status: AC
Start: 2024-03-28 — End: 2024-03-28
  Filled 2024-03-28: qty 2

## 2024-03-28 MED ORDER — FREE WATER: 500.0000 mL | Freq: Once | Status: DC

## 2024-03-28 MED ORDER — IOHEXOL 350 MG/ML SOLN
INTRAVENOUS | Status: DC | PRN
  Administered 2024-03-28: 60 mL via INTRA_ARTERIAL

## 2024-03-28 MED ORDER — ONDANSETRON HCL 4 MG/2ML IJ SOLN: 4.0000 mg | Freq: Four times a day (QID) | INTRAMUSCULAR | Status: DC | PRN

## 2024-03-28 MED ORDER — HEPARIN SODIUM (PORCINE) 1000 UNIT/ML IJ SOLN
INTRAMUSCULAR | Status: AC
  Filled 2024-03-28: qty 10

## 2024-03-28 MED ORDER — SODIUM CHLORIDE 0.9% FLUSH: 3.0000 mL | INTRAVENOUS | Status: DC | PRN

## 2024-03-28 MED ORDER — HEPARIN SODIUM (PORCINE) 1000 UNIT/ML IJ SOLN
INTRAMUSCULAR | Status: DC | PRN
  Administered 2024-03-28: 5000 [IU] via INTRAVENOUS
  Administered 2024-03-28: 3000 [IU] via INTRAVENOUS
  Administered 2024-03-28: 5000 [IU] via INTRAVENOUS

## 2024-03-28 MED ORDER — LIDOCAINE HCL (PF) 1 % IJ SOLN
INTRAMUSCULAR | Status: DC | PRN
  Administered 2024-03-28 (×2): 2 mL

## 2024-03-28 MED ORDER — PHENYLEPHRINE 80 MCG/ML (10ML) SYRINGE FOR IV PUSH (FOR BLOOD PRESSURE SUPPORT)
PREFILLED_SYRINGE | INTRAVENOUS | Status: AC
Start: 2024-03-28 — End: 2024-03-28
  Filled 2024-03-28: qty 20

## 2024-03-28 MED ORDER — SODIUM CHLORIDE 0.9 % IV SOLN: 250.0000 mL | INTRAVENOUS | Status: DC | PRN

## 2024-03-28 MED ORDER — CLOPIDOGREL BISULFATE 300 MG PO TABS
ORAL_TABLET | ORAL | Status: AC
  Filled 2024-03-28: qty 2

## 2024-03-28 MED ORDER — PANTOPRAZOLE SODIUM 40 MG PO TBEC
40.0000 mg | DELAYED_RELEASE_TABLET | Freq: Every day | ORAL | 1 refills | Status: AC
Start: 2024-03-28 — End: 2025-03-28
  Filled 2024-03-28: qty 30, 30d supply, fill #0

## 2024-03-28 MED ORDER — NITROGLYCERIN 1 MG/10 ML FOR IR/CATH LAB
INTRA_ARTERIAL | Status: AC
  Filled 2024-03-28: qty 10

## 2024-03-28 MED ORDER — FENTANYL CITRATE (PF) 100 MCG/2ML IJ SOLN
INTRAMUSCULAR | Status: DC | PRN
  Administered 2024-03-28: 25 ug via INTRAVENOUS

## 2024-03-28 MED ORDER — ASPIRIN 81 MG PO CHEW
81.0000 mg | CHEWABLE_TABLET | Freq: Every day | ORAL | 0 refills | Status: DC
  Filled 2024-03-28: qty 30, 30d supply, fill #0

## 2024-03-28 MED ORDER — LIDOCAINE HCL (PF) 1 % IJ SOLN
INTRAMUSCULAR | Status: AC
Start: 2024-03-28 — End: 2024-03-28
  Filled 2024-03-28: qty 30

## 2024-03-28 MED ORDER — SODIUM CHLORIDE 0.9 % WEIGHT BASED INFUSION
1.0000 mL/kg/h | INTRAVENOUS | Status: DC
  Administered 2024-03-28: 1 mL/kg/h via INTRAVENOUS

## 2024-03-28 MED ORDER — SODIUM CHLORIDE 0.9% FLUSH: 3.0000 mL | Freq: Two times a day (BID) | INTRAVENOUS | Status: DC

## 2024-03-28 MED ORDER — FAMOTIDINE IN NACL 20-0.9 MG/50ML-% IV SOLN
INTRAVENOUS | Status: DC
Start: 2024-03-28 — End: 2024-03-28
  Filled 2024-03-28: qty 50

## 2024-03-28 MED ORDER — OXYCODONE-ACETAMINOPHEN 5-325 MG PO TABS
1.0000 | ORAL_TABLET | Freq: Once | ORAL | Status: AC
  Administered 2024-03-28: 1 via ORAL
  Filled 2024-03-28: qty 1

## 2024-03-28 MED ORDER — FENTANYL CITRATE (PF) 100 MCG/2ML IJ SOLN
INTRAMUSCULAR | Status: AC
  Filled 2024-03-28: qty 2

## 2024-03-28 SURGICAL SUPPLY — 17 items
BALLOON ~~LOC~~ EMERGE MR 3.5X20 (BALLOONS) IMPLANT
CATH 5FR JL3.5 JR4 ANG PIG MP (CATHETERS) IMPLANT
CATH BALLN WEDGE 5F 110CM (CATHETERS) IMPLANT
CATH OPTICROSS HD (CATHETERS) IMPLANT
CATH SHOCKWAVE C2 4.0X12 (CATHETERS) IMPLANT
CATH VISTA GUIDE 6FR JR4 ECOPK (CATHETERS) IMPLANT
DEVICE RAD COMP TR BAND LRG (VASCULAR PRODUCTS) IMPLANT
DRAPE IVUS SLED (BAG) IMPLANT
GLIDESHEATH SLEND A-KIT 6F 22G (SHEATH) IMPLANT
GUIDEWIRE INQWIRE 1.5J.035X260 (WIRE) IMPLANT
KIT ENCORE 26 ADVANTAGE (KITS) IMPLANT
KIT HEMO VALVE WATCHDOG (MISCELLANEOUS) IMPLANT
PACK CARDIAC CATHETERIZATION (CUSTOM PROCEDURE TRAY) ×1 IMPLANT
SET ATX-X65L (MISCELLANEOUS) IMPLANT
SHEATH GLIDE SLENDER 4/5FR (SHEATH) IMPLANT
STENT SYNERGY XD 3.50X20 (Permanent Stent) IMPLANT
WIRE RUNTHROUGH .014X180CM (WIRE) IMPLANT

## 2024-03-28 NOTE — Discharge Summary (Signed)
 Discharge Summary for Same Day PCI   Patient ID: Lauren Hamilton MRN: [id number]; DOB: April 05, 1946  Admit date: 03/28/2024 Discharge date: 03/28/2024  Primary Care Provider: Maree Isles, MD  Primary Cardiologist: Alvan Carrier, MD  Primary Electrophysiologist:  None   Discharge Diagnoses    Active Problems:   CAD (coronary artery disease)   Diagnostic Studies/Procedures    Cardiac Catheterization 03/28/2024:  Hemodynamic data: RA 14/12, mean 12 mmHg. RV 41/6, EDP 12 mmHg. PA 39/21, mean 30 mmHg. PW 18/16, mean 15 mmHg. PA saturation 70%, AO saturation 84%. QP/QS 1.0.  CO 9.07, CI 4.29 by Fick. PVR 1.65.   LV 131/5, EDP 18 mmHg.  Ao 123/69, mean 92 mmHg.  No pressure gradient across the aortic valve.   Angiographic data: RCA: Very large caliber vessel and dominant vessel, moderate heavily calcified mid segment stenosis of 80%.  Gives origin to 2 moderate-sized PDA and a very large PL branch.  Distal RCA is diffusely diseased with PL branch having a 40% tandem stenosis. LM: Mildly calcified but widely patent. LCx: Moderate caliber vessel giving origin to a very large OM 3 with a secondary branch.  Cx is CTO in the midsegment of the origin of small OM1, there are bridging collaterals.  TIMI III flow is evident in the marginal. LAD: It is a moderate caliber vessel, mildly diffusely diseased and mildly calcified.  Gives origin to small diagonals.   Intervention data: IVUS guided PCI to proximal RCA with implantation of a 3.5 x 20 mm Synergy XD DES at 16 atmospheric pressure, stenosis reduced from 80% to 0%, MLA 2.2 mm prestent, 6.6 mm post stent.       Impression and recommendations: Aspirin for 2 to 4 weeks, Plavix for 6 months, resume Eliquis  tomorrow.  CTO CX can be treated medically with brisk flow there is evident on that she has symptomatic angina.   _____________   History of Present Illness     Lauren Hamilton is a 78 y.o. female with a PMH of chest  pain, hypertension, type 2 diabetes, history of bilateral PE, hypercholesterolemia, leg edema, and CKD, who presented to the offfice for follow up on 9/18. Had outpatient stress testing which was abnormal with inferolateral ischemia. She continued to complain of DOE and chest pain despite being started on medical therapy. Was set up for outpatient cardiac cath.   Hospital Course     The patient underwent cardiac cath as noted above with IVUS guided PCI/DES to pRCA, CTO of pLCX with bridging collaterals. Plan for DAPT with ASA/plavix/eliquis  x1 month and then drop ASA with continuation of plavix/eliquis . The patient was seen by Dr. cardiac rehab while in short stay. There were no observed complications post cath. Radial cath site was re-evaluated prior to discharge and found to be stable without any complications. Instructions/precautions regarding cath site care were given prior to discharge.  Lauren Hamilton was seen by Dr. Ladona and determined stable for discharge home. Follow up with our office has been arranged. Medications are listed below. Pertinent changes include addition of ASA, plavix and protonix . _____________  Cath/PCI Registry Performance & Quality Measures: Aspirin prescribed? - Yes ADP Receptor Inhibitor (Plavix/Clopidogrel, Brilinta/Ticagrelor or Effient/Prasugrel) prescribed (includes medically managed patients)? - Yes High Intensity Statin (Lipitor 40-80mg  or Crestor  20-40mg ) prescribed? - No - intolerant For EF <40%, was ACEI/ARB prescribed? - Not Applicable (EF >/= 40%) For EF <40%, Aldosterone Antagonist (Spironolactone  or Eplerenone) prescribed? - Not Applicable (EF >/= 40%) Cardiac Rehab Phase II  ordered (Included Medically managed Patients)? - Yes  _____________   Discharge Vitals Blood pressure (!) 146/71, pulse 81, temperature 98.2 F (36.8 C), resp. rate 17, height 5' 7 (1.702 m), weight 101.2 kg, SpO2 92%.  Filed Weights   03/28/24 0639 03/28/24 0700  Weight:  101.2 kg 101.2 kg    Last Labs & Radiologic Studies    CBC Recent Labs    03/26/24 0833 03/28/24 1150 03/28/24 1153  WBC 6.0  --   --   HGB 12.3 11.6* 11.6*  HCT 38.3 34.0* 34.0*  MCV 91.2  --   --   PLT 175  --   --    Basic Metabolic Panel Recent Labs    89/83/74 1150 03/28/24 1153  NA 141 139  K 3.7 3.6   Liver Function Tests No results for input(s): AST, ALT, ALKPHOS, BILITOT, PROT, ALBUMIN  in the last 72 hours. No results for input(s): LIPASE, AMYLASE in the last 72 hours. High Sensitivity Troponin:   No results for input(s): TROPONINIHS in the last 720 hours.  BNP Invalid input(s): POCBNP D-Dimer No results for input(s): DDIMER in the last 72 hours. Hemoglobin A1C No results for input(s): HGBA1C in the last 72 hours. Fasting Lipid Panel No results for input(s): CHOL, HDL, LDLCALC, TRIG, CHOLHDL, LDLDIRECT in the last 72 hours. Thyroid  Function Tests No results for input(s): TSH, T4TOTAL, T3FREE, THYROIDAB in the last 72 hours.  Invalid input(s): FREET3 _____________  CARDIAC CATHETERIZATION Result Date: 03/28/2024 Images from the original result were not included. Cardiac Catheterization 03/28/24: Hemodynamic data: RA 14/12, mean 12 mmHg. RV 41/6, EDP 12 mmHg. PA 39/21, mean 30 mmHg. PW 18/16, mean 15 mmHg. PA saturation 70%, AO saturation 84%. QP/QS 1.0.  CO 9.07, CI 4.29 by Fick. PVR 1.65. LV 131/5, EDP 18 mmHg.  Ao 123/69, mean 92 mmHg.  No pressure gradient across the aortic valve. Angiographic data: RCA: Very large caliber vessel and dominant vessel, moderate heavily calcified mid segment stenosis of 80%.  Gives origin to 2 moderate-sized PDA and a very large PL branch.  Distal RCA is diffusely diseased with PL branch having a 40% tandem stenosis. LM: Mildly calcified but widely patent. LCx: Moderate caliber vessel giving origin to a very large OM 3 with a secondary branch.  Cx is CTO in the midsegment of the origin  of small OM1, there are bridging collaterals.  TIMI III flow is evident in the marginal. LAD: It is a moderate caliber vessel, mildly diffusely diseased and mildly calcified.  Gives origin to small diagonals. Intervention data: IVUS guided PCI to proximal RCA with implantation of a 3.5 x 20 mm Synergy XD DES at 16 atmospheric pressure, stenosis reduced from 80% to 0%, MLA 2.2 mm prestent, 6.6 mm post stent.  Impression and recommendations: Aspirin for 2 to 4 weeks, Plavix for 6 months, resume Eliquis  tomorrow.  CTO CX can be treated medically with brisk flow there is evident on that she has symptomatic angina.    Disposition   Pt is being discharged home today in good condition.  Follow-up Plans & Appointments     Discharge Instructions     Amb Referral to Cardiac Rehabilitation   Complete by: As directed    Diagnosis: Coronary Stents   After initial evaluation and assessments completed: Virtual Based Care may be provided alone or in conjunction with Phase 2 Cardiac Rehab based on patient barriers.: Yes   Intensive Cardiac Rehabilitation (ICR) [facility] location only OR Traditional Cardiac Rehabilitation (TCR) *If criteria  for ICR are not met will enroll in TCR [facility] only): Yes        Discharge Medications   Allergies as of 03/28/2024       Reactions   Linagliptin  Shortness Of Breath, Cough   Latex Rash   Reaction is only with internal contact   Clonidine Derivatives    Dizzy, dry mouth   Etodolac    Unknown reaction   Hydralazine     Pt unsure of reaction    Hydrocodone Other (See Comments)   Altered mental status-Hallucination, tolerated in low doses    Naproxen Other (See Comments)   Damage to kidney   Statins Cough    tolerates lovastatin    Tramadol    Patient, Felt funny, didn't feel right, and felt weird.   Amlodipine Cough   Lisinopril  Cough   Losartan Cough        Medication List     STOP taking these medications    omeprazole 40 MG capsule Commonly  known as: PRILOSEC       TAKE these medications    allopurinol  300 MG tablet Commonly known as: ZYLOPRIM  Take 300 mg by mouth at bedtime.   apixaban  5 MG Tabs tablet Commonly known as: ELIQUIS  Take 1 tablet (5 mg total) by mouth 2 (two) times daily. Start taking on: March 29, 2024   Aspirin Low Dose 81 MG chewable tablet Generic drug: aspirin Chew 1 tablet (81 mg total) by mouth daily.   benzonatate 200 MG capsule Commonly known as: TESSALON Take 200 mg by mouth 3 (three) times daily as needed for cough.   clopidogrel 75 MG tablet Commonly known as: Plavix Take 1 tablet (75 mg total) by mouth daily.   Farxiga 5 MG Tabs tablet Generic drug: dapagliflozin propanediol Take 5 mg by mouth every Monday, Wednesday, and Friday.   FISH OIL PO Take 1 capsule by mouth daily.   fluticasone  furoate-vilanterol 100-25 MCG/ACT Aepb Commonly known as: Breo Ellipta  Inhale 1 puff into the lungs daily.   furosemide  20 MG tablet Commonly known as: LASIX  Take 1-2 tablets (20-40 mg total) by mouth See admin instructions. Take 20 mg by mouth in the morning and 40 mg in the evening Start taking on: March 30, 2024 What changed:  how much to take when to take this additional instructions These instructions start on March 30, 2024. If you are unsure what to do until then, ask your doctor or other care provider.   isosorbide  mononitrate 120 MG 24 hr tablet Commonly known as: IMDUR  Take 1 tablet (120 mg total) by mouth daily.   ketoconazole 2 % shampoo Commonly known as: NIZORAL Apply 1 Application topically daily as needed for irritation.   ketoconazole 2 % cream Commonly known as: NIZORAL Apply 1 Application topically daily as needed for irritation.   MAGNESIUM  PO Take 1 tablet by mouth daily.   metoprolol  succinate 25 MG 24 hr tablet Commonly known as: TOPROL -XL Take 25 mg by mouth daily.   montelukast 10 MG tablet Commonly known as: SINGULAIR Take 10 mg by mouth at  bedtime.   OneTouch Delica Plus Lancet33G Misc SMARTSIG:11.9 Topical Daily   pantoprazole  40 MG tablet Commonly known as: Protonix  Take 1 tablet (40 mg total) by mouth daily.   rizatriptan 10 MG tablet Commonly known as: MAXALT Take 10 mg by mouth as needed for migraine.   rosuvastatin  10 MG tablet Commonly known as: CRESTOR  Take 1 tablet (10 mg total) by mouth every evening.   Soothe  XP Soln Apply 1-2 drops to eye as needed (dry eyes).   spironolactone  25 MG tablet Commonly known as: ALDACTONE  Take 1 tablet (25 mg total) by mouth every Tuesday, Thursday, Saturday, and Sunday. Start taking on: April 01, 2024 What changed: These instructions start on April 01, 2024. If you are unsure what to do until then, ask your doctor or other care provider.   vitamin C 250 MG tablet Commonly known as: ASCORBIC ACID Take 250 mg by mouth daily.   vitamin E 200 UNIT capsule Take 200 Units by mouth daily.           Allergies Allergies  Allergen Reactions   Linagliptin  Shortness Of Breath and Cough   Latex Rash    Reaction is only with internal contact    Clonidine Derivatives     Dizzy, dry mouth   Etodolac     Unknown reaction   Hydralazine      Pt unsure of reaction    Hydrocodone Other (See Comments)    Altered mental status-Hallucination, tolerated in low doses    Naproxen Other (See Comments)    Damage to kidney    Statins Cough     tolerates lovastatin    Tramadol     Patient, Felt funny, didn't feel right, and felt weird.   Amlodipine Cough   Lisinopril  Cough   Losartan Cough    Outstanding Labs/Studies   N/a   Duration of Discharge Encounter   Greater than 30 minutes including physician time.  Signed, Manuelita Rummer, NP 03/28/2024, 4:28 PM

## 2024-03-28 NOTE — Progress Notes (Signed)
 Patient and patient daughter given discharge instructions, education provided no further questions at this time. Patient able to ambulate and void before discharge. Able to tolerate PO intake. Patient site is clean, dry, intact and soft upon discharge. MD Ladona called in order to review home medications start date of Plavix, aspirin, and eliquis  as well as Protonix  and lasix . This was then verbally repeated back to patient and family as well was written on AVS packet for them to take home. Patient complains of a 9/10 migraine MD Ganji made aware please see medications ordered. IV removed.

## 2024-03-28 NOTE — Progress Notes (Addendum)
 Pt was educated on stent card, stent location, Antiplatelet and ASA use, wt restrictions, no baths/daily wash-ups, s/s of infection, ex guidelines, s/s to stop exercising, NTG use and calling 911, heart healthy diet, risk factors and CRPII. Pt received materials on exercise, diet, and CRPII. Will refer to AP.   Pt plans to return to silver sneakers.   Garen FORBES Candy MS, ACSM CEP 03/28/2024 2:43 PM

## 2024-03-28 NOTE — Interval H&P Note (Signed)
 History and Physical Interval Note:  03/28/2024 11:32 AM  Lauren Hamilton  has presented today for surgery, with the diagnosis of chest pain.  The various methods of treatment have been discussed with the patient and family. After consideration of risks, benefits and other options for treatment, the patient has consented to  Procedure(s): RIGHT/LEFT HEART CATH AND CORONARY ANGIOGRAPHY (N/A) and possible coronary angioplasty for class 3 angina pectoris in spite of > 2 antianginal Rx, as a surgical intervention.  The patient's history has been reviewed, patient examined, no change in status, stable for surgery.  I have reviewed the patient's chart and labs.  Questions were answered to the patient's satisfaction.     Gordy Bergamo

## 2024-03-29 ENCOUNTER — Encounter (HOSPITAL_COMMUNITY): Payer: Self-pay

## 2024-03-29 MED FILL — Nitroglycerin IV Soln 100 MCG/ML in D5W: INTRA_ARTERIAL | Qty: 10 | Status: AC

## 2024-04-04 ENCOUNTER — Ambulatory Visit: Admitting: Nurse Practitioner

## 2024-04-04 DIAGNOSIS — I4891 Unspecified atrial fibrillation: Secondary | ICD-10-CM | POA: Diagnosis not present

## 2024-04-04 DIAGNOSIS — I10 Essential (primary) hypertension: Secondary | ICD-10-CM | POA: Diagnosis not present

## 2024-04-04 DIAGNOSIS — N189 Chronic kidney disease, unspecified: Secondary | ICD-10-CM | POA: Diagnosis not present

## 2024-04-04 DIAGNOSIS — D631 Anemia in chronic kidney disease: Secondary | ICD-10-CM | POA: Diagnosis not present

## 2024-04-04 DIAGNOSIS — E211 Secondary hyperparathyroidism, not elsewhere classified: Secondary | ICD-10-CM | POA: Diagnosis not present

## 2024-04-04 DIAGNOSIS — G43909 Migraine, unspecified, not intractable, without status migrainosus: Secondary | ICD-10-CM | POA: Diagnosis not present

## 2024-04-04 DIAGNOSIS — Z299 Encounter for prophylactic measures, unspecified: Secondary | ICD-10-CM | POA: Diagnosis not present

## 2024-04-04 DIAGNOSIS — R809 Proteinuria, unspecified: Secondary | ICD-10-CM | POA: Diagnosis not present

## 2024-04-04 DIAGNOSIS — R52 Pain, unspecified: Secondary | ICD-10-CM | POA: Diagnosis not present

## 2024-04-04 DIAGNOSIS — J329 Chronic sinusitis, unspecified: Secondary | ICD-10-CM | POA: Diagnosis not present

## 2024-04-04 DIAGNOSIS — E1169 Type 2 diabetes mellitus with other specified complication: Secondary | ICD-10-CM | POA: Diagnosis not present

## 2024-04-05 ENCOUNTER — Encounter (HOSPITAL_COMMUNITY)
Admission: RE | Admit: 2024-04-05 | Discharge: 2024-04-05 | Disposition: A | Discharge status: Home / Self Care | Source: Ambulatory Visit | Attending: Cardiology | Admitting: Cardiology

## 2024-04-05 DIAGNOSIS — Z95820 Peripheral vascular angioplasty status with implants and grafts: Secondary | ICD-10-CM | POA: Insufficient documentation

## 2024-04-05 NOTE — Progress Notes (Signed)
 Virtual orientation visit completed for cardiac rehab with angioplasty with stent placement. On-site orientation visit scheduled for 04/18/24 at 10:30 pending clearance from Almarie Crate, NP.

## 2024-04-06 ENCOUNTER — Other Ambulatory Visit: Payer: Self-pay | Admitting: Adult Health

## 2024-04-08 ENCOUNTER — Encounter (HOSPITAL_COMMUNITY)

## 2024-04-11 ENCOUNTER — Telehealth: Payer: Self-pay | Admitting: Cardiology

## 2024-04-11 DIAGNOSIS — E1122 Type 2 diabetes mellitus with diabetic chronic kidney disease: Secondary | ICD-10-CM | POA: Diagnosis not present

## 2024-04-11 DIAGNOSIS — N1832 Chronic kidney disease, stage 3b: Secondary | ICD-10-CM | POA: Diagnosis not present

## 2024-04-11 DIAGNOSIS — R809 Proteinuria, unspecified: Secondary | ICD-10-CM | POA: Diagnosis not present

## 2024-04-11 DIAGNOSIS — I5032 Chronic diastolic (congestive) heart failure: Secondary | ICD-10-CM | POA: Diagnosis not present

## 2024-04-11 NOTE — Telephone Encounter (Signed)
 Returned call to pt and provider response relayed.

## 2024-04-11 NOTE — Telephone Encounter (Signed)
 Will forward to provider

## 2024-04-11 NOTE — Telephone Encounter (Signed)
 Pt c/o medication issue:  1. Name of Medication:   Prilosec  2. How are you currently taking this medication (dosage and times per day)?   Not currently taking  3. Are you having a reaction (difficulty breathing--STAT)?   4. What is your medication issue?   Patient stated she had a heart cath on 10/16 and was taken off Prilosec but is now having very bad heart burn and wants to know if she can re-start this medication.

## 2024-04-11 NOTE — Telephone Encounter (Signed)
 Reports indigestion since d/c on 03/28/2024 and stopping omeprazole. Reports active mild chest pain rated 4/10. Reports chest pain is mild and not bad as before heart cath. Reports headache and a little lightheaded since d/c on 03/28/2024. Reports she was advised to hold her Maxalt for 5 days d/c and has and felt headache,nausea, burping, and mucous production since that time. Reports she is taking pantoprazole  40 mg daily but doesn't feel like its working. Reports headache and nausea is better but still feels a little lightheaded. Reports seeing PCP last week and was given an ABT for sinus issues and has now completed ABT. Medications reviewed. Advised to keep visit on 04/18/2024 and if she develops worsening symptoms, she would need an ED evaluation. Verbalized understanding.

## 2024-04-12 ENCOUNTER — Encounter: Payer: Self-pay | Admitting: Adult Health

## 2024-04-12 ENCOUNTER — Ambulatory Visit: Admitting: Adult Health

## 2024-04-12 ENCOUNTER — Telehealth: Payer: Self-pay

## 2024-04-12 VITALS — BP 112/70 | HR 88 | Ht 67.0 in | Wt 222.6 lb

## 2024-04-12 DIAGNOSIS — I2782 Chronic pulmonary embolism: Secondary | ICD-10-CM

## 2024-04-12 DIAGNOSIS — G4733 Obstructive sleep apnea (adult) (pediatric): Secondary | ICD-10-CM

## 2024-04-12 DIAGNOSIS — I2602 Saddle embolus of pulmonary artery with acute cor pulmonale: Secondary | ICD-10-CM

## 2024-04-12 DIAGNOSIS — N1832 Chronic kidney disease, stage 3b: Secondary | ICD-10-CM

## 2024-04-12 DIAGNOSIS — J4530 Mild persistent asthma, uncomplicated: Secondary | ICD-10-CM

## 2024-04-12 DIAGNOSIS — J310 Chronic rhinitis: Secondary | ICD-10-CM

## 2024-04-12 DIAGNOSIS — R0609 Other forms of dyspnea: Secondary | ICD-10-CM

## 2024-04-12 NOTE — Progress Notes (Signed)
 @Patient  ID: Lauren Hamilton, female    DOB: 07/28/1945, 78 y.o.   MRN: [id number]  Chief Complaint  Patient presents with   Follow-up    Osa, asthma, chronic saddle PE    Referring provider: Maree Isles, MD  HPI: 78 year old female never smoker seen for consult December 2024 for dyspnea found to have mild persistent asthma. 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 Low to intermediate risk study   No ST deviation was noted.   LV perfusion is abnormal. Moderate size mild to moderate intensity inferior/inferolateral/inferoapical defect with mild to moderate reversibility.   Left ventricular function is normal. Nuclear stress EF: 73%. The left ventricular ejection fraction is hyperdynamic (>65%). End diastolic cavity size is normal. 07/24/2022 on RA  patient completed 300 ft  at a moderate pace, slowed down for another 155ft  d/t twinge in her chest but no sob/ lowest sats 92%  Chest x-ray May 24, 2023 coarsened interstitial markings. Echo 03/2023 EF nml, Gr 1 DD , Nml PAP    Pulmonary function testing done August 29, 2023 showed mild to moderate restriction and airflow obstruction with complete reversibility consistent with asthma.  FEV1 69%, ratio 72, FVC 72%, post bronchodilator response with a 32% change, FEV1 postbronchodilator 92%, ratio 79, FVC 87%, DLCO 65%   High-resolution CT chest November 13, 2023 possible mild subpleural reticulation and ground glass bibasilar.   Scarring in the right lower lobe.  Minimum consolidation left upper lobe-unable to exclude interstitial lung disease may be postinfectious/postinflammatory scarring.   Discussed the use of AI scribe software for clinical note transcription with the patient, who gave verbal consent to proceed.  History of Present Illness Lauren Hamilton is a 78 year old female with  asthma who presents for 83-month follow-up.    Patient was admitted earlier this month.  She underwent a cardiac catheterization which revealed a blockage, leading to stent placement. Post-procedure, she was started on Plavix, and a baby aspirin. She experiences bruising but no significant bleeding,  She is scheduled to see her cardiologist next Thursday.  She says that since stent placement she is doing some better.  Still remains some weak and starting to regain her energy.  She is getting ready to begin cardiac rehab.  She experiences shortness of breath, which has improved since the stent placement. She is not very active but can now walk across a room without significant dyspnea. She plans to resume Silver Sneakers for exercise.  She is also going to begin cardiac rehab.    She says overall asthma is improved feels that Ranell is helping.  She is also using Singulair daily.  Previous CT chest June 2025 showed some mild bibasilar reticulation/scarring possible postinflammatory/infectious scarring.  She has a history of migraines, recently has had an uptick in chronic headaches which she feels has been exacerbated by chronic sinus congestion.   She uses Maxalt for migraines and over-the-counter mucus relief for sinus drainage.   She uses a CPAP machine for sleep apnea and reports wearing it every night with benefit. She maintains the machine by  cleaning it regularly and using distilled water .            Allergies  Allergen Reactions   Linagliptin  Shortness Of Breath and Cough   Latex Rash    Reaction is only with internal  contact    Clonidine Derivatives     Dizzy, dry mouth   Etodolac     Unknown reaction   Hydralazine      Pt unsure of reaction    Hydrocodone Other (See Comments)    Altered mental status-Hallucination, tolerated in low doses    Naproxen Other (See Comments)    Damage to kidney    Statins Cough     tolerates lovastatin    Tramadol     Patient, Felt funny, didn't feel right, and felt weird.   Amlodipine Cough   Lisinopril  Cough   Losartan Cough     There is no immunization history on file for this patient.  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 off anticoagulation   Bronchitis    Cancer (HCC)    Skin   Diabetes mellitus without complication (HCC)    Gout    H/O colonoscopy with polypectomy    Hypercholesteremia    Hypertension    Renal disorder    Renal insufficiency     Tobacco History: Social History   Tobacco Use  Smoking Status Never   Passive exposure: Past  Smokeless Tobacco Never   Counseling given: Not Answered   Outpatient Medications Prior to Visit  Medication Sig Dispense Refill   allopurinol  (ZYLOPRIM ) 300 MG tablet Take 300 mg by mouth at bedtime.     apixaban  (ELIQUIS ) 5 MG TABS tablet Take 1 tablet (5 mg total) by mouth 2 (two) times daily.     Artificial Tear Solution (SOOTHE XP) SOLN Apply 1-2 drops to eye as needed (dry eyes).      aspirin (ASPIRIN CHILDRENS) 81 MG chewable tablet Chew 1 tablet (81 mg total) by mouth daily. 30 tablet 0   benzonatate (TESSALON) 200 MG capsule Take 200 mg by mouth 3 (three) times daily as needed for cough.     BREO ELLIPTA  100-25 MCG/ACT AEPB TAKE 1 PUFF BY MOUTH EVERY DAY 60 each 5   clopidogrel (PLAVIX) 75 MG tablet Take 1 tablet (75 mg total) by mouth daily. 30 tablet 5   dapagliflozin propanediol (FARXIGA) 5 MG TABS  tablet Take 5 mg by mouth every Monday, Wednesday, and Friday.     furosemide  (LASIX ) 20 MG tablet Take 1-2 tablets (20-40 mg total) by mouth See admin instructions. Take 20 mg by mouth in the morning and 40 mg in the evening     isosorbide  mononitrate (IMDUR ) 120 MG 24 hr tablet Take 1 tablet (120 mg total) by mouth daily. 90 tablet 1   ketoconazole (NIZORAL) 2 % cream Apply 1 Application topically daily as needed for irritation.     ketoconazole (NIZORAL) 2 % shampoo Apply 1 Application topically daily as needed for irritation.     MAGNESIUM  PO Take 1 tablet by mouth daily.     metoprolol  succinate (TOPROL -XL) 25 MG 24 hr tablet Take 25 mg by mouth daily.     montelukast (SINGULAIR) 10 MG tablet Take 10 mg by mouth at bedtime.     Omega-3 Fatty Acids (FISH OIL PO)  Take 1 capsule by mouth daily.     pantoprazole  (PROTONIX ) 40 MG tablet Take 1 tablet (40 mg total) by mouth daily. 30 tablet 1   rizatriptan (MAXALT) 10 MG tablet Take 10 mg by mouth daily as needed for migraine.     rosuvastatin  (CRESTOR ) 10 MG tablet Take 1 tablet (10 mg total) by mouth every evening. 30 tablet 1   spironolactone  (ALDACTONE ) 25 MG tablet Take 1 tablet (25 mg total) by mouth every Tuesday, Thursday, Saturday, and Sunday.     vitamin C (ASCORBIC ACID) 250 MG tablet Take 250 mg by mouth daily.     vitamin E 200 UNIT capsule Take 200 Units by mouth daily.     Lancets (ONETOUCH DELICA PLUS LANCET33G) MISC SMARTSIG:11.9 Topical Daily     No facility-administered medications prior to visit.     Review of Systems:   Constitutional:   No  weight loss, night sweats,  Fevers, chills, +fatigue, or  lassitude.  HEENT:   No headaches,  Difficulty swallowing,  Tooth/dental problems, or  Sore throat,                No sneezing, itching, ear ache, nasal congestion, post nasal drip,   CV:  No chest pain,  Orthopnea, PND, swelling in lower extremities, anasarca, dizziness, palpitations, syncope.   GI  No heartburn,  indigestion, abdominal pain, nausea, vomiting, diarrhea, change in bowel habits, loss of appetite, bloody stools.   Resp: .  No chest wall deformity  Skin: no rash or lesions.  GU: no dysuria, change in color of urine, no urgency or frequency.  No flank pain, no hematuria   MS:  No joint pain or swelling.  No decreased range of motion.  No back pain.    Physical Exam  BP 112/70   Pulse 88   Ht 5' 7 (1.702 m)   Wt 222 lb 9.6 oz (101 kg)   SpO2 96% Comment: RA  BMI 34.86 kg/m   GEN: A/Ox3; pleasant , NAD, well nourished    HEENT:  [place]/AT,  NOSE-clear, THROAT-clear, no lesions, no postnasal drip or exudate noted.   NECK:  Supple w/ fair ROM; no JVD; normal carotid impulses w/o bruits; no thyromegaly or nodules palpated; no lymphadenopathy.    RESP  Clear  P & A; w/o, wheezes/ rales/ or rhonchi. no accessory muscle use, no dullness to percussion  CARD:  RRR, no m/r/g, tr  peripheral edema, pulses intact, no cyanosis or clubbing.  GI:   Soft & nt; nml bowel sounds; no organomegaly or masses detected.   Musco: Warm bil, no deformities or joint swelling noted.   Neuro: alert, no focal deficits noted.    Skin: Warm, no lesions or rashes    Lab Results:Reviewed 04/12/2024   CBC    Component Value Date/Time   WBC 6.0 03/26/2024 0833   RBC 4.20 03/26/2024 0833   HGB 11.6 (L) 03/28/2024 1153   HGB 12.5 05/24/2023 1505   HCT 34.0 (L) 03/28/2024 1153   HCT 38.0 05/24/2023 1505   PLT 175 03/26/2024 0833   PLT 203 05/24/2023 1505   MCV 91.2 03/26/2024 0833   MCV 91 05/24/2023 1505   MCH 29.3 03/26/2024 0833   MCHC 32.1 03/26/2024 0833   RDW 15.2 (H) 03/26/2024 0833   RDW 14.8 05/24/2023 1505   LYMPHSABS 2.7 05/24/2023 1505   MONOABS 0.5 10/31/2019 1250   EOSABS 197 11/03/2023 1339   EOSABS 0.3 05/24/2023 1505   BASOSABS 38 11/03/2023  1339   BASOSABS 0.0 05/24/2023 1505    BMET    Component Value Date/Time   NA 139 03/28/2024 1153   NA 144 05/24/2023 1505   K  3.6 03/28/2024 1153   CL 103 03/08/2024 1116   CO2 26 03/08/2024 1116   GLUCOSE 112 03/08/2024 1116   BUN 38 (H) 03/08/2024 1116   BUN 41 (H) 05/24/2023 1505   CREATININE 1.36 (H) 03/08/2024 1116   CALCIUM  10.4 03/08/2024 1116   GFRNONAA 14 (L) 06/21/2021 0537   GFRNONAA 42 (L) 03/11/2020 0930   GFRAA 48 (L) 03/11/2020 0930    BNP    Component Value Date/Time   BNP 19 01/15/2024 1126    ProBNP No results found for: PROBNP  Imaging: CARDIAC CATHETERIZATION Result Date: 03/28/2024 Images from the original result were not included. Cardiac Catheterization 03/28/24: Hemodynamic data: RA 14/12, mean 12 mmHg. RV 41/6, EDP 12 mmHg. PA 39/21, mean 30 mmHg. PW 18/16, mean 15 mmHg. PA saturation 70%, AO saturation 84%. QP/QS 1.0.  CO 9.07, CI 4.29 by Fick. PVR 1.65. LV 131/5, EDP 18 mmHg.  Ao 123/69, mean 92 mmHg.  No pressure gradient across the aortic valve. Angiographic data: RCA: Very large caliber vessel and dominant vessel, moderate heavily calcified mid segment stenosis of 80%.  Gives origin to 2 moderate-sized PDA and a very large PL branch.  Distal RCA is diffusely diseased with PL branch having a 40% tandem stenosis. LM: Mildly calcified but widely patent. LCx: Moderate caliber vessel giving origin to a very large OM 3 with a secondary branch.  Cx is CTO in the midsegment of the origin of small OM1, there are bridging collaterals.  TIMI III flow is evident in the marginal. LAD: It is a moderate caliber vessel, mildly diffusely diseased and mildly calcified.  Gives origin to small diagonals. Intervention data: IVUS guided PCI to proximal RCA with implantation of a 3.5 x 20 mm Synergy XD DES at 16 atmospheric pressure, stenosis reduced from 80% to 0%, MLA 2.2 mm prestent, 6.6 mm post stent.  Impression and recommendations: Aspirin for 2 to 4 weeks, Plavix for 6 months, resume Eliquis  tomorrow.  CTO CX can be treated medically with brisk flow there is evident on that she has symptomatic  angina.    Administration History     None          Latest Ref Rng & Units 08/29/2023    8:16 AM  PFT Results  FVC-Pre L 2.25   FVC-Predicted Pre % 72   FVC-Post L 2.71   FVC-Predicted Post % 87   Pre FEV1/FVC % % 72   Post FEV1/FCV % % 79   FEV1-Pre L 1.63   FEV1-Predicted Pre % 69   FEV1-Post L 2.15   DLCO uncorrected ml/min/mmHg 13.63   DLCO UNC% % 65   DLVA Predicted % 81   TLC L 4.79   TLC % Predicted % 86   RV % Predicted % 74     No results found for: NITRICOXIDE      No data to display              Assessment & Plan:   Assessment and Plan Assessment & Plan Asthma  -stable Asthma is well-managed with Breo and Singulair. Breo is beneficial, but insurance changes may affect coverage. Continue Breo and Singulair, rinse mouth after using Breo, and review insurance formulary for [employer], considering alternatives if necessary.  Obstructive sleep apnea  -controlled with perceived benefit  on CPAP  Wants to establish with our practice for Sleep management . CPAP download requested.  Obstructive sleep apnea is managed with consistent and beneficial CPAP therapy. Transfer of CPAP management to the current provider is in progress. Obtain CPAP download from [facility], transfer CPAP management to the current provider, and continue CPAP use nightly.  Migraine with chronic sinus congestion   Chronic sinus congestion contributes to migraines, possibly due to eustachian tube dysfunction and nasal dryness from CPAP use. . Current management includes over-the-counter mucus relief and Singulair. Use saline spray twice daily, saline gel at night, start low dose Claritin , use warm compresses and steam for sinus relief, continue Singulair.   Coronary artery disease, post-stent placement   Status post-stent placement with ongoing dual antiplatelet therapy (Plavix and aspirin)   Increased risk of bleeding due to medication regimen. No significant bleeding reported,   Cardiac rehabilitation is planned to improve endurance. Continue to monitor for signs of bleeding, including checking urine and stool, encourage cardiac rehabilitation and increased activity as tolerated, and follow up with cardiologist next Thursday.  Chronic kidney disease   Chronic kidney disease is well-managed. Recent kidney function tests are improving   Obesity   Obesity with recent weight gain prior to cardiac procedure. Weight is gradually decreasing post-procedure. Encourage increased activity as tolerated and participate in [employer] program.   Plan  Patient Instructions  Continue on Breo 1 puff daily, rinse after use.  Activity as tolerated.  Continue on CPAP At bedtime.  CPAP download. -[facility]  Activity as tolerated  Work on healthy weight .  Saline nasal rinse Twice daily   Saline nasal gel At bedtime   Begin Claritin  10mg  daily for 1 week and then As needed   Continue on Singulair 10mg  daily.  Follow up in 6 months and As needed   Please contact office for sooner follow up if symptoms do not improve or worsen or seek emergency care            Madelin Stank, NP 04/12/2024

## 2024-04-12 NOTE — Telephone Encounter (Signed)
 Received signed release of information from pt at [facility] on 04/12/24. Faxed to [facility] at [phone] to get a copy of pt's sleep study.

## 2024-04-12 NOTE — Telephone Encounter (Signed)
 Actually faxed to [phone] NOT [phone]

## 2024-04-12 NOTE — Telephone Encounter (Signed)
 Fax confirmation received. [facility]

## 2024-04-12 NOTE — Patient Instructions (Addendum)
 Continue on Breo 1 puff daily, rinse after use.  Activity as tolerated.  Continue on CPAP At bedtime.  CPAP download. -[facility]  Activity as tolerated  Work on healthy weight .  Saline nasal rinse Twice daily   Saline nasal gel At bedtime   Begin Claritin  10mg  daily for 1 week and then As needed   Continue on Singulair 10mg  daily.  Follow up in 6 months and As needed   Please contact office for sooner follow up if symptoms do not improve or worsen or seek emergency care

## 2024-04-13 ENCOUNTER — Other Ambulatory Visit: Payer: Self-pay | Admitting: Nurse Practitioner

## 2024-04-13 DIAGNOSIS — R079 Chest pain, unspecified: Secondary | ICD-10-CM

## 2024-04-18 ENCOUNTER — Encounter (HOSPITAL_COMMUNITY)
Admission: RE | Admit: 2024-04-18 | Discharge: 2024-04-18 | Disposition: A | Discharge status: Home / Self Care | Source: Ambulatory Visit | Attending: Cardiology | Admitting: Cardiology

## 2024-04-18 ENCOUNTER — Encounter: Payer: Self-pay | Admitting: Nurse Practitioner

## 2024-04-18 ENCOUNTER — Ambulatory Visit: Attending: Nurse Practitioner | Admitting: Nurse Practitioner

## 2024-04-18 ENCOUNTER — Encounter: Payer: Self-pay | Admitting: Nephrology

## 2024-04-18 VITALS — BP 142/81 | HR 82 | Ht 67.0 in | Wt 226.0 lb

## 2024-04-18 VITALS — Ht 66.0 in | Wt 225.8 lb

## 2024-04-18 DIAGNOSIS — N1832 Chronic kidney disease, stage 3b: Secondary | ICD-10-CM

## 2024-04-18 DIAGNOSIS — E7800 Pure hypercholesterolemia, unspecified: Secondary | ICD-10-CM | POA: Diagnosis not present

## 2024-04-18 DIAGNOSIS — R0789 Other chest pain: Secondary | ICD-10-CM

## 2024-04-18 DIAGNOSIS — Z955 Presence of coronary angioplasty implant and graft: Secondary | ICD-10-CM | POA: Insufficient documentation

## 2024-04-18 DIAGNOSIS — R0609 Other forms of dyspnea: Secondary | ICD-10-CM

## 2024-04-18 DIAGNOSIS — Z86711 Personal history of pulmonary embolism: Secondary | ICD-10-CM | POA: Diagnosis not present

## 2024-04-18 DIAGNOSIS — I25119 Atherosclerotic heart disease of native coronary artery with unspecified angina pectoris: Secondary | ICD-10-CM

## 2024-04-18 DIAGNOSIS — R519 Headache, unspecified: Secondary | ICD-10-CM

## 2024-04-18 DIAGNOSIS — R030 Elevated blood-pressure reading, without diagnosis of hypertension: Secondary | ICD-10-CM

## 2024-04-18 DIAGNOSIS — Z95820 Peripheral vascular angioplasty status with implants and grafts: Secondary | ICD-10-CM | POA: Insufficient documentation

## 2024-04-18 DIAGNOSIS — J45909 Unspecified asthma, uncomplicated: Secondary | ICD-10-CM

## 2024-04-18 MED ORDER — CLOPIDOGREL BISULFATE 75 MG PO TABS: 75.0000 mg | ORAL_TABLET | Freq: Every day | ORAL | 5 refills | Status: AC

## 2024-04-18 NOTE — Progress Notes (Signed)
 Cardiac Individual Treatment Plan  Patient Details  Name: Lauren Hamilton MRN: [id number] Date of Birth: Aug 18, 1945 Referring Provider:   Flowsheet Row CARDIAC REHAB PHASE II ORIENTATION from 04/18/2024 in [facility] C[facility]ILITATION  Referring Provider Ladona Heinz MD    Initial Encounter Date:  Flowsheet Row CARDIAC REHAB PHASE II ORIENTATION from 04/18/2024 in [place] [facility]  Date 04/18/24    Visit Diagnosis: Status post angioplasty with stent  Patient's Home Medications on Admission:  Current Outpatient Medications:    allopurinol  (ZYLOPRIM ) 300 MG tablet, Take 300 mg by mouth at bedtime., Disp: , Rfl:    apixaban  (ELIQUIS ) 5 MG TABS tablet, Take 1 tablet (5 mg total) by mouth 2 (two) times daily., Disp: , Rfl:    Artificial Tear Solution (SOOTHE XP) SOLN, Apply 1-2 drops to eye as needed (dry eyes). , Disp: , Rfl:    benzonatate (TESSALON) 200 MG capsule, Take 200 mg by mouth 3 (three) times daily as needed for cough., Disp: , Rfl:    BREO ELLIPTA  100-25 MCG/ACT AEPB, TAKE 1 PUFF BY MOUTH EVERY DAY, Disp: 60 each, Rfl: 5   clopidogrel (PLAVIX) 75 MG tablet, Take 1 tablet (75 mg total) by mouth daily., Disp: 30 tablet, Rfl: 5   dapagliflozin propanediol (FARXIGA) 5 MG TABS tablet, Take 5 mg by mouth every Monday, Wednesday, and Friday., Disp: , Rfl:    furosemide  (LASIX ) 20 MG tablet, Take 1-2 tablets (20-40 mg total) by mouth See admin instructions. Take 20 mg by mouth in the morning and 40 mg in the evening, Disp: , Rfl:    isosorbide  mononitrate (IMDUR ) 120 MG 24 hr tablet, Take 1 tablet (120 mg total) by mouth daily., Disp: 90 tablet, Rfl: 1   ketoconazole (NIZORAL) 2 % cream, Apply 1 Application topically daily as needed for irritation., Disp: , Rfl:    ketoconazole (NIZORAL) 2 % shampoo, Apply 1 Application topically daily as needed for irritation., Disp: , Rfl:    Lancets (ONETOUCH DELICA PLUS LANCET33G) MISC, SMARTSIG:11.9 Topical Daily, Disp: , Rfl:     MAGNESIUM  PO, Take 1 tablet by mouth daily., Disp: , Rfl:    metoprolol  succinate (TOPROL -XL) 25 MG 24 hr tablet, Take 25 mg by mouth daily., Disp: , Rfl:    montelukast (SINGULAIR) 10 MG tablet, Take 10 mg by mouth at bedtime., Disp: , Rfl:    Omega-3 Fatty Acids (FISH OIL PO), Take 1 capsule by mouth daily., Disp: , Rfl:    pantoprazole  (PROTONIX ) 40 MG tablet, Take 1 tablet (40 mg total) by mouth daily., Disp: 30 tablet, Rfl: 1   rizatriptan (MAXALT) 10 MG tablet, Take 10 mg by mouth daily as needed for migraine., Disp: , Rfl:    rosuvastatin  (CRESTOR ) 10 MG tablet, Take 1 tablet (10 mg total) by mouth every evening., Disp: 30 tablet, Rfl: 1   spironolactone  (ALDACTONE ) 25 MG tablet, Take 1 tablet (25 mg total) by mouth every Tuesday, Thursday, Saturday, and Sunday., Disp: , Rfl:    vitamin C (ASCORBIC ACID) 250 MG tablet, Take 250 mg by mouth daily., Disp: , Rfl:    vitamin E 200 UNIT capsule, Take 200 Units by mouth daily., Disp: , Rfl:   Past Medical History: 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 off anticoagulation   Bronchitis  Cancer (HCC)    Skin   Diabetes mellitus without complication (HCC)    Gout    H/O colonoscopy with polypectomy    Hypercholesteremia    Hypertension    Renal disorder    Renal insufficiency     Tobacco Use: Social History   Tobacco Use  Smoking Status Never   Passive exposure: Past  Smokeless Tobacco Never    Labs: Review Flowsheet       Latest Ref Rng & Units 06/16/2021 06/17/2021 11/03/2023 03/28/2024  Labs for ITP Cardiac and Pulmonary Rehab  Cholestrol <200 mg/dL - 899  858  -  LDL (calc) mg/dL (calc) - 42  70  -  HDL-C > OR = 50 mg/dL - 24  53  -  Trlycerides <150 mg/dL - 830  898  -  Hemoglobin A1c 4.8 - 5.6 % 8.1  - - -  PH, Arterial 7.35 - 7.45  - - - 7.398   PCO2 arterial 32 - 48 mmHg - - - 42.3   Bicarbonate 20.0 - 28.0 mmol/L - - - 25.7  26.1   TCO2 22 - 32 mmol/L - - - 27  27   O2 Saturation % - - - 72  84     Details       Multiple values from one day are sorted in reverse-chronological order          Exercise Target Goals: Exercise Program Goal: Individual exercise prescription set using results from initial 6 min walk test and THRR while considering  patient's activity barriers and safety.   Exercise Prescription Goal: Initial exercise prescription builds to 30-45 minutes a day of aerobic activity, 2-3 days per week.  Home exercise guidelines will be given to patient during program as part of exercise prescription that the participant will acknowledge.   Education: Aerobic Exercise: - Group verbal and visual presentation on the components of exercise prescription. Introduces F.I.T.T principle from ACSM for exercise prescriptions.  Reviews F.I.T.T. principles of aerobic exercise including progression. Written material provided at class time.   Education: Resistance Exercise: - Group verbal and visual presentation on the components of exercise prescription. Introduces F.I.T.T principle from ACSM for exercise prescriptions  Reviews F.I.T.T. principles of resistance exercise including progression. Written material provided at class time.    Education: Exercise & Equipment Safety: - Individual verbal instruction and demonstration of equipment use and safety with use of the equipment.   Education: Exercise Physiology & General Exercise Guidelines: - Group verbal and written instruction with models to review the exercise physiology of the cardiovascular system and associated critical values. Provides general exercise guidelines with specific guidelines to those with heart or lung disease. Written material provided at class time. Flowsheet Row CARDIAC VIRTUAL BASED CARE from 04/05/2024 in [place] [facility]  [facility]  Education need identified 04/05/24    Education: Flexibility, Balance, Mind/Body Relaxation: - Group verbal and visual presentation with interactive activity on the components of exercise prescription. Introduces F.I.T.T principle from ACSM for exercise prescriptions. Reviews F.I.T.T. principles of flexibility and balance exercise training including progression. Also discusses the mind body connection.  Reviews various relaxation techniques to help reduce and manage stress (i.e. Deep breathing, progressive muscle relaxation, and visualization). Balance handout provided to take home. Written material provided at class time.   Activity Barriers & Risk Stratification:  Activity Barriers & Cardiac Risk Stratification - 04/05/24 1112       Activity Barriers & Cardiac Risk Stratification   Activity Barriers Back Problems;Left Knee  Replacement;Right Knee Replacement;Chest Pain/Angina;Balance Concerns;Arthritis    Cardiac Risk Stratification High          6 Minute Walk:  6 Minute Walk     Row Name 04/18/24 1158         6 Minute Walk   Phase Initial     Distance 820 feet     Walk Time 6 minutes     # of Rest Breaks 0     MPH 1.55     METS 1.33     RPE 13     Perceived Dyspnea  1     VO2 Peak 4.66     Symptoms No     Resting HR 93 bpm     Resting BP 126/60     Resting Oxygen  Saturation  96 %     Exercise Oxygen  Saturation  during 6 min walk 94 %     Max Ex. HR 110 bpm     Max Ex. BP 140/70     2 Minute Post BP 128/70        Oxygen  Initial Assessment:   Oxygen  Re-Evaluation:   Oxygen  Discharge (Final Oxygen  Re-Evaluation):   Initial Exercise Prescription:  Initial Exercise Prescription - 04/18/24 1200       Date of Initial Exercise RX and Referring Provider   Date 04/18/24    Referring Provider Ladona Heinz MD      Treadmill   MPH 1.4    Grade 0    Minutes 15    METs 2.07      NuStep   Level 1    SPM 60    Minutes 15    METs 1.8       Prescription Details   Frequency (times per week) 2    Duration Progress to 30 minutes of continuous aerobic without signs/symptoms of physical distress      Intensity   THRR 40-80% of Max Heartrate 113-133    Ratings of Perceived Exertion 11-13    Perceived Dyspnea 0-4      Resistance Training   Training Prescription Yes    Weight 4    Reps 10-15          Perform Capillary Blood Glucose checks as needed.  Exercise Prescription Changes:   Exercise Prescription Changes     Row Name 04/18/24 1200             Response to Exercise   Blood Pressure (Admit) 126/60       Blood Pressure (Exercise) 140/70       Blood Pressure (Exit) 128/70       Heart Rate (Admit) 93 bpm       Heart Rate (Exercise) 110 bpm       Heart Rate (Exit) 90 bpm       Oxygen  Saturation (Admit) 96 %       Oxygen  Saturation (Exercise) 94 %       Oxygen  Saturation (Exit) 94 %       Rating of Perceived Exertion (Exercise) 13       Perceived Dyspnea (Exercise) 1          Exercise Comments:   Exercise Goals and Review:   Exercise Goals     Row Name 04/18/24 1202             Exercise Goals   Increase Physical Activity Yes       Intervention Provide advice, education, support and counseling about physical activity/exercise needs.;Develop an individualized exercise  prescription for aerobic and resistive training based on initial evaluation findings, risk stratification, comorbidities and participant's personal goals.       Expected Outcomes Short Term: Attend rehab on a regular basis to increase amount of physical activity.;Long Term: Add in home exercise to make exercise part of routine and to increase amount of physical activity.;Long Term: Exercising regularly at least 3-5 days a week.       Increase Strength and Stamina Yes       Intervention Provide advice, education, support and counseling about physical activity/exercise needs.;Develop an individualized exercise prescription for aerobic and  resistive training based on initial evaluation findings, risk stratification, comorbidities and participant's personal goals.       Expected Outcomes Short Term: Increase workloads from initial exercise prescription for resistance, speed, and METs.;Short Term: Perform resistance training exercises routinely during rehab and add in resistance training at home;Long Term: Improve cardiorespiratory fitness, muscular endurance and strength as measured by increased METs and functional capacity ( )       Able to understand and use rate of perceived exertion (RPE) scale Yes       Intervention Provide education and explanation on how to use RPE scale       Expected Outcomes Short Term: Able to use RPE daily in rehab to express subjective intensity level;Long Term:  Able to use RPE to guide intensity level when exercising independently       Able to understand and use Dyspnea scale Yes       Intervention Provide education and explanation on how to use Dyspnea scale       Expected Outcomes Short Term: Able to use Dyspnea scale daily in rehab to express subjective sense of shortness of breath during exertion;Long Term: Able to use Dyspnea scale to guide intensity level when exercising independently       Knowledge and understanding of Target Heart Rate Range (THRR) Yes       Intervention Provide education and explanation of THRR including how the numbers were predicted and where they are located for reference       Expected Outcomes Short Term: Able to state/look up THRR;Long Term: Able to use THRR to govern intensity when exercising independently;Short Term: Able to use daily as guideline for intensity in rehab       Able to check pulse independently Yes       Intervention Provide education and demonstration on how to check pulse in carotid and radial arteries.;Review the importance of being able to check your own pulse for safety during independent exercise       Expected Outcomes Short Term: Able to explain  why pulse checking is important during independent exercise;Long Term: Able to check pulse independently and accurately       Understanding of Exercise Prescription Yes       Intervention Provide education, explanation, and written materials on patient's individual exercise prescription       Expected Outcomes Short Term: Able to explain program exercise prescription;Long Term: Able to explain home exercise prescription to exercise independently          Exercise Goals Re-Evaluation :   Discharge Exercise Prescription (Final Exercise Prescription Changes):  Exercise Prescription Changes - 04/18/24 1200       Response to Exercise   Blood Pressure (Admit) 126/60    Blood Pressure (Exercise) 140/70    Blood Pressure (Exit) 128/70    Heart Rate (Admit) 93 bpm    Heart Rate (Exercise) 110 bpm  Heart Rate (Exit) 90 bpm    Oxygen  Saturation (Admit) 96 %    Oxygen  Saturation (Exercise) 94 %    Oxygen  Saturation (Exit) 94 %    Rating of Perceived Exertion (Exercise) 13    Perceived Dyspnea (Exercise) 1          Nutrition:  Target Goals: Understanding of nutrition guidelines, daily intake of sodium 1500mg , cholesterol 200mg , calories 30% from fat and 7% or less from saturated fats, daily to have 5 or more servings of fruits and vegetables.  Education: Nutrition 1 -Group instruction provided by verbal, written material, interactive activities, discussions, models, and posters to present general guidelines for heart healthy nutrition including macronutrients, label reading, and promoting whole foods over processed counterparts. Education serves as pensions consultant of discussion of heart healthy eating for all. Written material provided at class time. Flowsheet Row CARDIAC VIRTUAL BASED CARE from 04/05/2024 in [place] [facility]  Education need identified 04/05/24     Education: Nutrition 2 -Group instruction provided by verbal, written material, interactive activities,  discussions, models, and posters to present general guidelines for heart healthy nutrition including sodium, cholesterol, and saturated fat. Providing guidance of habit forming to improve blood pressure, cholesterol, and body weight. Written material provided at class time. Flowsheet Row CARDIAC VIRTUAL BASED CARE from 04/05/2024 in [place] [facility]  Education need identified 04/05/24      Biometrics:  Pre Biometrics - 04/18/24 1203       Pre Biometrics   Height 5' 6 (1.676 m)    Weight 102.4 kg    Waist Circumference 41 inches    Hip Circumference 52 inches    Waist to Hip Ratio 0.79 %    BMI (Calculated) 36.45    Single Leg Stand 0 seconds           Nutrition Therapy Plan and Nutrition Goals:   Nutrition Assessments:  MEDIFICTS Score Key: >=70 Need to make dietary changes  40-70 Heart Healthy Diet <= 40 Therapeutic Level Cholesterol Diet   Picture Your Plate Scores: <59 Unhealthy dietary pattern with much room for improvement. 41-50 Dietary pattern unlikely to meet recommendations for good health and room for improvement. 51-60 More healthful dietary pattern, with some room for improvement.  >60 Healthy dietary pattern, although there may be some specific behaviors that could be improved.    Nutrition Goals Re-Evaluation:   Nutrition Goals Discharge (Final Nutrition Goals Re-Evaluation):   Psychosocial: Target Goals: Acknowledge presence or absence of significant depression and/or stress, maximize coping skills, provide positive support system. Participant is able to verbalize types and ability to use techniques and skills needed for reducing stress and depression.   Education: Stress, Anxiety, and Depression - Group verbal and visual presentation to define topics covered.  Reviews how body is impacted by stress, anxiety, and depression.  Also discusses healthy ways to reduce stress and to treat/manage anxiety and depression. Written material  provided at class time.   Education: Sleep Hygiene -Provides group verbal and written instruction about how sleep can affect your health.  Define sleep hygiene, discuss sleep cycles and impact of sleep habits. Review good sleep hygiene tips.   Initial Review & Psychosocial Screening:  Initial Psych Review & Screening - 04/05/24 1127       Initial Review   Current issues with None Identified      Family Dynamics   Good Support System? Yes    Comments Patient's daughter supports her.      Barriers  Psychosocial barriers to participate in program The patient should benefit from training in stress management and relaxation.;There are no identifiable barriers or psychosocial needs.      Screening Interventions   Interventions To provide support and resources with identified psychosocial needs;Encouraged to exercise;Provide feedback about the scores to participant    Expected Outcomes Short Term goal: Utilizing psychosocial counselor, staff and physician to assist with identification of specific Stressors or current issues interfering with healing process. Setting desired goal for each stressor or current issue identified.;Long Term Goal: Stressors or current issues are controlled or eliminated.;Short Term goal: Identification and review with participant of any Quality of Life or Depression concerns found by scoring the questionnaire.;Long Term goal: The participant improves quality of Life and PHQ9 Scores as seen by post scores and/or verbalization of changes          Quality of Life Scores:   Quality of Life - 04/05/24 0850       Quality of Life   Select Quality of Life      Quality of Life Scores   Health/Function Pre 20.73 %    Socioeconomic Pre 25.31 %    Psych/Spiritual Pre 28.21 %    Family Pre 27 %    GLOBAL Pre 24.17 %         Scores of 19 and below usually indicate a poorer quality of life in these areas.  A difference of  2-3 points is a clinically meaningful  difference.  A difference of 2-3 points in the total score of the Quality of Life Index has been associated with significant improvement in overall quality of life, self-image, physical symptoms, and general health in studies assessing change in quality of life.  PHQ-9: Review Flowsheet       04/18/2024  Depression screen PHQ 2/9  Decreased Interest 3  Down, Depressed, Hopeless 0  PHQ - 2 Score 3  Altered sleeping 1  Tired, decreased energy 2  Change in appetite 0  Feeling bad or failure about yourself  0  Trouble concentrating 1  Moving slowly or fidgety/restless 0  Suicidal thoughts 0  PHQ-9 Score 7  Difficult doing work/chores Not difficult at all   Interpretation of Total Score  Total Score Depression Severity:  1-4 = Minimal depression, 5-9 = Mild depression, 10-14 = Moderate depression, 15-19 = Moderately severe depression, 20-27 = Severe depression   Psychosocial Evaluation and Intervention:  Psychosocial Evaluation - 04/05/24 1128       Psychosocial Evaluation & Interventions   Interventions Relaxation education;Stress management education;Encouraged to exercise with the program and follow exercise prescription    Comments Patient was referred to cardiac rehab with angioplasty with stent placement. She denies any depression or anxiety. She says she sleeps okay. She lives with her daughter. She says she continues to have chest pain that she was having prior to her stent. She has follow up with [facility]6 and due to chest pain, we are moving her orientation visist to after this follow up OV. Patient goes to silver sneakers at the [facility] but can not return until her follow up. 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 She is not sure she will be able to participate but wants to try. Her OA pain may be a barrier. Her goals for the program are to get back to silver sneakers and learn about a healthy lifestyle.    Expected Outcomes Short Term: Patient will start the  program and attend consistently. Long  Term: Patient will complete the program meeting personal goals.    Continue Psychosocial Services  Follow up required by staff          Psychosocial Re-Evaluation:   Psychosocial Discharge (Final Psychosocial Re-Evaluation):   Vocational Rehabilitation: Provide vocational rehab assistance to qualifying candidates.   Vocational Rehab Evaluation & Intervention:  Vocational Rehab - 04/05/24 1127       Initial Vocational Rehab Evaluation & Intervention   Assessment shows need for Vocational Rehabilitation No      Vocational Rehab Re-Evaulation   Comments Patient is retired.          Education: Education Goals: Education classes will be provided on a variety of topics geared toward better understanding of heart health and risk factor modification. Participant will state understanding/return demonstration of topics presented as noted by education test scores.  Learning Barriers/Preferences:   General Cardiac Education Topics:  AED/CPR: - Group verbal and written instruction with the use of models to demonstrate the basic use of the AED with the basic ABC's of resuscitation.   Test and Procedures: - Group verbal and visual presentation and models provide information about basic cardiac anatomy and function. Reviews the testing methods done to diagnose heart disease and the outcomes of the test results. Describes the treatment choices: Medical Management, Angioplasty, or Coronary Bypass Surgery for treating various heart conditions including Myocardial Infarction, Angina, Valve Disease, and Cardiac Arrhythmias. Written material provided at class time.   Medication Safety: - Group verbal and visual instruction to review commonly prescribed medications for heart and lung disease. Reviews the medication, class of the drug, and side effects. Includes the steps to properly store meds and maintain the prescription regimen. Written material  provided at class time.   Intimacy: - Group verbal instruction through game format to discuss how heart and lung disease can affect sexual intimacy. Written material provided at class time.   Know Your Numbers and Heart Failure: - Group verbal and visual instruction to discuss disease risk factors for cardiac and pulmonary disease and treatment options.  Reviews associated critical values for Overweight/Obesity, Hypertension, Cholesterol, and Diabetes.  Discusses basics of heart failure: signs/symptoms and treatments.  Introduces Heart Failure Zone chart for action plan for heart failure. Written material provided at class time. Flowsheet Row CARDIAC VIRTUAL BASED CARE from 04/05/2024 in [place] [facility]  Education need identified 04/05/24    Infection Prevention: - Provides verbal and written material to individual with discussion of infection control including proper hand washing and proper equipment cleaning during exercise session.   Falls Prevention: - Provides verbal and written material to individual with discussion of falls prevention and safety.   Other: -Provides group and verbal instruction on various topics (see comments)   Knowledge Questionnaire Score:  Knowledge Questionnaire Score - 04/05/24 0851       Knowledge Questionnaire Score   Pre Score 17/26          Core Components/Risk Factors/Patient Goals at Admission:  Personal Goals and Risk Factors at Admission - 04/05/24 1127       Core Components/Risk Factors/Patient Goals on Admission    Weight Management Obesity    Diabetes Yes    Intervention Provide education about signs/symptoms and action to take for hypo/hyperglycemia.;Provide education about proper nutrition, including hydration, and aerobic/resistive exercise prescription along with prescribed medications to achieve blood glucose in normal ranges: Fasting glucose 65-99 mg/dL    Expected Outcomes Short Term: Participant verbalizes  understanding of the signs/symptoms and immediate care  of hyper/hypoglycemia, proper foot care and importance of medication, aerobic/resistive exercise and nutrition plan for blood glucose control.;Long Term: Attainment of HbA1C < 7%.    Hypertension Yes    Intervention Provide education on lifestyle modifcations including regular physical activity/exercise, weight management, moderate sodium restriction and increased consumption of fresh fruit, vegetables, and low fat dairy, alcohol  moderation, and smoking cessation.;Monitor prescription use compliance.    Expected Outcomes Short Term: Continued assessment and intervention until BP is < 140/32mm HG in hypertensive participants. < 130/38mm HG in hypertensive participants with diabetes, heart failure or chronic kidney disease.    Lipids Yes    Intervention Provide education and support for participant on nutrition & aerobic/resistive exercise along with prescribed medications to achieve LDL 70mg , HDL >40mg .    Expected Outcomes Short Term: Participant states understanding of desired cholesterol values and is compliant with medications prescribed. Participant is following exercise prescription and nutrition guidelines.;Long Term: Cholesterol controlled with medications as prescribed, with individualized exercise RX and with personalized nutrition plan. Value goals: LDL < 70mg , HDL > 40 mg.          Education:Diabetes - Individual verbal and written instruction to review signs/symptoms of diabetes, desired ranges of glucose level fasting, after meals and with exercise. Acknowledge that pre and post exercise glucose checks will be done for 3 sessions at entry of program.   Core Components/Risk Factors/Patient Goals Review:    Core Components/Risk Factors/Patient Goals at Discharge (Final Review):    ITP Comments:  ITP Comments     Row Name 04/05/24 1139 04/18/24 1127         ITP Comments Virtual orientation visit completed for cardiac rehab  with angioplasty with stent placement. On-site orientation visit scheduled for 04/18/24 at 10:30 pending clearance from Almarie Crate, NP. Patient arrived for 1st visit/orientation/education at 1030. Patient was referred to CR by Dr. Alexandria. Alvan attending due to PTCA with stent. During orientation advised patient on arrival and appointment times what to wear, what to do before, during and after exercise. Reviewed attendance and class policy.  Pt is scheduled to return Cardiac Rehab on 04/23/24 at 10:30. Pt was advised to come to class 15 minutes before class starts.  Discussed RPE/Dpysnea scales. Patient participated in warm up stretches. Patient was able to complete 6 minute walk test.  Telemetry:NSR Patient was measured for the equipment. Discussed equipment safety with patient. Took patient pre-anthropometric measurements. Patient finished visit at 1127.         Comments: Patient arrived for 1st visit/orientation/education at 1030. Patient was referred to CR by Dr. Alexandria. Alvan attending due to PTCA with stent. During orientation advised patient on arrival and appointment times what to wear, what to do before, during and after exercise. Reviewed attendance and class policy.  Pt is scheduled to return Cardiac Rehab on 04/23/24 at 10:30. Pt was advised to come to class 15 minutes before class starts.  Discussed RPE/Dpysnea scales. Patient participated in warm up stretches. Patient was able to complete 6 minute walk test.  Telemetry:NSR Patient was measured for the equipment. Discussed equipment safety with patient. Took patient pre-anthropometric measurements. Patient finished visit at 1127.

## 2024-04-18 NOTE — Patient Instructions (Addendum)
 Medication Instructions:  Your physician has recommended you make the following change in your medication:   -Stop Aspirin   *If you need a refill on your cardiac medications before your next appointment, please call your pharmacy*  Lab Work: Before your next visit:  -Fasting Lipid Panel -LFT  If you have labs (blood work) drawn today and your tests are completely normal, you will receive your results only by: MyChart Message (if you have MyChart) OR A paper copy in the mail If you have any lab test that is abnormal or we need to change your treatment, we will call you to review the results.  Testing/Procedures: None  Follow-Up: At [facility], you and your health needs are our priority.  As part of our continuing mission to provide you with exceptional heart care, our providers are all part of one team.  This team includes your primary Cardiologist (physician) and Advanced Practice Providers or APPs (Physician Assistants and Nurse Practitioners) who all work together to provide you with the care you need, when you need it.  Your next appointment:   06/07/2024  Provider:   Dr. Dorn Ross      We recommend signing up for the patient portal called MyChart.  Sign up information is provided on this After Visit Summary.  MyChart is used to connect with patients for Virtual Visits (Telemedicine).  Patients are able to view lab/test results, encounter notes, upcoming appointments, etc.  Non-urgent messages can be sent to your provider as well.   To learn more about what you can do with MyChart, go to [email or web address].   Other Instructions

## 2024-04-18 NOTE — Patient Instructions (Signed)
 Patient Instructions  Patient Details  Name: Lauren Hamilton MRN: [id number] Date of Birth: 05/25/1946 Referring Provider:  Ladona Heinz, MD  Below are your personal goals for exercise, nutrition, and risk factors. Our goal is to help you stay on track towards obtaining and maintaining these goals. We will be discussing your progress on these goals with you throughout the program.  Initial Exercise Prescription:  Initial Exercise Prescription - 04/18/24 1200       Date of Initial Exercise RX and Referring Provider   Date 04/18/24    Referring Provider Ladona Heinz MD      Treadmill   MPH 1.4    Grade 0    Minutes 15    METs 2.07      NuStep   Level 1    SPM 60    Minutes 15    METs 1.8      Prescription Details   Frequency (times per week) 2    Duration Progress to 30 minutes of continuous aerobic without signs/symptoms of physical distress      Intensity   THRR 40-80% of Max Heartrate 113-133    Ratings of Perceived Exertion 11-13    Perceived Dyspnea 0-4      Resistance Training   Training Prescription Yes    Weight 4    Reps 10-15          Exercise Goals: Frequency: Be able to perform aerobic exercise two to three times per week in program working toward 2-5 days per week of home exercise.  Intensity: Work with a perceived exertion of 11 (fairly light) - 15 (hard) while following your exercise prescription.  We will make changes to your prescription with you as you progress through the program.   Duration: Be able to do 30 to 45 minutes of continuous aerobic exercise in addition to a 5 minute warm-up and a 5 minute cool-down routine.   Nutrition Goals: Your personal nutrition goals will be established when you do your nutrition analysis with the dietician.  The following are general nutrition guidelines to follow: Cholesterol < 200mg /day Sodium < 1500mg /day Fiber: Women over 50 yrs - 21 grams per day  Personal Goals:  Personal Goals and Risk Factors at  Admission - 04/05/24 1127       Core Components/Risk Factors/Patient Goals on Admission    Weight Management Obesity    Diabetes Yes    Intervention Provide education about signs/symptoms and action to take for hypo/hyperglycemia.;Provide education about proper nutrition, including hydration, and aerobic/resistive exercise prescription along with prescribed medications to achieve blood glucose in normal ranges: Fasting glucose 65-99 mg/dL    Expected Outcomes Short Term: Participant verbalizes understanding of the signs/symptoms and immediate care of hyper/hypoglycemia, proper foot care and importance of medication, aerobic/resistive exercise and nutrition plan for blood glucose control.;Long Term: Attainment of HbA1C < 7%.    Hypertension Yes    Intervention Provide education on lifestyle modifcations including regular physical activity/exercise, weight management, moderate sodium restriction and increased consumption of fresh fruit, vegetables, and low fat dairy, alcohol  moderation, and smoking cessation.;Monitor prescription use compliance.    Expected Outcomes Short Term: Continued assessment and intervention until BP is < 140/4mm HG in hypertensive participants. < 130/32mm HG in hypertensive participants with diabetes, heart failure or chronic kidney disease.    Lipids Yes    Intervention Provide education and support for participant on nutrition & aerobic/resistive exercise along with prescribed medications to achieve LDL 70mg , HDL >40mg .    Expected  Outcomes Short Term: Participant states understanding of desired cholesterol values and is compliant with medications prescribed. Participant is following exercise prescription and nutrition guidelines.;Long Term: Cholesterol controlled with medications as prescribed, with individualized exercise RX and with personalized nutrition plan. Value goals: LDL < 70mg , HDL > 40 mg.          Tobacco Use Initial Evaluation: Social History   Tobacco  Use  Smoking Status Never   Passive exposure: Past  Smokeless Tobacco Never    Exercise Goals and Review:  Exercise Goals     Row Name 04/18/24 1202             Exercise Goals   Increase Physical Activity Yes       Intervention Provide advice, education, support and counseling about physical activity/exercise needs.;Develop an individualized exercise prescription for aerobic and resistive training based on initial evaluation findings, risk stratification, comorbidities and participant's personal goals.       Expected Outcomes Short Term: Attend rehab on a regular basis to increase amount of physical activity.;Long Term: Add in home exercise to make exercise part of routine and to increase amount of physical activity.;Long Term: Exercising regularly at least 3-5 days a week.       Increase Strength and Stamina Yes       Intervention Provide advice, education, support and counseling about physical activity/exercise needs.;Develop an individualized exercise prescription for aerobic and resistive training based on initial evaluation findings, risk stratification, comorbidities and participant's personal goals.       Expected Outcomes Short Term: Increase workloads from initial exercise prescription for resistance, speed, and METs.;Short Term: Perform resistance training exercises routinely during rehab and add in resistance training at home;Long Term: Improve cardiorespiratory fitness, muscular endurance and strength as measured by increased METs and functional capacity ( )       Able to understand and use rate of perceived exertion (RPE) scale Yes       Intervention Provide education and explanation on how to use RPE scale       Expected Outcomes Short Term: Able to use RPE daily in rehab to express subjective intensity level;Long Term:  Able to use RPE to guide intensity level when exercising independently       Able to understand and use Dyspnea scale Yes       Intervention Provide  education and explanation on how to use Dyspnea scale       Expected Outcomes Short Term: Able to use Dyspnea scale daily in rehab to express subjective sense of shortness of breath during exertion;Long Term: Able to use Dyspnea scale to guide intensity level when exercising independently       Knowledge and understanding of Target Heart Rate Range (THRR) Yes       Intervention Provide education and explanation of THRR including how the numbers were predicted and where they are located for reference       Expected Outcomes Short Term: Able to state/look up THRR;Long Term: Able to use THRR to govern intensity when exercising independently;Short Term: Able to use daily as guideline for intensity in rehab       Able to check pulse independently Yes       Intervention Provide education and demonstration on how to check pulse in carotid and radial arteries.;Review the importance of being able to check your own pulse for safety during independent exercise       Expected Outcomes Short Term: Able to explain why pulse checking is important during independent exercise;Long  Term: Able to check pulse independently and accurately       Understanding of Exercise Prescription Yes       Intervention Provide education, explanation, and written materials on patient's individual exercise prescription       Expected Outcomes Short Term: Able to explain program exercise prescription;Long Term: Able to explain home exercise prescription to exercise independently          Copy of goals given to participant.Patient Instructions  Patient Details  Name: Lauren Hamilton MRN: [id number] Date of Birth: 1946-01-07 Referring Provider:  Ladona Heinz, MD  Below are your personal goals for exercise, nutrition, and risk factors. Our goal is to help you stay on track towards obtaining and maintaining these goals. We will be discussing your progress on these goals with you throughout the program.  Initial Exercise  Prescription:   Exercise Goals: Frequency: Be able to perform aerobic exercise two to three times per week in program working toward 2-5 days per week of home exercise.  Intensity: Work with a perceived exertion of 11 (fairly light) - 15 (hard) while following your exercise prescription.  We will make changes to your prescription with you as you progress through the program.   Duration: Be able to do 30 to 45 minutes of continuous aerobic exercise in addition to a 5 minute warm-up and a 5 minute cool-down routine.   Nutrition Goals: Your personal nutrition goals will be established when you do your nutrition analysis with the dietician.  The following are general nutrition guidelines to follow: Cholesterol < 200mg /day Sodium < 1500mg /day Fiber: Women over 50 yrs - 21 grams per day  Personal Goals:  Personal Goals and Risk Factors at Admission - 04/05/24 1127       Core Components/Risk Factors/Patient Goals on Admission    Weight Management Obesity    Diabetes Yes    Intervention Provide education about signs/symptoms and action to take for hypo/hyperglycemia.;Provide education about proper nutrition, including hydration, and aerobic/resistive exercise prescription along with prescribed medications to achieve blood glucose in normal ranges: Fasting glucose 65-99 mg/dL    Expected Outcomes Short Term: Participant verbalizes understanding of the signs/symptoms and immediate care of hyper/hypoglycemia, proper foot care and importance of medication, aerobic/resistive exercise and nutrition plan for blood glucose control.;Long Term: Attainment of HbA1C < 7%.    Hypertension Yes    Intervention Provide education on lifestyle modifcations including regular physical activity/exercise, weight management, moderate sodium restriction and increased consumption of fresh fruit, vegetables, and low fat dairy, alcohol  moderation, and smoking cessation.;Monitor prescription use compliance.    Expected  Outcomes Short Term: Continued assessment and intervention until BP is < 140/63mm HG in hypertensive participants. < 130/58mm HG in hypertensive participants with diabetes, heart failure or chronic kidney disease.    Lipids Yes    Intervention Provide education and support for participant on nutrition & aerobic/resistive exercise along with prescribed medications to achieve LDL 70mg , HDL >40mg .    Expected Outcomes Short Term: Participant states understanding of desired cholesterol values and is compliant with medications prescribed. Participant is following exercise prescription and nutrition guidelines.;Long Term: Cholesterol controlled with medications as prescribed, with individualized exercise RX and with personalized nutrition plan. Value goals: LDL < 70mg , HDL > 40 mg.           Exercise Goals and Review:   Copy of goals given to participant.

## 2024-04-18 NOTE — Progress Notes (Addendum)
 Cardiology Office Note:  .   Date: 04/18/2024 ID:  Lauren Hamilton, DOB 06-19-1945, MRN [id number] PCP: Maree Isles, MD   [facility] Providers Cardiologist:  Alvan Carrier, MD    History of Present Illness: .   Lauren Hamilton is a 78 y.o. female with a PMH of CAD, chest pain, hypertension, type 2 diabetes, history of bilateral PE, hypercholesterolemia, leg edema, and CKD, who presents today for post cardiac cath follow-up. Followed by Dr. Darlean and Dr. Rachele.   Last seen by Dr. Carrier Alvan on April 28, 2023.  She added noticed progressing dyspnea on exertion and chest pains over the past month and a half at the time.  Her chest pain was noted to be somewhat atypical but was noted to have an exertional component.  Lexiscan  suggested areas of peri-infarct ischemia, low to intermediate risk.  Imdur  15 mg daily was added to medication regimen, was recommended to titrate Imdur  over time.  Due to her CKD D with GFR in the 30s, was noted to have very high threshold to consider cardiac catheterization.  08/01/2023 - Today she presents for 46-month follow-up.  She states her shortness of breath remains the same, not able to be as active at [employer] as she previously was. Continues to go to exercise around 3 times per week. CC is her shortness of breath with exertion. CP is better per her report. Now located more to left lateral anterior side of chest, denies any radiation or alleviating/aggravating factors. Described as a twinge, typically noted 2-3 times per day, not associated with exertion. 2-3 on 0-10 pain scale. Denies any palpitations, syncope, presyncope, dizziness, orthopnea, PND, swelling or significant weight changes, acute bleeding, or claudication.  11/03/2023 - Here for follow-up. Does admit to shortness of breath, says her pulmonology provider believes she has asthma and will be undergoing a CT scan of her chest soon.  Does admit to occasional twinges of chest pain.   Described as like a mild pulled muscle and does not occur with activity and is on the left lateral side of her chest.  She says it is frustrating she is unable to do what she wants.  During exertion, she says her arms feel heavy.  This episode occurred day before yesterday and also all day yesterday.  Describes as mild sensation. Denies any palpitations, syncope, presyncope, dizziness, orthopnea, PND, swelling or significant weight changes, acute bleeding, or claudication.  01/09/2024 -she is here for follow-up.  Tells me medication change since last office visit has not made a difference with her symptoms despite self increasing her Imdur  to 90 mg daily from 60 mg daily.  She still has some chest pain that is left-sided, sometimes noticed during exertion such as folding laundry and sometimes also at rest, still notices some shortness of breath, denies any specific pattern or triggers.  Tells me she is retaining more fluid, weight is up about 12 pounds she says and notices some leg edema.  She also reports some sensation of hot flashes. Denies any palpitations, syncope, presyncope, dizziness, orthopnea, PND, acute bleeding, or claudication.  02/29/2024 -  Here for follow-up. Continues to admit to DOE and sensation of what feels like left upper chest/shoulder pain despite increasing Imdur  (can't tell a difference since increasing medication), episodes are taking longer to subside. Denies any palpitations, syncope, presyncope, dizziness, orthopnea, PND, swelling or significant weight changes, acute bleeding, or claudication. Follows pulmonology for diagnosis of Asthma.   She underwent right and left heart cath  on 03/28/2024, under IVUS guided PCI to proximal RCA with drug-eluting stent, circumflex had CTO in mid segment of origin of small OM1 branch, there were bridging collaterals.  Recommended to treat with aspirin for 2 to 4 weeks, Plavix for 6 months and to resume Eliquis  the next day.  Recommended to treat  CTO of left circumflex medically.   Today she is here for post cardiac cath follow-up.  She admits to issues with easy bruising, as well as bleeding due to hemorrhoids, this has resolved per her report.  Continues to admit to atypical chest pain noted to left upper chest, this has improved since heart catheterization and rates this on a scale of 2-4 on a 0-10 pain scale, difficult for her to describe in detail.  She wonders if this is the fact that her Prilosec was switched to pantoprazole  at discharge from the hospital.  She says Prilosec has worked well for her in the past.  Also admits to issues with sinus migraine, has been treated for this by another provider. Denies any shortness of breath, palpitations, syncope, presyncope, dizziness, orthopnea, PND, swelling or significant weight changes, acute bleeding, or claudication.   ROS: Negative. See HPI.   Studies Reviewed: SABRA    EKG: EKG is not ordered today.   Right and left heart cath 03/28/2024:  Cardiac Catheterization 03/28/24: Hemodynamic data: RA 14/12, mean 12 mmHg. RV 41/6, EDP 12 mmHg. PA 39/21, mean 30 mmHg. PW 18/16, mean 15 mmHg. PA saturation 70%, AO saturation 84%. QP/QS 1.0.  CO 9.07, CI 4.29 by Fick. PVR 1.65.   LV 131/5, EDP 18 mmHg.  Ao 123/69, mean 92 mmHg.  No pressure gradient across the aortic valve.   Angiographic data: RCA: Very large caliber vessel and dominant vessel, moderate heavily calcified mid segment stenosis of 80%.  Gives origin to 2 moderate-sized PDA and a very large PL branch.  Distal RCA is diffusely diseased with PL branch having a 40% tandem stenosis. LM: Mildly calcified but widely patent. LCx: Moderate caliber vessel giving origin to a very large OM 3 with a secondary branch.  Cx is CTO in the midsegment of the origin of small OM1, there are bridging collaterals.  TIMI III flow is evident in the marginal. LAD: It is a moderate caliber vessel, mildly diffusely diseased and mildly calcified.  Gives  origin to small diagonals.   Intervention data: IVUS guided PCI to proximal RCA with implantation of a 3.5 x 20 mm Synergy XD DES at 16 atmospheric pressure, stenosis reduced from 80% to 0%, MLA 2.2 mm prestent, 6.6 mm post stent.       Impression and recommendations: Aspirin for 2 to 4 weeks, Plavix for 6 months, resume Eliquis  tomorrow.  CTO CX can be treated medically with brisk flow there is evident on that she has symptomatic angina.   Lexiscan  04/2023:    Findings are consistent with prior inferior/inferolatera/inferoapical infarction with mild to moderate peri-infarct ischemia. Low to intermediate risk study   No ST deviation was noted.   LV perfusion is abnormal. Moderate size mild to moderate intensity inferior/inferolateral/inferoapical defect with mild to moderate reversibility.   Left ventricular function is normal. Nuclear stress EF: 73%. The left ventricular ejection fraction is hyperdynamic (>65%). End diastolic cavity size is normal.  Echo 03/2023:  1. Left ventricular ejection fraction, by estimation, is 60 to 65%. The  left ventricle has normal function. The left ventricle has no regional  wall motion abnormalities. There is mild left ventricular hypertrophy.  Left ventricular diastolic parameters  are consistent with Grade I diastolic dysfunction (impaired relaxation).   2. Right ventricular systolic function is normal. The right ventricular  size is normal. There is normal pulmonary artery systolic pressure. The  estimated right ventricular systolic pressure is 26.6 mmHg.   3. Left atrial size was mildly dilated.   4. The mitral valve is degenerative. Trivial mitral valve regurgitation.  No evidence of mitral stenosis.   5. The aortic valve is tricuspid. There is mild calcification of the  aortic valve. Aortic valve regurgitation is not visualized. Aortic valve  sclerosis/calcification is present, without any evidence of aortic  stenosis.   6. The inferior vena  cava is normal in size with greater than 50%  respiratory variability, suggesting right atrial pressure of 3 mmHg.   Physical Exam:   VS:  BP (!) 142/81 (BP Location: Left Arm, Cuff Size: Large)   Pulse 82   Ht 5' 7 (1.702 m)   Wt 226 lb (102.5 kg)   SpO2 97%   BMI 35.40 kg/m    Wt Readings from Last 3 Encounters:  04/18/24 226 lb (102.5 kg)  04/12/24 222 lb 9.6 oz (101 kg)  03/28/24 223 lb (101.2 kg)    Repeat BP on exam: 142/82  GEN: Obese, 78 y.o. female in no acute distress NECK: No JVD; No carotid bruits CARDIAC: S1/S2, RRR, no murmurs, rubs, gallops RESPIRATORY:  Clear to auscultation without rales, wheezing or rhonchi  ABDOMEN: Soft, non-tender, non-distended EXTREMITIES:  No edema to BLE; No deformity   ASSESSMENT AND PLAN: .    Atypical chest pain, CAD, hypercholesterolemia, elevated blood pressure Etiology atypical. Improved from cardiac cath, but some etiology points to possible GI relation as her PPI was recently switched. No other medication changes at this time. Care and ED precautions discussed.  Will stop aspirin and will refill her Plavix.  No other medication changes at this time.  Will consult clinical Pharm.D. regarding her PPI. No complications post cath, will request most recent labs obtained by Dr. Juanito office last week. Recommended cardiac rehab as long as she is symptom free. Care and ED precautions discussed. SBP slightly elevated in office today, home readings are at goal. Recommended to monitor BP at home at least 2 hours after medications and sitting for 5-10 minutes. Will obtain FLP/LFT prior to next office visit. If no improvement with BP and symptoms by next office visit, recommend possibly increasing Imdur .   Addendum: Already addressed patient's heartburn medication. See most recent MyChart message sent today.   Asthma, hx of DOE Denies any recent symptoms. Most recent echo revealed normal LVEF, no worrisome findings.  Has been diagnosed with  asthma by her pulmonology NP.    No other medication changes besides what is noted above. Care and ED precautions discussed.  Hx of PE Denies any concerning signs of symptoms. Continue Eliquis  5 mg BID. Continue to follow with PCP.   CKD stage 3b Will request most recent labs from her Nephrologist. Avoid nephrotoxic agents. Continue to follow with PCP and Nephrology.   5. Sinus headache Denies any red flag signs or symptoms. Continue to follow-up with PCP who is managing this.    Dispo: Care and ED precautions discussed. Follow-up with MD/APP as scheduled or sooner if anything changes.   Signed, Almarie Crate, NP

## 2024-04-23 ENCOUNTER — Encounter (HOSPITAL_COMMUNITY)
Admission: RE | Admit: 2024-04-23 | Discharge: 2024-04-23 | Disposition: A | Source: Ambulatory Visit | Attending: Cardiology

## 2024-04-23 DIAGNOSIS — Z95820 Peripheral vascular angioplasty status with implants and grafts: Secondary | ICD-10-CM

## 2024-04-23 DIAGNOSIS — Z955 Presence of coronary angioplasty implant and graft: Secondary | ICD-10-CM | POA: Diagnosis not present

## 2024-04-23 LAB — GLUCOSE, CAPILLARY: Glucose-Capillary: 136 mg/dL — ABNORMAL HIGH (ref 70–99)

## 2024-04-23 NOTE — Progress Notes (Signed)
 Daily Session Note  Patient Details  Name: Lauren Hamilton MRN: [id number] Date of Birth: 01-02-1946 Referring Provider:   Flowsheet Row CARDIAC REHAB PHASE II ORIENTATION from 04/18/2024 in [facility] CARDIAC REH[facility]IL[facility]ATION  Referring Provider Ladona Heinz MD    Encounter Date: 04/23/2024  Check In:  Session Check In - 04/23/24 1030       Check-In   Supervising physician immediately available to respond to emergencies See telemetry face sheet for immediately available MD    Location [facility]    Staff Present Powell Benders, BS, Exercise Physiologist;Brittany Jackquline, BSN, RN, WTA-C;Ann-Marie Kluge Zina, RN    Virtual Visit No    Medication changes reported     No    Fall or balance concerns reported    No    Warm-up and Cool-down Performed on first and last piece of equipment    Resistance Training Performed Yes    VAD Patient? No    PAD/SET Patient? No      Pain Assessment   Currently in Pain? No/denies          Capillary Blood Glucose: Results for orders placed or performed during the hospital encounter of 04/23/24 (from the past 24 hours)  Glucose, capillary     Status: Abnormal   Collection Time: 04/23/24 10:25 AM  Result Value Ref Range   Glucose-Capillary 136 (H) 70 - 99 mg/dL      Social History   Tobacco Use  Smoking Status Never   Passive exposure: Past  Smokeless Tobacco Never    Goals Met:  Independence with exercise equipment Exercise tolerated well No report of concerns or symptoms today Strength training completed today  Goals Unmet:  Not Applicable  Comments: First full day of exercise!  Patient was oriented to gym and equipment including functions, settings, policies, and procedures.  Patient's individual exercise prescription and treatment plan were reviewed.  All starting workloads were established based on the results of the 6 minute walk test done at initial orientation visit.  The plan for exercise progression was also  introduced and progression will be customized based on patient's performance and goals.

## 2024-04-25 ENCOUNTER — Encounter (HOSPITAL_COMMUNITY)
Admission: RE | Admit: 2024-04-25 | Discharge: 2024-04-25 | Disposition: A | Discharge status: Home / Self Care | Source: Ambulatory Visit | Attending: Cardiology | Admitting: Cardiology

## 2024-04-25 DIAGNOSIS — B351 Tinea unguium: Secondary | ICD-10-CM | POA: Diagnosis not present

## 2024-04-25 DIAGNOSIS — L84 Corns and callosities: Secondary | ICD-10-CM | POA: Diagnosis not present

## 2024-04-25 DIAGNOSIS — M79675 Pain in left toe(s): Secondary | ICD-10-CM | POA: Diagnosis not present

## 2024-04-25 DIAGNOSIS — M79674 Pain in right toe(s): Secondary | ICD-10-CM | POA: Diagnosis not present

## 2024-04-25 DIAGNOSIS — Z95820 Peripheral vascular angioplasty status with implants and grafts: Secondary | ICD-10-CM

## 2024-04-25 DIAGNOSIS — Z955 Presence of coronary angioplasty implant and graft: Secondary | ICD-10-CM | POA: Diagnosis not present

## 2024-04-25 DIAGNOSIS — E1142 Type 2 diabetes mellitus with diabetic polyneuropathy: Secondary | ICD-10-CM | POA: Diagnosis not present

## 2024-04-25 LAB — GLUCOSE, CAPILLARY
Glucose-Capillary: 120 mg/dL — ABNORMAL HIGH (ref 70–99)
Glucose-Capillary: 119 mg/dL — ABNORMAL HIGH (ref 70–99)

## 2024-04-25 NOTE — Progress Notes (Signed)
 Daily Session Note  Patient Details  Name: Lauren Hamilton MRN: [id number] Date of Birth: [birth year] Referring Provider:   Flowsheet Row CARDIAC REHAB PHASE II ORIENTATION from 04/18/2024 in [facility] CARDIAC REHABILITATION  Referring Provider Ladona Heinz MD    Encounter Date: 04/25/2024  Check In:  Session Check In - 04/25/24 1037       Check-In   Supervising physician immediately available to respond to emergencies See telemetry face sheet for immediately available MD    Location [facility]    Staff Present Powell Benders, BS, Exercise Physiologist;Hillary Dean BSN, RN    Virtual Visit No    Medication changes reported     No    Fall or balance concerns reported    No    Tobacco Cessation No Change    Warm-up and Cool-down Performed on first and last piece of equipment    Resistance Training Performed Yes    VAD Patient? No    PAD/SET Patient? No      Pain Assessment   Currently in Pain? No/denies    Pain Score 0-No pain    Multiple Pain Sites No          Capillary Blood Glucose: Results for orders placed or performed during the hospital encounter of 04/25/24 (from the past 24 hours)  Glucose, capillary     Status: Abnormal   Collection Time: 04/25/24 10:21 AM  Result Value Ref Range   Glucose-Capillary 120 (H) 70 - 99 mg/dL      Social History   Tobacco Use  Smoking Status Never   Passive exposure: Past  Smokeless Tobacco Never    Goals Met:  Independence with exercise equipment Exercise tolerated well No report of concerns or symptoms today Strength training completed today  Goals Unmet:  Not Applicable  Comments: Pt able to follow exercise prescription today without complaint.  Will continue to monitor for progression.

## 2024-04-25 NOTE — Telephone Encounter (Signed)
 Received sleep study via fax. After being reviewed by [facility], one copy was made. The original was placed into the scan folder in B Pod, and the copy was placed in the file folder in Tammy Parrett's cabinet in B Pod until the original has been scanned in.

## 2024-04-30 ENCOUNTER — Encounter (HOSPITAL_COMMUNITY)
Admission: RE | Admit: 2024-04-30 | Discharge: 2024-04-30 | Disposition: A | Discharge status: Home / Self Care | Source: Ambulatory Visit | Attending: Cardiology | Admitting: Cardiology

## 2024-04-30 DIAGNOSIS — Z955 Presence of coronary angioplasty implant and graft: Secondary | ICD-10-CM | POA: Diagnosis not present

## 2024-04-30 DIAGNOSIS — Z95820 Peripheral vascular angioplasty status with implants and grafts: Secondary | ICD-10-CM

## 2024-04-30 NOTE — Progress Notes (Signed)
 Daily Session Note  Patient Details  Name: Lauren Hamilton MRN: [id number] Date of Birth: [birth year] Referring Provider:   Flowsheet Row CARDIAC REHAB PHASE II ORIENTATION from 04/18/2024 in [facility] CARDIAC REHABILITATION  Referring Provider Ladona Heinz MD    Encounter Date: 04/30/2024  Check In:  Session Check In - 04/30/24 1026       Check-In   Supervising physician immediately available to respond to emergencies See telemetry face sheet for immediately available MD    Location [facility]    Staff Present Powell Benders, BS, Exercise Physiologist;Brittany Jackquline, BSN, RN, WTA-C;Aeneas Longsworth Zina, RN    Virtual Visit No    Medication changes reported     No    Fall or balance concerns reported    No    Warm-up and Cool-down Performed on first and last piece of equipment    Resistance Training Performed Yes    VAD Patient? No    PAD/SET Patient? No      Pain Assessment   Currently in Pain? No/denies          Capillary Blood Glucose: No results found for this or any previous visit (from the past 24 hours).    Social History   Tobacco Use  Smoking Status Never   Passive exposure: Past  Smokeless Tobacco Never    Goals Met:  Independence with exercise equipment Exercise tolerated well No report of concerns or symptoms today Strength training completed today  Goals Unmet:  Not Applicable  Comments: Pt able to follow exercise prescription today without complaint.  Will continue to monitor for progression.

## 2024-05-02 ENCOUNTER — Encounter (HOSPITAL_COMMUNITY)

## 2024-05-03 ENCOUNTER — Encounter: Payer: Self-pay | Admitting: Neurology

## 2024-05-07 ENCOUNTER — Encounter (HOSPITAL_COMMUNITY)
Admission: RE | Admit: 2024-05-07 | Discharge: 2024-05-07 | Disposition: A | Discharge status: Home / Self Care | Source: Ambulatory Visit | Attending: Cardiology | Admitting: Cardiology

## 2024-05-07 DIAGNOSIS — I10 Essential (primary) hypertension: Secondary | ICD-10-CM | POA: Diagnosis not present

## 2024-05-07 DIAGNOSIS — E7800 Pure hypercholesterolemia, unspecified: Secondary | ICD-10-CM | POA: Diagnosis not present

## 2024-05-07 DIAGNOSIS — Z299 Encounter for prophylactic measures, unspecified: Secondary | ICD-10-CM | POA: Diagnosis not present

## 2024-05-07 DIAGNOSIS — M5431 Sciatica, right side: Secondary | ICD-10-CM | POA: Diagnosis not present

## 2024-05-07 DIAGNOSIS — Z955 Presence of coronary angioplasty implant and graft: Secondary | ICD-10-CM | POA: Diagnosis not present

## 2024-05-07 DIAGNOSIS — R5383 Other fatigue: Secondary | ICD-10-CM | POA: Diagnosis not present

## 2024-05-07 DIAGNOSIS — Z7189 Other specified counseling: Secondary | ICD-10-CM | POA: Diagnosis not present

## 2024-05-07 DIAGNOSIS — Z0000 Encounter for general adult medical examination without abnormal findings: Secondary | ICD-10-CM | POA: Diagnosis not present

## 2024-05-07 DIAGNOSIS — Z95820 Peripheral vascular angioplasty status with implants and grafts: Secondary | ICD-10-CM

## 2024-05-07 DIAGNOSIS — R52 Pain, unspecified: Secondary | ICD-10-CM | POA: Diagnosis not present

## 2024-05-07 DIAGNOSIS — Z1331 Encounter for screening for depression: Secondary | ICD-10-CM | POA: Diagnosis not present

## 2024-05-07 NOTE — Progress Notes (Signed)
 Daily Session Note  Patient Details  Name: Lauren Hamilton MRN: [id number] Date of Birth: [birth year] Referring Provider:   Flowsheet Row CARDIAC REHAB PHASE II ORIENTATION from 04/18/2024 in [facility] CARDIAC REHABILITATION  Referring Provider Ladona Heinz MD    Encounter Date: 05/07/2024  Check In:  Session Check In - 05/07/24 1027       Check-In   Supervising physician immediately available to respond to emergencies See telemetry face sheet for immediately available MD    Location [facility]    Staff Present Powell Benders, BS, Exercise Physiologist;Brittany Jackquline, BSN, RN, WTA-C;Jhonatan Lomeli Zina, RN    Virtual Visit No    Medication changes reported     No    Fall or balance concerns reported    No    Warm-up and Cool-down Performed on first and last piece of equipment    Resistance Training Performed Yes    VAD Patient? No    PAD/SET Patient? No      Pain Assessment   Currently in Pain? Yes    Pain Score 8     Pain Location Back    Pain Descriptors / Indicators Aching    Multiple Pain Sites No          Capillary Blood Glucose: No results found for this or any previous visit (from the past 24 hours).    Social History   Tobacco Use  Smoking Status Never   Passive exposure: Past  Smokeless Tobacco Never    Goals Met:  Independence with exercise equipment Exercise tolerated well No report of concerns or symptoms today Strength training completed today  Goals Unmet:  Not Applicable  Comments: Pt able to follow exercise prescription today without complaint.  Will continue to monitor for progression.

## 2024-05-14 ENCOUNTER — Encounter (HOSPITAL_COMMUNITY)
Admission: RE | Admit: 2024-05-14 | Discharge: 2024-05-14 | Disposition: A | Discharge status: Home / Self Care | Source: Ambulatory Visit | Attending: Cardiology | Admitting: Cardiology

## 2024-05-14 ENCOUNTER — Encounter (HOSPITAL_COMMUNITY): Payer: Self-pay | Admitting: *Deleted

## 2024-05-14 DIAGNOSIS — Z95820 Peripheral vascular angioplasty status with implants and grafts: Secondary | ICD-10-CM

## 2024-05-14 DIAGNOSIS — Z9889 Other specified postprocedural states: Secondary | ICD-10-CM | POA: Diagnosis present

## 2024-05-14 DIAGNOSIS — Z48812 Encounter for surgical aftercare following surgery on the circulatory system: Secondary | ICD-10-CM | POA: Insufficient documentation

## 2024-05-14 DIAGNOSIS — Z955 Presence of coronary angioplasty implant and graft: Secondary | ICD-10-CM | POA: Diagnosis not present

## 2024-05-14 NOTE — Progress Notes (Signed)
 Cardiac Individual Treatment Plan  Patient Details  Name: Lauren Hamilton MRN: [id number] Date of Birth: 1945-07-31 Referring Provider:   Flowsheet Row CARDIAC REHAB PHASE II ORIENTATION from 04/18/2024 in [facility] CARDIAC REHABILITATION  Referring Provider Ladona Heinz MD    Initial Encounter Date:  Flowsheet Row CARDIAC REHAB PHASE II ORIENTATION from 04/18/2024 in [place] [facility] CARDIAC REHABILITATION  Date 04/18/24    Visit Diagnosis: Status post angioplasty with stent  Patient's Home Medications on Admission:  Current Outpatient Medications:    allopurinol  (ZYLOPRIM ) 300 MG tablet, Take 300 mg by mouth at bedtime., Disp: , Rfl:    apixaban  (ELIQUIS ) 5 MG TABS tablet, Take 1 tablet (5 mg total) by mouth 2 (two) times daily., Disp: , Rfl:    Artificial Tear Solution (SOOTHE XP) SOLN, Apply 1-2 drops to eye as needed (dry eyes). , Disp: , Rfl:    benzonatate (TESSALON) 200 MG capsule, Take 200 mg by mouth 3 (three) times daily as needed for cough., Disp: , Rfl:    BREO ELLIPTA  100-25 MCG/ACT AEPB, TAKE 1 PUFF BY MOUTH EVERY DAY, Disp: 60 each, Rfl: 5   clopidogrel  (PLAVIX ) 75 MG tablet, Take 1 tablet (75 mg total) by mouth daily., Disp: 30 tablet, Rfl: 5   dapagliflozin propanediol (FARXIGA) 5 MG TABS tablet, Take 5 mg by mouth every Monday, Wednesday, and Friday., Disp: , Rfl:    furosemide  (LASIX ) 20 MG tablet, Take 1-2 tablets (20-40 mg total) by mouth See admin instructions. Take 20 mg by mouth in the morning and 40 mg in the evening, Disp: , Rfl:    isosorbide  mononitrate (IMDUR ) 120 MG 24 hr tablet, Take 1 tablet (120 mg total) by mouth daily., Disp: 90 tablet, Rfl: 1   ketoconazole (NIZORAL) 2 % cream, Apply 1 Application topically daily as needed for irritation., Disp: , Rfl:    ketoconazole (NIZORAL) 2 % shampoo, Apply 1 Application topically daily as needed for irritation., Disp: , Rfl:    Lancets (ONETOUCH DELICA PLUS LANCET33G) MISC, SMARTSIG:11.9 Topical Daily, Disp: , Rfl:     MAGNESIUM  PO, Take 1 tablet by mouth daily., Disp: , Rfl:    metoprolol  succinate (TOPROL -XL) 25 MG 24 hr tablet, Take 25 mg by mouth daily., Disp: , Rfl:    montelukast (SINGULAIR) 10 MG tablet, Take 10 mg by mouth at bedtime., Disp: , Rfl:    Omega-3 Fatty Acids (FISH OIL PO), Take 1 capsule by mouth daily., Disp: , Rfl:    pantoprazole  (PROTONIX ) 40 MG tablet, Take 1 tablet (40 mg total) by mouth daily., Disp: 30 tablet, Rfl: 1   rizatriptan (MAXALT) 10 MG tablet, Take 10 mg by mouth daily as needed for migraine., Disp: , Rfl:    rosuvastatin  (CRESTOR ) 10 MG tablet, Take 1 tablet (10 mg total) by mouth every evening., Disp: 30 tablet, Rfl: 1   spironolactone  (ALDACTONE ) 25 MG tablet, Take 1 tablet (25 mg total) by mouth every Tuesday, Thursday, Saturday, and Sunday., Disp: , Rfl:    vitamin C (ASCORBIC ACID) 250 MG tablet, Take 250 mg by mouth daily., Disp: , Rfl:    vitamin E 200 UNIT capsule, Take 200 Units by mouth daily., Disp: , Rfl:   Past Medical History: 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 off anticoagulation   Bronchitis  Cancer (HCC)    Skin   Diabetes mellitus without complication (HCC)    Gout    H/O colonoscopy with polypectomy    Hypercholesteremia    Hypertension    Renal disorder    Renal insufficiency     Tobacco Use: Social History   Tobacco Use  Smoking Status Never   Passive exposure: Past  Smokeless Tobacco Never    Labs: Review Flowsheet       Latest Ref Rng & Units 06/16/2021 06/17/2021 11/03/2023 03/28/2024  Labs for ITP Cardiac and Pulmonary Rehab  Cholestrol <200 mg/dL - 899  858  -  LDL (calc) mg/dL (calc) - 42  70  -  HDL-C > OR = 50 mg/dL - 24  53  -  Trlycerides <150 mg/dL - 830  898  -  Hemoglobin A1c 4.8 - 5.6 % 8.1  - - -  PH, Arterial 7.35 - 7.45  - - - 7.398   PCO2 arterial 32 - 48 mmHg - - - 42.3   Bicarbonate 20.0 - 28.0 mmol/L - - - 25.7  26.1   TCO2 22 - 32 mmol/L - - - 27  27   O2 Saturation % - - - 72  84     Details       Multiple values from one day are sorted in reverse-chronological order         Capillary Blood Glucose: Lab Results  Component Value Date   GLUCAP 119 (H) 04/25/2024   GLUCAP 120 (H) 04/25/2024   GLUCAP 136 (H) 04/23/2024   GLUCAP 98 03/28/2024   GLUCAP 108 (H) 03/28/2024     Exercise Target Goals: Exercise Program Goal: Individual exercise prescription set using results from initial 6 min walk test and THRR while considering  patient's activity barriers and safety.   Exercise Prescription Goal: Starting with aerobic activity 30 plus minutes a day, 3 days per week for initial exercise prescription. Provide home exercise prescription and guidelines that participant acknowledges understanding prior to discharge.  Activity Barriers & Risk Stratification:  Activity Barriers & Cardiac Risk Stratification - 04/05/24 1112       Activity Barriers & Cardiac Risk Stratification   Activity Barriers Back Problems;Left Knee Replacement;Right Knee Replacement;Chest Pain/Angina;Balance Concerns;Arthritis    Cardiac Risk Stratification High          6 Minute Walk:  6 Minute Walk     Row Name 04/18/24 1158         6 Minute Walk   Phase Initial     Distance 820 feet     Walk Time 6 minutes     # of Rest Breaks 0     MPH 1.55     METS 1.33     RPE 13     Perceived Dyspnea  1     VO2 Peak 4.66     Symptoms No     Resting HR 93 bpm     Resting BP 126/60     Resting Oxygen  Saturation  96 %     Exercise Oxygen  Saturation  during 6 min walk 94 %     Max Ex. HR 110 bpm     Max Ex. BP 140/70     2 Minute Post BP 128/70        Oxygen  Initial Assessment:   Oxygen  Re-Evaluation:   Oxygen  Discharge (Final Oxygen  Re-Evaluation):   Initial Exercise Prescription:  Initial Exercise  Prescription - 04/18/24 1200  Date of Initial Exercise RX and Referring Provider   Date 04/18/24    Referring Provider Ladona Heinz MD      Treadmill   MPH 1.4    Grade 0    Minutes 15    METs 2.07      NuStep   Level 1    SPM 60    Minutes 15    METs 1.8      Prescription Details   Frequency (times per week) 2    Duration Progress to 30 minutes of continuous aerobic without signs/symptoms of physical distress      Intensity   THRR 40-80% of Max Heartrate 113-133    Ratings of Perceived Exertion 11-13    Perceived Dyspnea 0-4      Resistance Training   Training Prescription Yes    Weight 4    Reps 10-15          Perform Capillary Blood Glucose checks as needed.  Exercise Prescription Changes:   Exercise Prescription Changes     Row Name 04/18/24 1200             Response to Exercise   Blood Pressure (Admit) 126/60       Blood Pressure (Exercise) 140/70       Blood Pressure (Exit) 128/70       Heart Rate (Admit) 93 bpm       Heart Rate (Exercise) 110 bpm       Heart Rate (Exit) 90 bpm       Oxygen  Saturation (Admit) 96 %       Oxygen  Saturation (Exercise) 94 %       Oxygen  Saturation (Exit) 94 %       Rating of Perceived Exertion (Exercise) 13       Perceived Dyspnea (Exercise) 1          Exercise Comments:   Exercise Comments     Row Name 04/23/24 1030           Exercise Comments First full day of exercise!  Patient was oriented to gym and equipment including functions, settings, policies, and procedures.  Patient's individual exercise prescription and treatment plan were reviewed.  All starting workloads were established based on the results of the 6 minute walk test done at initial orientation visit.  The plan for exercise progression was also introduced and progression will be customized based on patient's performance and goals.          Exercise Goals and Review:   Exercise Goals     Row Name 04/18/24 1202              Exercise Goals   Increase Physical Activity Yes       Intervention Provide advice, education, support and counseling about physical activity/exercise needs.;Develop an individualized exercise prescription for aerobic and resistive training based on initial evaluation findings, risk stratification, comorbidities and participant's personal goals.       Expected Outcomes Short Term: Attend rehab on a regular basis to increase amount of physical activity.;Long Term: Add in home exercise to make exercise part of routine and to increase amount of physical activity.;Long Term: Exercising regularly at least 3-5 days a week.       Increase Strength and Stamina Yes       Intervention Provide advice, education, support and counseling about physical activity/exercise needs.;Develop an individualized exercise prescription for aerobic and resistive training based on initial evaluation findings, risk stratification, comorbidities and participant's personal goals.  Expected Outcomes Short Term: Increase workloads from initial exercise prescription for resistance, speed, and METs.;Short Term: Perform resistance training exercises routinely during rehab and add in resistance training at home;Long Term: Improve cardiorespiratory fitness, muscular endurance and strength as measured by increased METs and functional capacity ( )       Able to understand and use rate of perceived exertion (RPE) scale Yes       Intervention Provide education and explanation on how to use RPE scale       Expected Outcomes Short Term: Able to use RPE daily in rehab to express subjective intensity level;Long Term:  Able to use RPE to guide intensity level when exercising independently       Able to understand and use Dyspnea scale Yes       Intervention Provide education and explanation on how to use Dyspnea scale       Expected Outcomes Short Term: Able to use Dyspnea scale daily in rehab to express subjective sense of shortness of breath  during exertion;Long Term: Able to use Dyspnea scale to guide intensity level when exercising independently       Knowledge and understanding of Target Heart Rate Range (THRR) Yes       Intervention Provide education and explanation of THRR including how the numbers were predicted and where they are located for reference       Expected Outcomes Short Term: Able to state/look up THRR;Long Term: Able to use THRR to govern intensity when exercising independently;Short Term: Able to use daily as guideline for intensity in rehab       Able to check pulse independently Yes       Intervention Provide education and demonstration on how to check pulse in carotid and radial arteries.;Review the importance of being able to check your own pulse for safety during independent exercise       Expected Outcomes Short Term: Able to explain why pulse checking is important during independent exercise;Long Term: Able to check pulse independently and accurately       Understanding of Exercise Prescription Yes       Intervention Provide education, explanation, and written materials on patient's individual exercise prescription       Expected Outcomes Short Term: Able to explain program exercise prescription;Long Term: Able to explain home exercise prescription to exercise independently          Exercise Goals Re-Evaluation :  Exercise Goals Re-Evaluation     Row Name 04/30/24 1049             Exercise Goal Re-Evaluation   Exercise Goals Review Increase Physical Activity;Increase Strength and Stamina;Understanding of Exercise Prescription       Comments Lauren Hamilton is doing well in rehab. She stated that she does not like exerciisng at all but is trying to get to it. She is exercising at [employer] with [employer] 3xs a week plus in rehab 2 days week.       Expected Outcomes Short: continue to attend rehab program   long: continue to exercise           Discharge Exercise Prescription (Final Exercise Prescription  Changes):  Exercise Prescription Changes - 04/18/24 1200       Response to Exercise   Blood Pressure (Admit) 126/60    Blood Pressure (Exercise) 140/70    Blood Pressure (Exit) 128/70    Heart Rate (Admit) 93 bpm    Heart Rate (Exercise) 110 bpm    Heart Rate (Exit) 90 bpm  Oxygen  Saturation (Admit) 96 %    Oxygen  Saturation (Exercise) 94 %    Oxygen  Saturation (Exit) 94 %    Rating of Perceived Exertion (Exercise) 13    Perceived Dyspnea (Exercise) 1          Nutrition:  Target Goals: Understanding of nutrition guidelines, daily intake of sodium 1500mg , cholesterol 200mg , calories 30% from fat and 7% or less from saturated fats, daily to have 5 or more servings of fruits and vegetables.  Biometrics:  Pre Biometrics - 04/18/24 1203       Pre Biometrics   Height 5' 6 (1.676 m)    Weight 225 lb 12 oz (102.4 kg)    Waist Circumference 41 inches    Hip Circumference 52 inches    Waist to Hip Ratio 0.79 %    BMI (Calculated) 36.45    Single Leg Stand 0 seconds           Nutrition Therapy Plan and Nutrition Goals:   Nutrition Assessments:  MEDIFICTS Score Key: >=70 Need to make dietary changes  40-70 Heart Healthy Diet <= 40 Therapeutic Level Cholesterol Diet  Flowsheet Row CARDIAC REHAB PHASE II EXERCISE from 04/23/2024 in [facility] CARDIAC REHABILITATION  Picture Your Plate Total Score on Admission 63   Picture Your Plate Scores: <59 Unhealthy dietary pattern with much room for improvement. 41-50 Dietary pattern unlikely to meet recommendations for good health and room for improvement. 51-60 More healthful dietary pattern, with some room for improvement.  >60 Healthy dietary pattern, although there may be some specific behaviors that could be improved.    Nutrition Goals Re-Evaluation:  Nutrition Goals Re-Evaluation     Row Name 04/30/24 1053             Goals   Nutrition Goal healthy eating       Comment Lauren Hamilton is trying to eat healthier.  She lives with her daughter and her family so she eats what they cook. They have been trying to cook healthier options. She will do chicken taco salad when they make tacos. She has been watching what she eats in moderation. She drinks mostly waters with about 2-3 sodas a week. She is a swwet person and loves choloclate but she has been trying to cut back. and eat in moderation       Expected Outcome Short: continue to watch sweets   long: contiune to pick healthy optiions          Nutrition Goals Discharge (Final Nutrition Goals Re-Evaluation):  Nutrition Goals Re-Evaluation - 04/30/24 1053       Goals   Nutrition Goal healthy eating    Comment Lauren Hamilton is trying to eat healthier. She lives with her daughter and her family so she eats what they cook. They have been trying to cook healthier options. She will do chicken taco salad when they make tacos. She has been watching what she eats in moderation. She drinks mostly waters with about 2-3 sodas a week. She is a swwet person and loves choloclate but she has been trying to cut back. and eat in moderation    Expected Outcome Short: continue to watch sweets   long: contiune to pick healthy optiions          Psychosocial: Target Goals: Acknowledge presence or absence of significant depression and/or stress, maximize coping skills, provide positive support system. Participant is able to verbalize types and ability to use techniques and skills needed for reducing stress and depression.  Initial  Review & Psychosocial Screening:  Initial Psych Review & Screening - 04/05/24 1127       Initial Review   Current issues with None Identified      Family Dynamics   Good Support System? Yes    Comments Patient's daughter supports her.      Barriers   Psychosocial barriers to participate in program The patient should benefit from training in stress management and relaxation.;There are no identifiable barriers or psychosocial needs.      Screening  Interventions   Interventions To provide support and resources with identified psychosocial needs;Encouraged to exercise;Provide feedback about the scores to participant    Expected Outcomes Short Term goal: Utilizing psychosocial counselor, staff and physician to assist with identification of specific Stressors or current issues interfering with healing process. Setting desired goal for each stressor or current issue identified.;Long Term Goal: Stressors or current issues are controlled or eliminated.;Short Term goal: Identification and review with participant of any Quality of Life or Depression concerns found by scoring the questionnaire.;Long Term goal: The participant improves quality of Life and PHQ9 Scores as seen by post scores and/or verbalization of changes          Quality of Life Scores:  Quality of Life - 04/05/24 0850       Quality of Life   Select Quality of Life      Quality of Life Scores   Health/Function Pre 20.73 %    Socioeconomic Pre 25.31 %    Psych/Spiritual Pre 28.21 %    Family Pre 27 %    GLOBAL Pre 24.17 %         Scores of 19 and below usually indicate a poorer quality of life in these areas.  A difference of  2-3 points is a clinically meaningful difference.  A difference of 2-3 points in the total score of the Quality of Life Index has been associated with significant improvement in overall quality of life, self-image, physical symptoms, and general health in studies assessing change in quality of life.  PHQ-9: Review Flowsheet       04/18/2024  Depression screen PHQ 2/9  Decreased Interest 3  Down, Depressed, Hopeless 0  PHQ - 2 Score 3  Altered sleeping 1  Tired, decreased energy 2  Change in appetite 0  Feeling bad or failure about yourself  0  Trouble concentrating 1  Moving slowly or fidgety/restless 0  Suicidal thoughts 0  PHQ-9 Score 7  Difficult doing work/chores Not difficult at all   Interpretation of Total Score  Total Score  Depression Severity:  1-4 = Minimal depression, 5-9 = Mild depression, 10-14 = Moderate depression, 15-19 = Moderately severe depression, 20-27 = Severe depression   Psychosocial Evaluation and Intervention:  Psychosocial Evaluation - 04/05/24 1128       Psychosocial Evaluation & Interventions   Interventions Relaxation education;Stress management education;Encouraged to exercise with the program and follow exercise prescription    Comments Patient was referred to cardiac rehab with angioplasty with stent placement. She denies any depression or anxiety. She says she sleeps okay. She lives with her daughter. She says she continues to have chest pain that she was having prior to her stent. She has follow up with [facility] 11/6 and due to chest pain, we are moving her orientation visist to after this follow up OV. Patient goes to silver sneakers at the [facility] but can not return until her follow up. 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 She is not sure she will be able to participate but wants to try. Her OA pain may be a barrier. Her goals for the program are to get back to silver sneakers and learn about a healthy lifestyle.    Expected Outcomes Short Term: Patient will start the program and attend consistently. Long Term: Patient will complete the program meeting personal goals.    Continue Psychosocial Services  Follow up required by staff          Psychosocial Re-Evaluation:  Psychosocial Re-Evaluation     Row Name 04/30/24 1051             Psychosocial Re-Evaluation   Current issues with None Identified       Comments Lauren Hamilton is doing well oin rehab. She has not sleep issues. she uses her CPAP and has been doing well. She also does not have any major stressors in her life. She stated that its life and has everyday small stressors that do not bother her.       Expected Outcomes Short: continue to use CPAP for good sleep  long: exercise to help woth overall wellbeing        Interventions Encouraged to attend Cardiac Rehabilitation for the exercise       Continue Psychosocial Services  Follow up required by staff          Psychosocial Discharge (Final Psychosocial Re-Evaluation):  Psychosocial Re-Evaluation - 04/30/24 1051       Psychosocial Re-Evaluation   Current issues with None Identified    Comments Lauren Hamilton is doing well oin rehab. She has not sleep issues. she uses her CPAP and has been doing well. She also does not have any major stressors in her life. She stated that its life and has everyday small stressors that do not bother her.    Expected Outcomes Short: continue to use CPAP for good sleep  long: exercise to help woth overall wellbeing    Interventions Encouraged to attend Cardiac Rehabilitation for the exercise    Continue Psychosocial Services  Follow up required by staff          Vocational Rehabilitation: Provide vocational rehab assistance to qualifying candidates.   Vocational Rehab Evaluation & Intervention:  Vocational Rehab - 04/05/24 1127       Initial Vocational Rehab Evaluation & Intervention   Assessment shows need for Vocational Rehabilitation No      Vocational Rehab Re-Evaulation   Comments Patient is retired.          Education: Education Goals: Education classes will be provided on a weekly basis, covering required topics. Participant will state understanding/return demonstration of topics presented.  Learning Barriers/Preferences:   Education Topics: Hypertension, Hypertension Reduction -Define heart disease and high blood pressure. Discus how high blood pressure affects the body and ways to reduce high blood pressure.   Exercise and Your Heart -Discuss why it is important to exercise, the FITT principles of exercise, normal and abnormal responses to exercise, and how to exercise safely.   Angina -Discuss definition of angina, causes of angina, treatment of angina, and how to decrease risk of having  angina.   Cardiac Medications -Review what the following cardiac medications are used for, how they affect the body, and side effects that may occur when taking the medications.  Medications include Aspirin , Beta blockers, calcium  channel blockers, ACE Inhibitors, angiotensin receptor blockers, diuretics, digoxin, and antihyperlipidemics.   Congestive Heart Failure -Discuss the definition of CHF, how to live with CHF,  the signs and symptoms of CHF, and how keep track of weight and sodium intake.   Heart Disease and Intimacy -Discus the effect sexual activity has on the heart, how changes occur during intimacy as we age, and safety during sexual activity.   Smoking Cessation / COPD -Discuss different methods to quit smoking, the health benefits of quitting smoking, and the definition of COPD.   Nutrition I: Fats -Discuss the types of cholesterol, what cholesterol does to the heart, and how cholesterol levels can be controlled.   Nutrition II: Labels -Discuss the different components of food labels and how to read food label   Heart Parts/Heart Disease and PAD -Discuss the anatomy of the heart, the pathway of blood circulation through the heart, and these are affected by heart disease.   Stress I: Signs and Symptoms -Discuss the causes of stress, how stress may lead to anxiety and depression, and ways to limit stress.   Stress II: Relaxation -Discuss different types of relaxation techniques to limit stress.   Warning Signs of Stroke / TIA -Discuss definition of a stroke, what the signs and symptoms are of a stroke, and how to identify when someone is having stroke.   Knowledge Questionnaire Score:  Knowledge Questionnaire Score - 04/05/24 0851       Knowledge Questionnaire Score   Pre Score 17/26          Core Components/Risk Factors/Patient Goals at Admission:  Personal Goals and Risk Factors at Admission - 04/05/24 1127       Core Components/Risk  Factors/Patient Goals on Admission    Weight Management Obesity    Diabetes Yes    Intervention Provide education about signs/symptoms and action to take for hypo/hyperglycemia.;Provide education about proper nutrition, including hydration, and aerobic/resistive exercise prescription along with prescribed medications to achieve blood glucose in normal ranges: Fasting glucose 65-99 mg/dL    Expected Outcomes Short Term: Participant verbalizes understanding of the signs/symptoms and immediate care of hyper/hypoglycemia, proper foot care and importance of medication, aerobic/resistive exercise and nutrition plan for blood glucose control.;Long Term: Attainment of HbA1C < 7%.    Hypertension Yes    Intervention Provide education on lifestyle modifcations including regular physical activity/exercise, weight management, moderate sodium restriction and increased consumption of fresh fruit, vegetables, and low fat dairy, alcohol  moderation, and smoking cessation.;Monitor prescription use compliance.    Expected Outcomes Short Term: Continued assessment and intervention until BP is < 140/44mm HG in hypertensive participants. < 130/63mm HG in hypertensive participants with diabetes, heart failure or chronic kidney disease.    Lipids Yes    Intervention Provide education and support for participant on nutrition & aerobic/resistive exercise along with prescribed medications to achieve LDL 70mg , HDL >40mg .    Expected Outcomes Short Term: Participant states understanding of desired cholesterol values and is compliant with medications prescribed. Participant is following exercise prescription and nutrition guidelines.;Long Term: Cholesterol controlled with medications as prescribed, with individualized exercise RX and with personalized nutrition plan. Value goals: LDL < 70mg , HDL > 40 mg.          Core Components/Risk Factors/Patient Goals Review:   Goals and Risk Factor Review     Row Name 04/30/24 1056              Core Components/Risk Factors/Patient Goals Review   Personal Goals Review Weight Management/Obesity;Hypertension       Review Lauren Hamilton is doing well in rehab. She has been watching her food options and picking healthier options. She has been  monitoing her BP and numbers have been WNL at rehab and at home. She takes her medications and has had no bad side effects to them.       Expected Outcomes Short: continue to attend rehab    long: continue to monitor BP and take medications          Core Components/Risk Factors/Patient Goals at Discharge (Final Review):   Goals and Risk Factor Review - 04/30/24 1056       Core Components/Risk Factors/Patient Goals Review   Personal Goals Review Weight Management/Obesity;Hypertension    Review Yaritza is doing well in rehab. She has been watching her food options and picking healthier options. She has been monitoing her BP and numbers have been WNL at rehab and at home. She takes her medications and has had no bad side effects to them.    Expected Outcomes Short: continue to attend rehab    long: continue to monitor BP and take medications          ITP Comments:  ITP Comments     Row Name 04/05/24 1139 04/18/24 1127 04/23/24 1030 05/14/24 1057     ITP Comments Virtual orientation visit completed for cardiac rehab with angioplasty with stent placement. On-site orientation visit scheduled for 04/18/24 at 10:30 pending clearance from Lauren Crate, NP. Patient arrived for 1st visit/orientation/education at 1030. Patient was referred to CR by Dr. Alexandria. Alvan attending due to PTCA with stent. During orientation advised patient on arrival and appointment times what to wear, what to do before, during and after exercise. Reviewed attendance and class policy.  Pt is scheduled to return Cardiac Rehab on 04/23/24 at 10:30. Pt was advised to come to class 15 minutes before class starts.  Discussed RPE/Dpysnea scales. Patient participated in warm up  stretches. Patient was able to complete 6 minute walk test.  Telemetry:NSR Patient was measured for the equipment. Discussed equipment safety with patient. Took patient pre-anthropometric measurements. Patient finished visit at 1127. First full day of exercise!  Patient was oriented to gym and equipment including functions, settings, policies, and procedures.  Patient's individual exercise prescription and treatment plan were reviewed.  All starting workloads were established based on the results of the 6 minute walk test done at initial orientation visit.  The plan for exercise progression was also introduced and progression will be customized based on patient's performance and goals. 30 day review completed. ITP sent to Dr. Dorn Alvan, Medical Director of Cardiac Rehab. Continue with ITP unless changes are made by physician.       Comments: 30 day review

## 2024-05-14 NOTE — Progress Notes (Signed)
 Daily Session Note  Patient Details  Name: Lauren Hamilton MRN: [id number] Date of Birth: 1946-02-16 Referring Provider:   Flowsheet Row CARDIAC REHAB PHASE II ORIENTATION from 04/18/2024 in [facility] CARDIAC REHABILITATION  Referring Provider Ladona Heinz MD    Encounter Date: 05/14/2024  Check In:  Session Check In - 05/14/24 1033       Check-In   Supervising physician immediately available to respond to emergencies See telemetry face sheet for immediately available MD    Location [facility]    Staff Present Powell Benders, BS, Exercise Physiologist;Azalynn Maxim Jackquline, BSN, RN, WTA-C;Victoria Zina, RN    Virtual Visit No    Medication changes reported     No    Fall or balance concerns reported    No    Tobacco Cessation No Change    Warm-up and Cool-down Performed on first and last piece of equipment    Resistance Training Performed Yes    VAD Patient? No    PAD/SET Patient? No      Pain Assessment   Currently in Pain? No/denies          Capillary Blood Glucose: No results found for this or any previous visit (from the past 24 hours).    Social History   Tobacco Use  Smoking Status Never   Passive exposure: Past  Smokeless Tobacco Never    Goals Met:  Independence with exercise equipment Exercise tolerated well No report of concerns or symptoms today Strength training completed today  Goals Unmet:  Not Applicable  Comments: Pt able to follow exercise prescription today without complaint.  Will continue to monitor for progression.

## 2024-05-15 ENCOUNTER — Encounter: Payer: Self-pay | Admitting: Adult Health

## 2024-05-16 ENCOUNTER — Encounter (HOSPITAL_COMMUNITY)
Admission: RE | Admit: 2024-05-16 | Discharge: 2024-05-16 | Disposition: A | Source: Ambulatory Visit | Attending: Cardiology

## 2024-05-16 DIAGNOSIS — Z95820 Peripheral vascular angioplasty status with implants and grafts: Secondary | ICD-10-CM

## 2024-05-16 DIAGNOSIS — Z48812 Encounter for surgical aftercare following surgery on the circulatory system: Secondary | ICD-10-CM | POA: Diagnosis not present

## 2024-05-16 NOTE — Progress Notes (Signed)
 Daily Session Note  Patient Details  Name: Lauren Hamilton MRN: [id number] Date of Birth: 1945-09-24 Referring Provider:   Flowsheet Row CARDIAC REHAB PHASE II ORIENTATION from 04/18/2024 in [facility] CARDIAC REHABILITATION  Referring Provider Ladona Heinz MD    Encounter Date: 05/16/2024  Check In:  Session Check In - 05/16/24 1103       Check-In   Supervising physician immediately available to respond to emergencies See telemetry face sheet for immediately available MD    Location [facility]    Staff Present Powell Benders, BS, Exercise Physiologist;Jessica Vonzell, MA, RCEP, CCRP, Sueellen Louder, RN, Engineer, Mining, RN    Virtual Visit No    Medication changes reported     No    Fall or balance concerns reported    No    Tobacco Cessation No Change    Warm-up and Cool-down Performed on first and last piece of equipment    Resistance Training Performed Yes    VAD Patient? No    PAD/SET Patient? No      Pain Assessment   Currently in Pain? No/denies    Pain Score 0-No pain    Multiple Pain Sites No          Capillary Blood Glucose: No results found for this or any previous visit (from the past 24 hours).    Social History   Tobacco Use  Smoking Status Never   Passive exposure: Past  Smokeless Tobacco Never    Goals Met:  Independence with exercise equipment Exercise tolerated well No report of concerns or symptoms today Strength training completed today  Goals Unmet:  Not Applicable  Comments: SABRASABRAPt able to follow exercise prescription today without complaint.  Will continue to monitor for progression.

## 2024-05-21 ENCOUNTER — Encounter (HOSPITAL_COMMUNITY)
Admission: RE | Admit: 2024-05-21 | Discharge: 2024-05-21 | Disposition: A | Source: Ambulatory Visit | Attending: Cardiology

## 2024-05-21 DIAGNOSIS — Z95820 Peripheral vascular angioplasty status with implants and grafts: Secondary | ICD-10-CM

## 2024-05-21 DIAGNOSIS — Z48812 Encounter for surgical aftercare following surgery on the circulatory system: Secondary | ICD-10-CM | POA: Diagnosis not present

## 2024-05-21 NOTE — Progress Notes (Signed)
 Daily Session Note  Patient Details  Name: Lauren Hamilton MRN: [id number] Date of Birth: Sep 04, 1945 Referring Provider:   Flowsheet Row CARDIAC REHAB PHASE II ORIENTATION from 04/18/2024 in [facility] CARDIAC REHABILITATION  Referring Provider Ladona Heinz MD    Encounter Date: 05/21/2024  Check In:  Session Check In - 05/21/24 1013       Check-In   Supervising physician immediately available to respond to emergencies See telemetry face sheet for immediately available MD    Location [facility]    Staff Present Laymon Rattler, BSN, RN, WTA-C;Heather Con, BS, Exercise Physiologist    Virtual Visit No    Medication changes reported     No    Fall or balance concerns reported    No    Tobacco Cessation No Change    Warm-up and Cool-down Performed on first and last piece of equipment    Resistance Training Performed Yes    VAD Patient? No    PAD/SET Patient? No      Pain Assessment   Currently in Pain? No/denies          Capillary Blood Glucose: No results found for this or any previous visit (from the past 24 hours).    Social History   Tobacco Use  Smoking Status Never   Passive exposure: Past  Smokeless Tobacco Never    Goals Met:  Independence with exercise equipment Exercise tolerated well No report of concerns or symptoms today Strength training completed today  Goals Unmet:  Not Applicable  Comments: Pt able to follow exercise prescription today without complaint.  Will continue to monitor for progression.

## 2024-05-23 ENCOUNTER — Encounter (HOSPITAL_COMMUNITY)
Admission: RE | Admit: 2024-05-23 | Discharge: 2024-05-23 | Disposition: A | Discharge status: Home / Self Care | Source: Ambulatory Visit | Attending: Cardiology | Admitting: Cardiology

## 2024-05-23 DIAGNOSIS — Z48812 Encounter for surgical aftercare following surgery on the circulatory system: Secondary | ICD-10-CM | POA: Diagnosis not present

## 2024-05-23 DIAGNOSIS — Z95820 Peripheral vascular angioplasty status with implants and grafts: Secondary | ICD-10-CM

## 2024-05-23 NOTE — Progress Notes (Signed)
 Daily Session Note  Patient Details  Name: Lauren Hamilton MRN: [id number] Date of Birth: [birth year] Referring Provider:   Flowsheet Row CARDIAC REHAB PHASE II ORIENTATION from 04/18/2024 in [facility] CARDIAC REHABILITATION  Referring Provider Ladona Heinz MD    Encounter Date: 05/23/2024  Check In:  Session Check In - 05/23/24 1015       Check-In   Supervising physician immediately available to respond to emergencies See telemetry face sheet for immediately available MD    Location [facility]    Staff Present Rolland Sake BSN, RN;Marguetta Windish Vicci, RN, BSN;Heather Con, BS, Exercise Physiologist    Virtual Visit No    Medication changes reported     No    Fall or balance concerns reported    No    Warm-up and Cool-down Performed on first and last piece of equipment    Resistance Training Performed Yes    VAD Patient? No    PAD/SET Patient? No      Pain Assessment   Currently in Pain? No/denies    Pain Score 0-No pain    Multiple Pain Sites No          Capillary Blood Glucose: No results found for this or any previous visit (from the past 24 hours).    Tobacco Use History[1]  Goals Met:  Independence with exercise equipment Exercise tolerated well No report of concerns or symptoms today Strength training completed today  Goals Unmet:  Not Applicable  Comments: Pt able to follow exercise prescription today without complaint.  Will continue to monitor for progression.        [1]  Social History Tobacco Use  Smoking Status Never   Passive exposure: Past  Smokeless Tobacco Never

## 2024-05-28 ENCOUNTER — Encounter (HOSPITAL_COMMUNITY)
Admission: RE | Admit: 2024-05-28 | Discharge: 2024-05-28 | Disposition: A | Source: Ambulatory Visit | Attending: Cardiology

## 2024-05-28 DIAGNOSIS — Z95820 Peripheral vascular angioplasty status with implants and grafts: Secondary | ICD-10-CM

## 2024-05-28 DIAGNOSIS — Z48812 Encounter for surgical aftercare following surgery on the circulatory system: Secondary | ICD-10-CM | POA: Diagnosis not present

## 2024-05-28 NOTE — Progress Notes (Signed)
 Daily Session Note  Patient Details  Name: Lauren Hamilton MRN: [id number] Date of Birth: 11-11-1945 Referring Provider:   Flowsheet Row CARDIAC REHAB PHASE II ORIENTATION from 04/18/2024 in [facility] CARDIAC REHABILITATION  Referring Provider Ladona Heinz MD    Encounter Date: 05/28/2024  Check In:  Session Check In - 05/28/24 1029       Check-In   Supervising physician immediately available to respond to emergencies See telemetry face sheet for immediately available MD    Location [facility]    Staff Present Laymon Rattler, BSN, RN, WTA-C;Heather Con, BS, Exercise Physiologist    Virtual Visit No    Medication changes reported     No    Fall or balance concerns reported    No    Tobacco Cessation No Change    Warm-up and Cool-down Performed on first and last piece of equipment    Resistance Training Performed Yes    VAD Patient? No    PAD/SET Patient? No      Pain Assessment   Currently in Pain? No/denies          Capillary Blood Glucose: No results found for this or any previous visit (from the past 24 hours).    Tobacco Use History[1]  Goals Met:  Independence with exercise equipment Exercise tolerated well No report of concerns or symptoms today Strength training completed today  Goals Unmet:  Not Applicable  Comments: Pt able to follow exercise prescription today without complaint.  Will continue to monitor for progression.        [1]  Social History Tobacco Use  Smoking Status Never   Passive exposure: Past  Smokeless Tobacco Never

## 2024-05-30 ENCOUNTER — Encounter (HOSPITAL_COMMUNITY): Admission: RE | Admit: 2024-05-30 | Discharge: 2024-05-30 | Attending: Cardiology

## 2024-05-30 DIAGNOSIS — Z95820 Peripheral vascular angioplasty status with implants and grafts: Secondary | ICD-10-CM

## 2024-05-30 DIAGNOSIS — Z48812 Encounter for surgical aftercare following surgery on the circulatory system: Secondary | ICD-10-CM | POA: Diagnosis not present

## 2024-05-30 NOTE — Progress Notes (Signed)
 Daily Session Note  Patient Details  Name: Lauren Hamilton MRN: [id number] Date of Birth: 1946-06-13 Referring Provider:   Flowsheet Row CARDIAC REHAB PHASE II ORIENTATION from 04/18/2024 in [facility] CARDIAC REHABILITATION  Referring Provider Ladona Heinz MD    Encounter Date: 05/30/2024  Check In:  Session Check In - 05/30/24 1031       Check-In   Supervising physician immediately available to respond to emergencies See telemetry face sheet for immediately available MD    Location [facility]    Staff Present Powell Benders, BS, Exercise Physiologist;Jessica Vonzell, MA, RCEP, CCRP, CCET;Terese Heier BSN, RN    Virtual Visit No    Medication changes reported     No    Fall or balance concerns reported    No    Tobacco Cessation No Change    Warm-up and Cool-down Performed on first and last piece of equipment    Resistance Training Performed Yes    VAD Patient? No    PAD/SET Patient? No      Pain Assessment   Currently in Pain? No/denies    Pain Score 0-No pain    Multiple Pain Sites No          Capillary Blood Glucose: No results found for this or any previous visit (from the past 24 hours).    Tobacco Use History[1]  Goals Met:  Independence with exercise equipment Exercise tolerated well No report of concerns or symptoms today Strength training completed today  Goals Unmet:  Not Applicable  Comments: .Pt able to follow exercise prescription today without complaint.  Will continue to monitor for progression.       [1]  Social History Tobacco Use  Smoking Status Never   Passive exposure: Past  Smokeless Tobacco Never

## 2024-06-04 ENCOUNTER — Encounter (HOSPITAL_COMMUNITY)
Admission: RE | Admit: 2024-06-04 | Discharge: 2024-06-04 | Disposition: A | Discharge status: Home / Self Care | Source: Ambulatory Visit | Attending: Cardiology | Admitting: Cardiology

## 2024-06-04 VITALS — Ht 66.0 in | Wt 223.3 lb

## 2024-06-04 DIAGNOSIS — Z95820 Peripheral vascular angioplasty status with implants and grafts: Secondary | ICD-10-CM

## 2024-06-04 DIAGNOSIS — Z48812 Encounter for surgical aftercare following surgery on the circulatory system: Secondary | ICD-10-CM | POA: Diagnosis not present

## 2024-06-04 NOTE — Patient Instructions (Signed)
 Discharge Patient Instructions  Patient Details  Name: Lauren Hamilton MRN: [id number] Date of Birth: 01/22/1946 Referring Provider:  Maree Isles, MD   Number of Visits: 16   Reason for Discharge:  Early Exit:  Personal   Diagnosis:  Status post angioplasty with stent  Initial Exercise Prescription:  Initial Exercise Prescription - 04/18/24 1200       Date of Initial Exercise RX and Referring Provider   Date 04/18/24    Referring Provider Ladona Heinz MD      Treadmill   MPH 1.4    Grade 0    Minutes 15    METs 2.07      NuStep   Level 1    SPM 60    Minutes 15    METs 1.8      Prescription Details   Frequency (times per week) 2    Duration Progress to 30 minutes of continuous aerobic without signs/symptoms of physical distress      Intensity   THRR 40-80% of Max Heartrate 113-133    Ratings of Perceived Exertion 11-13    Perceived Dyspnea 0-4      Resistance Training   Training Prescription Yes    Weight 4    Reps 10-15          Discharge Exercise Prescription (Final Exercise Prescription Changes):  Exercise Prescription Changes - 05/16/24 1500       Response to Exercise   Blood Pressure (Admit) 150/80    Blood Pressure (Exit) 124/62    Heart Rate (Admit) 81 bpm    Heart Rate (Exercise) 107 bpm    Heart Rate (Exit) 87 bpm    Rating of Perceived Exertion (Exercise) 14    Duration Continue with 30 min of aerobic exercise without signs/symptoms of physical distress.    Intensity THRR unchanged      Progression   Progression Continue to progress workloads to maintain intensity without signs/symptoms of physical distress.      Resistance Training   Training Prescription Yes    Weight 4    Reps 10-15      Treadmill   MPH 1.4    Grade 0    Minutes 15    METs 2.07      NuStep   Level 3    SPM 102    Minutes 15    METs 2.5          Functional Capacity:  6 Minute Walk     Row Name 04/18/24 1158 06/04/24 1049       6 Minute Walk    Phase Initial Discharge    Distance 820 feet 1000 feet    Distance % Change -- 22 %    Distance Feet Change -- 180 ft    Walk Time 6 minutes 6 minutes    # of Rest Breaks 0 0    MPH 1.55 1.9    METS 1.33 1.7    RPE 13 13    Perceived Dyspnea  1 1    VO2 Peak 4.66 6.2    Symptoms No No    Resting HR 93 bpm 105 bpm    Resting BP 126/60 140/64    Resting Oxygen  Saturation  96 % 96 %    Exercise Oxygen  Saturation  during 6 min walk 94 % 96 %    Max Ex. HR 110 bpm 105 bpm    Max Ex. BP 140/70 160/70    2 Minute Post BP 128/70 --  Nutrition & Weight - Outcomes:  Pre Biometrics - 04/18/24 1203       Pre Biometrics   Height 5' 6 (1.676 m)    Weight 102.4 kg    Waist Circumference 41 inches    Hip Circumference 52 inches    Waist to Hip Ratio 0.79 %    BMI (Calculated) 36.45    Single Leg Stand 0 seconds          Post Biometrics - 06/04/24 1053        Post  Biometrics   Height 5' 6 (1.676 m)    Weight 101.3 kg    Waist Circumference 40 inches    Hip Circumference 52 inches    Waist to Hip Ratio 0.77 %    BMI (Calculated) 36.06    Grip Strength 18.2 kg    Single Leg Stand 0 seconds           Goals reviewed with patient; copy given to patient.

## 2024-06-04 NOTE — Progress Notes (Addendum)
 Daily Session Note  Patient Details  Name: Lauren Hamilton MRN: [id number] Date of Birth: 1945/07/12 Referring Provider:   Flowsheet Row CARDIAC REHAB PHASE II ORIENTATION from 04/18/2024 in [facility] CARDIAC REHABILITATION  Referring Provider Ladona Heinz MD    Encounter Date: 06/04/2024  Check In:  Session Check In - 06/04/24 1015       Check-In   Supervising physician immediately available to respond to emergencies See telemetry face sheet for immediately available MD    Location [facility]    Staff Present Adrien Louder, RN, BSN;Heather Con, BS, Exercise Physiologist;Jessica Vonzell, MA, RCEP, CCRP, CCET    Virtual Visit No    Medication changes reported     No    Fall or balance concerns reported    No    Warm-up and Cool-down Performed on first and last piece of equipment    Resistance Training Performed Yes    VAD Patient? No    PAD/SET Patient? No      Pain Assessment   Currently in Pain? No/denies    Pain Score 0-No pain    Multiple Pain Sites No          Capillary Blood Glucose: No results found for this or any previous visit (from the past 24 hours).    Tobacco Use History[1]  Goals Met:  Independence with exercise equipment Exercise tolerated well No report of concerns or symptoms today Strength training completed today  Goals Unmet:  Not Applicable  Comments:  Admire graduated today from  rehab with 16 sessions completed.  Details of the patient's exercise prescription and what She needs to do in order to continue the prescription and progress were discussed with patient.  Patient was given a copy of prescription and goals.  Patient verbalized understanding. [name] plans to continue to exercise by walking at home.      [1]  Social History Tobacco Use  Smoking Status Never   Passive exposure: Past  Smokeless Tobacco Never

## 2024-06-05 NOTE — Progress Notes (Signed)
 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  We can increase to 75mg  daily in 4 weeks if needed Migraine rescue:  Stop rizatriptan.  She will try samples of Ubrelvy  100mg  Lifestyle modification: Limit use of pain relievers to no more than 9 days out of the month to prevent risk of rebound or medication-overuse headache. Diet modification/hydration/caffeine cessation Routine exercise Sleep hygiene Consider vitamins/supplements:  magnesium  citrate 400mg  daily, riboflavin 400mg  daily, CoQ10 100mg  three times daily Keep headache diary Follow up 6 months.    Subjective:  Lauren Hamilton is a 78 year old right-handed female with HTN, HLD, s/p coronary artery stent, CKD, DM 2, gout, OSA, arthritis and history of bilateral PEs and skin cancer who presents for migraines.  History supplemented by referring provider's note.  Onset:  2nd grade.  Location:  left frontal/orbital Quality:  throbbing Intensity:  8.5/10.  Aura:  shadow in the periphery of her left eye Prodrome:  absent Associated symptoms:  Nausea, photophobia, phonophobia, unilateral numbness and heaviness in left arm and leg Duration:  until she falls asleep Frequency:  8 days a month Triggers:  hunger Relieving factors:  sleep Activity:  aggravates  Past NSAIDS/analgesics:  etodolac/naproxen/NSAIDs (cough, renal), Toradol  IM Past abortive triptans:  sumatriptan tab Past abortive ergotamine:  none Past muscle relaxants:  Flexeril  Past anti-emetic:  Zofran  Past antihypertensive medications:  Coreg , lisinopril  (cough), losartan (cough), valsartan, amlodipine/CCB (cough), clonidine (dizziness), hydralazine  Past  antidepressant medications:  duloxetine  Past anticonvulsant medications:  none Past anti-CGRP:  Nurtec PRN (samples - variable efficacy but didn't always take it at the earliest onset) Past vitamins/Herbal/Supplements:  none Past antihistamines/decongestants:  Zyrtec Other past therapies:  none  Current NSAIDS/analgesics:  none Current triptans: rizatriptan 10mg  (makes her drowsy) Current ergotamine:  none Current anti-emetic:  none Current muscle relaxants:  none Current Antihypertensive medications:  metoprolol , spironolactone , furosemide , Imdur  Current Antidepressant medications:  none Current Anticonvulsant medications:  pregablin 25mg  BID Current anti-CGRP:  none Current Vitamins/Herbal/Supplements:  Mg Current Antihistamines/Decongestants:  levocetirizine Other therapy:  none Other medications:  Eliquis , allopurinol    Caffeine:  occasional coffee.  Diet:  Decaf soda.  Needs to increase water  Intake Depression:  no; Anxiety:  no Exercise:  Silver Sneakers 3 times a week.  Sleep hygiene:  Has OSA.  Uses CPAP but still feels drowsy in the morning.    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 advised 5 year follow up colonoscopy.    COLONOSCOPY  N/A 06/12/2018   [facility]: three 4-64mm polyps at heptic flexure, one 7mm polyp at hepatic flexure all tubular adenomas, non bleeding external and internal hemorrhoids   COLONOSCOPY WITH PROPOFOL  N/A 10/07/2021   Surgeon: Shaaron Lamar HERO, MD;  Nonbleeding grade 2 internal hemorrhoids, 2-3-4 mm polyps removed, otherwise normal exam.  Pathology with tubular adenomas.  Recommended repeat in 7 years if overall health permits.   CORONARY LITHOTRIPSY N/A 03/28/2024   Procedure: CORONARY LITHOTRIPSY;  Surgeon: Ladona Heinz, MD;  Location: [facility] INVASIVE CV LAB;  Service: Cardiovascular;  Laterality: N/A;   CORONARY STENT INTERVENTION N/A 03/28/2024   Procedure: CORONARY STENT INTERVENTION;  Surgeon: Ladona Heinz, MD;  Location: [facility] INVASIVE CV LAB;  Service: Cardiovascular;  Laterality: N/A;   CORONARY ULTRASOUND/IVUS N/A 03/28/2024   Procedure: Coronary Ultrasound/IVUS;  Surgeon: Ladona Heinz, MD;  Location: [facility] INVASIVE CV LAB;  Service: Cardiovascular;  Laterality: N/A;   ESOPHAGOGASTRODUODENOSCOPY (EGD) WITH PROPOFOL  N/A 06/18/2021   Surgeon: Eartha Flavors, Toribio, MD;  2cm hiatal hernia and multiple gastric polyps (3 were removed). Pathology with hyperplastic polyp.   KNEE ARTHROSCOPY Right    PARTIAL HYSTERECTOMY     POLYPECTOMY  06/12/2018   Procedure: POLYPECTOMY;  Surgeon: Shaaron Lamar HERO, MD;  Location: [facility] ENDO SUITE;  Service: Endoscopy;;  hep.flex   POLYPECTOMY  06/18/2021   Procedure: POLYPECTOMY;  Surgeon: Eartha Flavors Toribio, MD;  Location: [facility];  Service: Gastroenterology;;   POLYPECTOMY  10/07/2021   Procedure: POLYPECTOMY;  Surgeon: Shaaron Lamar HERO, MD;  Location: [facility];  Service: Endoscopy;;   RIGHT/LEFT HEART CATH AND CORONARY ANGIOGRAPHY N/A 03/28/2024   Procedure: RIGHT/LEFT HEART CATH AND CORONARY ANGIOGRAPHY;  Surgeon: Ladona Heinz, MD;  Location: [facility] INVASIVE CV LAB;  Service: Cardiovascular;  Laterality: N/A;   THYROIDECTOMY, PARTIAL     TONSILLECTOMY     TOTAL  KNEE ARTHROPLASTY Left 11/2021    MEDICATIONS: Medications Ordered Prior to Encounter[1]  ALLERGIES: Allergies[2]  FAMILY HISTORY: Family History  Problem Relation Age of Onset   Hypertension Mother    Heart attack Father    Cirrhosis Father    Emphysema Father    Allergic rhinitis Sister    Allergic rhinitis Brother    Breast cancer Maternal Aunt    Colon cancer Neg Hx     Objective:  Blood pressure (!) 154/80, pulse 95, weight 224 lb 9.6 oz (101.9 kg), SpO2 98%. General: No acute distress.  Patient appears well-groomed.   Head:  Normocephalic/atraumatic Eyes:  fundi examined but not visualized Neck: supple, no paraspinal tenderness, full range of motion Heart: regular rate and rhythm Neurological Exam: Mental status: alert and oriented to person, place, and time, speech fluent and not dysarthric, language intact. Cranial nerves: CN I: not tested CN II: pupils equal, round and reactive to light, visual fields intact CN III, IV, VI:  full range of motion, no nystagmus, no ptosis CN V: facial sensation intact. CN VII: upper and lower face symmetric CN VIII: hearing intact CN IX, X: gag intact, uvula midline CN XI: sternocleidomastoid and trapezius muscles intact CN XII: tongue midline Bulk & Tone: normal, no fasciculations. Motor:  muscle strength 5/5 throughout Sensation:  Pinprick and vibratory sensation intact. Deep Tendon Reflexes:  2+ throughout,  toes downgoing.   Finger to nose testing:  Without dysmetria.   Gait:  Normal station and stride.  Romberg negative.    Thank you for allowing me to take  part in the care of this patient.  Juliene Dunnings, DO  CC: Eligio Fairly, MD        [1]  Current Outpatient Medications on File Prior to Visit  Medication Sig Dispense Refill   allopurinol  (ZYLOPRIM ) 300 MG tablet Take 300 mg by mouth at bedtime.     apixaban  (ELIQUIS ) 5 MG TABS tablet Take 1 tablet (5 mg total) by mouth 2 (two) times daily.     Artificial  Tear Solution (SOOTHE XP) SOLN Apply 1-2 drops to eye as needed (dry eyes).      benzonatate (TESSALON) 200 MG capsule Take 200 mg by mouth 3 (three) times daily as needed for cough.     BREO ELLIPTA  100-25 MCG/ACT AEPB TAKE 1 PUFF BY MOUTH EVERY DAY 60 each 5   clopidogrel  (PLAVIX ) 75 MG tablet Take 1 tablet (75 mg total) by mouth daily. 30 tablet 5   dapagliflozin propanediol (FARXIGA) 5 MG TABS tablet Take 5 mg by mouth every Monday, Wednesday, and Friday.     furosemide  (LASIX ) 20 MG tablet Take 1-2 tablets (20-40 mg total) by mouth See admin instructions. Take 20 mg by mouth in the morning and 40 mg in the evening     isosorbide  mononitrate (IMDUR ) 120 MG 24 hr tablet Take 1 tablet (120 mg total) by mouth daily. 90 tablet 1   ketoconazole (NIZORAL) 2 % cream Apply 1 Application topically daily as needed for irritation.     ketoconazole (NIZORAL) 2 % shampoo Apply 1 Application topically daily as needed for irritation.     Lancets (ONETOUCH DELICA PLUS LANCET33G) MISC SMARTSIG:11.9 Topical Daily     MAGNESIUM  PO Take 1 tablet by mouth daily.     metoprolol  succinate (TOPROL -XL) 25 MG 24 hr tablet Take 25 mg by mouth daily.     montelukast (SINGULAIR) 10 MG tablet Take 10 mg by mouth at bedtime.     Omega-3 Fatty Acids (FISH OIL PO) Take 1 capsule by mouth daily.     pantoprazole  (PROTONIX ) 40 MG tablet Take 1 tablet (40 mg total) by mouth daily. 30 tablet 1   rizatriptan (MAXALT) 10 MG tablet Take 10 mg by mouth daily as needed for migraine.     rosuvastatin  (CRESTOR ) 10 MG tablet Take 1 tablet (10 mg total) by mouth every evening. 30 tablet 1   spironolactone  (ALDACTONE ) 25 MG tablet Take 1 tablet (25 mg total) by mouth every Tuesday, Thursday, Saturday, and Sunday.     vitamin C (ASCORBIC ACID) 250 MG tablet Take 250 mg by mouth daily.     vitamin E 200 UNIT capsule Take 200 Units by mouth daily.     No current facility-administered medications on file prior to visit.  [2]   Allergies Allergen Reactions   Linagliptin  Shortness Of Breath and Cough   Latex Rash    Reaction is only with internal contact    Clonidine And Derivatives     Dizzy, dry mouth   Etodolac     Unknown reaction   Hydralazine      Pt unsure of reaction    Hydrocodone Other (See Comments)    Altered mental status-Hallucination, tolerated in low doses    Naproxen Other (See Comments)    Damage to kidney    Statins Cough     tolerates lovastatin    Tramadol     Patient, Felt funny, didn't feel right, and felt weird.   Amlodipine Cough   Lisinopril  Cough   Losartan Cough   "

## 2024-06-07 ENCOUNTER — Ambulatory Visit: Admitting: Cardiology

## 2024-06-07 ENCOUNTER — Ambulatory Visit: Payer: PRIVATE HEALTH INSURANCE | Admitting: Cardiology

## 2024-06-07 ENCOUNTER — Ambulatory Visit: Admitting: Neurology

## 2024-06-07 VITALS — BP 154/80 | HR 95 | Wt 224.6 lb

## 2024-06-07 DIAGNOSIS — G43109 Migraine with aura, not intractable, without status migrainosus: Secondary | ICD-10-CM | POA: Diagnosis not present

## 2024-06-07 MED ORDER — VENLAFAXINE HCL ER 37.5 MG PO CP24: 37.5000 mg | ORAL_CAPSULE | Freq: Every day | ORAL | 5 refills | Status: AC

## 2024-06-07 MED ORDER — UBRELVY 100 MG PO TABS: ORAL_TABLET | ORAL | Status: DC

## 2024-06-07 NOTE — Patient Instructions (Addendum)
" °  Start VENLAFAXINE  37.5MG  DAILY.  Contact us  in 4 weeks with update and we can increase dose if needed. STOP RIZATRIPTAN.  Instead, take UBRELVY  at earliest onset of headache.  May repeat dose once in 2 hours if needed.  Maximum 2 tablets in 24 hours.  GIVE ME UPDATE Limit use of pain relievers to no more than 9 days out of the month.  These medications include acetaminophen , NSAIDs (ibuprofen/Advil/Motrin, naproxen/Aleve, triptans (Imitrex/sumatriptan), Excedrin, and narcotics.  This will help reduce risk of rebound headaches. Be aware of common food triggers Routine exercise Stay adequately hydrated (aim for 64 oz water  daily) Keep headache diary Maintain proper stress management Maintain proper sleep hygiene Do not skip meals Consider supplements:  magnesium  citrate 400mg  daily, riboflavin 400mg  daily, coenzyme Q10 100mg  three times daily.  "

## 2024-06-10 ENCOUNTER — Encounter: Payer: Self-pay | Admitting: Neurology

## 2024-06-11 ENCOUNTER — Encounter (HOSPITAL_COMMUNITY)

## 2024-06-11 ENCOUNTER — Encounter (HOSPITAL_COMMUNITY): Payer: Self-pay | Admitting: *Deleted

## 2024-06-11 DIAGNOSIS — Z95820 Peripheral vascular angioplasty status with implants and grafts: Secondary | ICD-10-CM

## 2024-06-11 NOTE — Progress Notes (Signed)
 Cardiac Individual Treatment Plan  Patient Details  Name: Lauren Hamilton MRN: [id number] Date of Birth: Dec 19, 1945 Referring Provider:   Flowsheet Row CARDIAC REHAB PHASE II ORIENTATION from 04/18/2024 in [facility] CARDIAC REHABILITATION  Referring Provider Ladona Heinz MD    Initial Encounter Date:  Flowsheet Row CARDIAC REHAB PHASE II ORIENTATION from 04/18/2024 in [facility] CARDIAC REHABILITATION  Date 04/18/24    Visit Diagnosis: Status post angioplasty with stent  Patient's Home Medications on Admission: Current Medications[1]  Past Medical History: 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 off anticoagulation   Bronchitis    Cancer (HCC)    Skin   Diabetes mellitus without complication (HCC)    Gout    H/O colonoscopy with polypectomy    Hypercholesteremia    Hypertension    Renal disorder    Renal insufficiency     Tobacco Use: Tobacco Use History[2]  Labs: Review Flowsheet       Latest Ref Rng & Units 06/16/2021 06/17/2021 11/03/2023 03/28/2024  Labs for ITP Cardiac and Pulmonary Rehab  Cholestrol <200 mg/dL - 899  858  -  LDL (calc) mg/dL (calc) - 42  70  -  HDL-C > OR = 50 mg/dL - 24  53  -  Trlycerides <150 mg/dL - 830  898  -  Hemoglobin A1c 4.8 - 5.6 % 8.1  - - -  PH, Arterial 7.35 - 7.45 - - - 7.398   PCO2 arterial 32 - 48 mmHg - - - 42.3   Bicarbonate 20.0 - 28.0 mmol/L - - - 25.7  26.1   TCO2 22 - 32 mmol/L - - - 27  27   O2 Saturation % - - - 72  84     Details       Multiple values from one day are sorted in reverse-chronological order         Capillary Blood Glucose: Lab Results  Component Value Date   GLUCAP 119 (H) 04/25/2024   GLUCAP 120 (H) 04/25/2024   GLUCAP 136 (H) 04/23/2024   GLUCAP 98 03/28/2024   GLUCAP 108 (H) 03/28/2024     Exercise  Target Goals: Exercise Program Goal: Individual exercise prescription set using results from initial 6 min walk test and THRR while considering  patients activity barriers and safety.   Exercise Prescription Goal: Starting with aerobic activity 30 plus minutes a day, 3 days per week for initial exercise prescription. Provide home exercise prescription and guidelines that participant acknowledges understanding prior to discharge.  Activity Barriers & Risk Stratification:  Activity Barriers & Cardiac Risk Stratification - 04/05/24 1112       Activity Barriers & Cardiac Risk Stratification   Activity Barriers Back Problems;Left Knee Replacement;Right Knee Replacement;Chest Pain/Angina;Balance Concerns;Arthritis    Cardiac Risk Stratification High          6 Minute Walk:  6 Minute Walk     Row Name 04/18/24 1158 06/04/24 1049       6 Minute Walk   Phase Initial Discharge    Distance 820 feet 1000 feet    Distance % Change -- 22 %    Distance Feet Change -- 180 ft    Walk Time 6 minutes 6 minutes    # of Rest Breaks 0 0    MPH  1.55 1.9    METS 1.33 1.7    RPE 13 13    Perceived Dyspnea  1 1    VO2 Peak 4.66 6.2    Symptoms No No    Resting HR 93 bpm 105 bpm    Resting BP 126/60 140/64    Resting Oxygen  Saturation  96 % 96 %    Exercise Oxygen  Saturation  during 6 min walk 94 % 96 %    Max Ex. HR 110 bpm 105 bpm    Max Ex. BP 140/70 160/70    2 Minute Post BP 128/70 --       Oxygen  Initial Assessment:   Oxygen  Re-Evaluation:   Oxygen  Discharge (Final Oxygen  Re-Evaluation):   Initial Exercise Prescription:  Initial Exercise Prescription - 04/18/24 1200       Date of Initial Exercise RX and Referring Provider   Date 04/18/24    Referring Provider Ladona Heinz MD      Treadmill   MPH 1.4    Grade 0    Minutes 15    METs 2.07      NuStep   Level 1    SPM 60    Minutes 15    METs 1.8      Prescription Details   Frequency (times per week) 2     Duration Progress to 30 minutes of continuous aerobic without signs/symptoms of physical distress      Intensity   THRR 40-80% of Max Heartrate 113-133    Ratings of Perceived Exertion 11-13    Perceived Dyspnea 0-4      Resistance Training   Training Prescription Yes    Weight 4    Reps 10-15          Perform Capillary Blood Glucose checks as needed.  Exercise Prescription Changes:   Exercise Prescription Changes     Row Name 04/18/24 1200 05/16/24 1500 06/04/24 1000         Response to Exercise   Blood Pressure (Admit) 126/60 150/80 --     Blood Pressure (Exercise) 140/70 -- --     Blood Pressure (Exit) 128/70 124/62 --     Heart Rate (Admit) 93 bpm 81 bpm --     Heart Rate (Exercise) 110 bpm 107 bpm --     Heart Rate (Exit) 90 bpm 87 bpm --     Oxygen  Saturation (Admit) 96 % -- --     Oxygen  Saturation (Exercise) 94 % -- --     Oxygen  Saturation (Exit) 94 % -- --     Rating of Perceived Exertion (Exercise) 13 14 --     Perceived Dyspnea (Exercise) 1 -- --     Duration -- Continue with 30 min of aerobic exercise without signs/symptoms of physical distress. --     Intensity -- THRR unchanged --       Progression   Progression -- Continue to progress workloads to maintain intensity without signs/symptoms of physical distress. --       Paramedic Prescription -- Yes --     Weight -- 4 --     Reps -- 10-15 --       Treadmill   MPH -- 1.4 --     Grade -- 0 --     Minutes -- 15 --     METs -- 2.07 --       NuStep   Level -- 3 --     SPM -- 102 --  Minutes -- 15 --     METs -- 2.5 --       Home Exercise Plan   Plans to continue exercise at -- -- Home (comment)     Frequency -- -- Add 3 additional days to program exercise sessions.     Initial Home Exercises Provided -- -- 06/04/24        Exercise Comments:   Exercise Comments     Row Name 04/23/24 1030           Exercise Comments First full day of exercise!  Patient was  oriented to gym and equipment including functions, settings, policies, and procedures.  Patient's individual exercise prescription and treatment plan were reviewed.  All starting workloads were established based on the results of the 6 minute walk test done at initial orientation visit.  The plan for exercise progression was also introduced and progression will be customized based on patient's performance and goals.          Exercise Goals and Review:   Exercise Goals     Row Name 04/18/24 1202             Exercise Goals   Increase Physical Activity Yes       Intervention Provide advice, education, support and counseling about physical activity/exercise needs.;Develop an individualized exercise prescription for aerobic and resistive training based on initial evaluation findings, risk stratification, comorbidities and participant's personal goals.       Expected Outcomes Short Term: Attend rehab on a regular basis to increase amount of physical activity.;Long Term: Add in home exercise to make exercise part of routine and to increase amount of physical activity.;Long Term: Exercising regularly at least 3-5 days a week.       Increase Strength and Stamina Yes       Intervention Provide advice, education, support and counseling about physical activity/exercise needs.;Develop an individualized exercise prescription for aerobic and resistive training based on initial evaluation findings, risk stratification, comorbidities and participant's personal goals.       Expected Outcomes Short Term: Increase workloads from initial exercise prescription for resistance, speed, and METs.;Short Term: Perform resistance training exercises routinely during rehab and add in resistance training at home;Long Term: Improve cardiorespiratory fitness, muscular endurance and strength as measured by increased METs and functional capacity ( )       Able to understand and use rate of perceived exertion (RPE) scale Yes        Intervention Provide education and explanation on how to use RPE scale       Expected Outcomes Short Term: Able to use RPE daily in rehab to express subjective intensity level;Long Term:  Able to use RPE to guide intensity level when exercising independently       Able to understand and use Dyspnea scale Yes       Intervention Provide education and explanation on how to use Dyspnea scale       Expected Outcomes Short Term: Able to use Dyspnea scale daily in rehab to express subjective sense of shortness of breath during exertion;Long Term: Able to use Dyspnea scale to guide intensity level when exercising independently       Knowledge and understanding of Target Heart Rate Range (THRR) Yes       Intervention Provide education and explanation of THRR including how the numbers were predicted and where they are located for reference       Expected Outcomes Short Term: Able to state/look up THRR;Long Term: Able to use  THRR to govern intensity when exercising independently;Short Term: Able to use daily as guideline for intensity in rehab       Able to check pulse independently Yes       Intervention Provide education and demonstration on how to check pulse in carotid and radial arteries.;Review the importance of being able to check your own pulse for safety during independent exercise       Expected Outcomes Short Term: Able to explain why pulse checking is important during independent exercise;Long Term: Able to check pulse independently and accurately       Understanding of Exercise Prescription Yes       Intervention Provide education, explanation, and written materials on patient's individual exercise prescription       Expected Outcomes Short Term: Able to explain program exercise prescription;Long Term: Able to explain home exercise prescription to exercise independently          Exercise Goals Re-Evaluation :  Exercise Goals Re-Evaluation     Row Name 04/30/24 1049 05/21/24 0838 05/28/24  1038         Exercise Goal Re-Evaluation   Exercise Goals Review Increase Physical Activity;Increase Strength and Stamina;Understanding of Exercise Prescription Increase Physical Activity;Increase Strength and Stamina;Understanding of Exercise Prescription Increase Physical Activity;Increase Strength and Stamina;Able to understand and use rate of perceived exertion (RPE) scale     Comments Trianna is doing well in rehab. She stated that she does not like exerciisng at all but is trying to get to it. She is exercising at [facility] with [employer] 3xs a week plus in rehab 2 days week. Antavia is doing well in rehab. She is increaing her levels on the nustep and is now exericsing at level 3. She has not increased walking speed. WIll continue to monitor and progress as ablem Desani is doing well in rehab. She is increasing her levels on the Nustep. She is sticking to seated exercises because her back has been bothering her. She will graduate next Tuesday per her choice and she is going to continue going to silver sneakers 3 days a week which she does on her off days here.     Expected Outcomes Short: continue to attend rehab program   long: continue to exercise Shor: increase walking speed   long: contiue to exercise Short: Finish up the last week in rehab. Long: Exercise once program is finished.         Discharge Exercise Prescription (Final Exercise Prescription Changes):  Exercise Prescription Changes - 06/04/24 1000       Home Exercise Plan   Plans to continue exercise at Home (comment)    Frequency Add 3 additional days to program exercise sessions.    Initial Home Exercises Provided 06/04/24          Nutrition:  Target Goals: Understanding of nutrition guidelines, daily intake of sodium 1500mg , cholesterol 200mg , calories 30% from fat and 7% or less from saturated fats, daily to have 5 or more servings of fruits and vegetables.  Biometrics:  Pre Biometrics - 04/18/24 1203        Pre Biometrics   Height 5' 6 (1.676 m)    Weight 225 lb 12 oz (102.4 kg)    Waist Circumference 41 inches    Hip Circumference 52 inches    Waist to Hip Ratio 0.79 %    BMI (Calculated) 36.45    Single Leg Stand 0 seconds          Post Biometrics - 06/04/24 1053  Post  Biometrics   Height 5' 6 (1.676 m)    Weight 223 lb 5.2 oz (101.3 kg)    Waist Circumference 40 inches    Hip Circumference 52 inches    Waist to Hip Ratio 0.77 %    BMI (Calculated) 36.06    Grip Strength 18.2 kg    Single Leg Stand 0 seconds          Nutrition Therapy Plan and Nutrition Goals:   Nutrition Assessments:  MEDIFICTS Score Key: >=70 Need to make dietary changes  40-70 Heart Healthy Diet <= 40 Therapeutic Level Cholesterol Diet  Flowsheet Row CARDIAC REHAB PHASE II EXERCISE from 06/04/2024 in [facility] CARDIAC REHABILITATION  Picture Your Plate Total Score on Discharge 70   Picture Your Plate Scores: <59 Unhealthy dietary pattern with much room for improvement. 41-50 Dietary pattern unlikely to meet recommendations for good health and room for improvement. 51-60 More healthful dietary pattern, with some room for improvement.  >60 Healthy dietary pattern, although there may be some specific behaviors that could be improved.    Nutrition Goals Re-Evaluation:  Nutrition Goals Re-Evaluation     Row Name 04/30/24 1053 05/28/24 1041           Goals   Nutrition Goal healthy eating --      Comment Jaden is trying to eat healthier. She lives with her daughter and her family so she eats what they cook. They have been trying to cook healthier options. She will do chicken taco salad when they make tacos. She has been watching what she eats in moderation. She drinks mostly waters with about 2-3 sodas a week. She is a swwet person and loves choloclate but she has been trying to cut back. and eat in moderation Landen is trying to eat healthier. She lives with her daughter and her  family so she eats what they cook. They have been trying to cook healthier options. She will do chicken taco salad when they make tacos. She has been watching what she eats in moderation. She drinks mostly water  with about 2-3 sodas a week.  She has cut out on her snacks, and she will do a protein shake for breakfast.      Expected Outcome Short: continue to watch sweets   long: contiune to pick healthy optiions Short: Continue to attend rehab. Long: Continue to enjoy foods but in moderation.         Nutrition Goals Discharge (Final Nutrition Goals Re-Evaluation):  Nutrition Goals Re-Evaluation - 05/28/24 1041       Goals   Comment Fernande is trying to eat healthier. She lives with her daughter and her family so she eats what they cook. They have been trying to cook healthier options. She will do chicken taco salad when they make tacos. She has been watching what she eats in moderation. She drinks mostly water  with about 2-3 sodas a week.  She has cut out on her snacks, and she will do a protein shake for breakfast.    Expected Outcome Short: Continue to attend rehab. Long: Continue to enjoy foods but in moderation.          Psychosocial: Target Goals: Acknowledge presence or absence of significant depression and/or stress, maximize coping skills, provide positive support system. Participant is able to verbalize types and ability to use techniques and skills needed for reducing stress and depression.  Initial Review & Psychosocial Screening:  Initial Psych Review & Screening - 04/05/24 1127  Initial Review   Current issues with None Identified      Family Dynamics   Good Support System? Yes    Comments Patient's daughter supports her.      Barriers   Psychosocial barriers to participate in program The patient should benefit from training in stress management and relaxation.;There are no identifiable barriers or psychosocial needs.      Screening Interventions   Interventions  To provide support and resources with identified psychosocial needs;Encouraged to exercise;Provide feedback about the scores to participant    Expected Outcomes Short Term goal: Utilizing psychosocial counselor, staff and physician to assist with identification of specific Stressors or current issues interfering with healing process. Setting desired goal for each stressor or current issue identified.;Long Term Goal: Stressors or current issues are controlled or eliminated.;Short Term goal: Identification and review with participant of any Quality of Life or Depression concerns found by scoring the questionnaire.;Long Term goal: The participant improves quality of Life and PHQ9 Scores as seen by post scores and/or verbalization of changes          Quality of Life Scores:  Quality of Life - 06/04/24 1058       Quality of Life Scores   Health/Function Pre 20.73 %    Health/Function Post 27.1 %    Health/Function % Change 30.73 %    Socioeconomic Pre 25.31 %    Socioeconomic Post 30 %    Socioeconomic % Change  18.53 %    Psych/Spiritual Pre 28.21 %    Psych/Spiritual Post 30 %    Psych/Spiritual % Change 6.35 %    Family Pre 27 %    Family Post 30 %    Family % Change 11.11 %    GLOBAL Pre 24.17 %    GLOBAL Post 28.72 %    GLOBAL % Change 18.82 %         Scores of 19 and below usually indicate a poorer quality of life in these areas.  A difference of  2-3 points is a clinically meaningful difference.  A difference of 2-3 points in the total score of the Quality of Life Index has been associated with significant improvement in overall quality of life, self-image, physical symptoms, and general health in studies assessing change in quality of life.  PHQ-9: Review Flowsheet       06/04/2024 04/18/2024  Depression screen PHQ 2/9  Decreased Interest 0 3  Down, Depressed, Hopeless 0 0  PHQ - 2 Score 0 3  Altered sleeping 0 1  Tired, decreased energy 1 2  Change in appetite 0 0   Feeling bad or failure about yourself  0 0  Trouble concentrating 0 1  Moving slowly or fidgety/restless 0 0  Suicidal thoughts 0 0  PHQ-9 Score 1 7  Difficult doing work/chores Not difficult at all Not difficult at all   Interpretation of Total Score  Total Score Depression Severity:  1-4 = Minimal depression, 5-9 = Mild depression, 10-14 = Moderate depression, 15-19 = Moderately severe depression, 20-27 = Severe depression   Psychosocial Evaluation and Intervention:  Psychosocial Evaluation - 04/05/24 1128       Psychosocial Evaluation & Interventions   Interventions Relaxation education;Stress management education;Encouraged to exercise with the program and follow exercise prescription    Comments Patient was referred to cardiac rehab with angioplasty with stent placement. She denies any depression or anxiety. She says she sleeps okay. She lives with her daughter. She says she continues to have chest  pain that she was having prior to her stent. She has follow up with [facility] 11/6 and due to chest pain, we are moving her orientation visist to after this follow up OV. Patient goes to silver sneakers at the [facility] but can not return until her follow up. 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 She is not sure she will be able to participate but wants to try. Her OA pain may be a barrier. Her goals for the program are to get back to silver sneakers and learn about a healthy lifestyle.    Expected Outcomes Short Term: Patient will start the program and attend consistently. Long Term: Patient will complete the program meeting personal goals.    Continue Psychosocial Services  Follow up required by staff          Psychosocial Re-Evaluation:  Psychosocial Re-Evaluation     Row Name 04/30/24 1051 05/28/24 1042           Psychosocial Re-Evaluation   Current issues with None Identified None Identified      Comments Cam is doing well oin rehab. She has not sleep issues.  she uses her CPAP and has been doing well. She also does not have any major stressors in her life. She stated that its life and has everyday small stressors that do not bother her. Brianah is doing well oin rehab. She has not sleep issues. she uses her CPAP and has been doing well. She also does not have any major stressors in her life. She stated that its life and has everyday small stressors that do not bother her.      Expected Outcomes Short: continue to use CPAP for good sleep  long: exercise to help woth overall wellbeing Short: continue to use CPAP for good sleep  long: exercise to help woth overall wellbeing      Interventions Encouraged to attend Cardiac Rehabilitation for the exercise Encouraged to attend Cardiac Rehabilitation for the exercise      Continue Psychosocial Services  Follow up required by staff Follow up required by staff         Psychosocial Discharge (Final Psychosocial Re-Evaluation):  Psychosocial Re-Evaluation - 05/28/24 1042       Psychosocial Re-Evaluation   Current issues with None Identified    Comments Brittish is doing well oin rehab. She has not sleep issues. she uses her CPAP and has been doing well. She also does not have any major stressors in her life. She stated that its life and has everyday small stressors that do not bother her.    Expected Outcomes Short: continue to use CPAP for good sleep  long: exercise to help woth overall wellbeing    Interventions Encouraged to attend Cardiac Rehabilitation for the exercise    Continue Psychosocial Services  Follow up required by staff          Vocational Rehabilitation: Provide vocational rehab assistance to qualifying candidates.   Vocational Rehab Evaluation & Intervention:  Vocational Rehab - 04/05/24 1127       Initial Vocational Rehab Evaluation & Intervention   Assessment shows need for Vocational Rehabilitation No      Vocational Rehab Re-Evaulation   Comments Patient is retired.           Education: Education Goals: Education classes will be provided on a weekly basis, covering required topics. Participant will state understanding/return demonstration of topics presented.  Learning Barriers/Preferences:   Education Topics: Hypertension, Hypertension Reduction -Define heart  disease and high blood pressure. Discus how high blood pressure affects the body and ways to reduce high blood pressure.   Exercise and Your Heart -Discuss why it is important to exercise, the FITT principles of exercise, normal and abnormal responses to exercise, and how to exercise safely.   Angina -Discuss definition of angina, causes of angina, treatment of angina, and how to decrease risk of having angina.   Cardiac Medications -Review what the following cardiac medications are used for, how they affect the body, and side effects that may occur when taking the medications.  Medications include Aspirin , Beta blockers, calcium  channel blockers, ACE Inhibitors, angiotensin receptor blockers, diuretics, digoxin, and antihyperlipidemics.   Congestive Heart Failure -Discuss the definition of CHF, how to live with CHF, the signs and symptoms of CHF, and how keep track of weight and sodium intake.   Heart Disease and Intimacy -Discus the effect sexual activity has on the heart, how changes occur during intimacy as we age, and safety during sexual activity.   Smoking Cessation / COPD -Discuss different methods to quit smoking, the health benefits of quitting smoking, and the definition of COPD.   Nutrition I: Fats -Discuss the types of cholesterol, what cholesterol does to the heart, and how cholesterol levels can be controlled.   Nutrition II: Labels -Discuss the different components of food labels and how to read food label   Heart Parts/Heart Disease and PAD -Discuss the anatomy of the heart, the pathway of blood circulation through the heart, and these are affected by heart  disease.   Stress I: Signs and Symptoms -Discuss the causes of stress, how stress may lead to anxiety and depression, and ways to limit stress.   Stress II: Relaxation -Discuss different types of relaxation techniques to limit stress.   Warning Signs of Stroke / TIA -Discuss definition of a stroke, what the signs and symptoms are of a stroke, and how to identify when someone is having stroke.   Knowledge Questionnaire Score:  Knowledge Questionnaire Score - 06/04/24 1057       Knowledge Questionnaire Score   Post Score 20/24          Core Components/Risk Factors/Patient Goals at Admission:  Personal Goals and Risk Factors at Admission - 04/05/24 1127       Core Components/Risk Factors/Patient Goals on Admission    Weight Management Obesity    Diabetes Yes    Intervention Provide education about signs/symptoms and action to take for hypo/hyperglycemia.;Provide education about proper nutrition, including hydration, and aerobic/resistive exercise prescription along with prescribed medications to achieve blood glucose in normal ranges: Fasting glucose 65-99 mg/dL    Expected Outcomes Short Term: Participant verbalizes understanding of the signs/symptoms and immediate care of hyper/hypoglycemia, proper foot care and importance of medication, aerobic/resistive exercise and nutrition plan for blood glucose control.;Long Term: Attainment of HbA1C < 7%.    Hypertension Yes    Intervention Provide education on lifestyle modifcations including regular physical activity/exercise, weight management, moderate sodium restriction and increased consumption of fresh fruit, vegetables, and low fat dairy, alcohol  moderation, and smoking cessation.;Monitor prescription use compliance.    Expected Outcomes Short Term: Continued assessment and intervention until BP is < 140/66mm HG in hypertensive participants. < 130/5mm HG in hypertensive participants with diabetes, heart failure or chronic kidney  disease.    Lipids Yes    Intervention Provide education and support for participant on nutrition & aerobic/resistive exercise along with prescribed medications to achieve LDL 70mg , HDL >40mg .  Expected Outcomes Short Term: Participant states understanding of desired cholesterol values and is compliant with medications prescribed. Participant is following exercise prescription and nutrition guidelines.;Long Term: Cholesterol controlled with medications as prescribed, with individualized exercise RX and with personalized nutrition plan. Value goals: LDL < 70mg , HDL > 40 mg.          Core Components/Risk Factors/Patient Goals Review:   Goals and Risk Factor Review     Row Name 04/30/24 1056 05/28/24 1039           Core Components/Risk Factors/Patient Goals Review   Personal Goals Review Weight Management/Obesity;Hypertension Weight Management/Obesity;Hypertension;Diabetes      Review Roxanne is doing well in rehab. She has been watching her food options and picking healthier options. She has been monitoing her BP and numbers have been WNL at rehab and at home. She takes her medications and has had no bad side effects to them. Dior is doing well in rehab! She checks her blood sugar everyday and her BP at least 2/3 times per week. She takes all her medicines as prescribed.      Expected Outcomes Short: continue to attend rehab    long: continue to monitor BP and take medications Short: continue to attend rehab    long: continue to monitor BP and take medications         Core Components/Risk Factors/Patient Goals at Discharge (Final Review):   Goals and Risk Factor Review - 05/28/24 1039       Core Components/Risk Factors/Patient Goals Review   Personal Goals Review Weight Management/Obesity;Hypertension;Diabetes    Review Karrine is doing well in rehab! She checks her blood sugar everyday and her BP at least 2/3 times per week. She takes all her medicines as prescribed.    Expected  Outcomes Short: continue to attend rehab    long: continue to monitor BP and take medications          ITP Comments:  ITP Comments     Row Name 04/05/24 1139 04/18/24 1127 04/23/24 1030 05/14/24 1057 06/11/24 1442   ITP Comments Virtual orientation visit completed for cardiac rehab with angioplasty with stent placement. On-site orientation visit scheduled for 04/18/24 at 10:30 pending clearance from Almarie Crate, NP. Patient arrived for 1st visit/orientation/education at 1030. Patient was referred to CR by Dr. Alexandria. Alvan attending due to PTCA with stent. During orientation advised patient on arrival and appointment times what to wear, what to do before, during and after exercise. Reviewed attendance and class policy.  Pt is scheduled to return Cardiac Rehab on 04/23/24 at 10:30. Pt was advised to come to class 15 minutes before class starts.  Discussed RPE/Dpysnea scales. Patient participated in warm up stretches. Patient was able to complete 6 minute walk test.  Telemetry:NSR Patient was measured for the equipment. Discussed equipment safety with patient. Took patient pre-anthropometric measurements. Patient finished visit at 1127. First full day of exercise!  Patient was oriented to gym and equipment including functions, settings, policies, and procedures.  Patient's individual exercise prescription and treatment plan were reviewed.  All starting workloads were established based on the results of the 6 minute walk test done at initial orientation visit.  The plan for exercise progression was also introduced and progression will be customized based on patient's performance and goals. 30 day review completed. ITP sent to Dr. Dorn Alvan, Medical Director of Cardiac Rehab. Continue with ITP unless changes are made by physician. 30 day review completed. ITP sent to Dr. Dorn Alvan, Medical Director  of Cardiac Rehab. Continue with ITP unless changes are made by physician. Pt stated that she  wanted to graduate on 06/04/24 but upon leaving said she may be back if Dr. Alvan said she should continue program, however her appt got moved to 06/22/23, will wait til then for return to rehab.      Comments: 30 day review     [1]  Current Outpatient Medications:    allopurinol  (ZYLOPRIM ) 300 MG tablet, Take 300 mg by mouth at bedtime., Disp: , Rfl:    apixaban  (ELIQUIS ) 5 MG TABS tablet, Take 1 tablet (5 mg total) by mouth 2 (two) times daily., Disp: , Rfl:    Artificial Tear Solution (SOOTHE XP) SOLN, Apply 1-2 drops to eye as needed (dry eyes). , Disp: , Rfl:    benzonatate (TESSALON) 200 MG capsule, Take 200 mg by mouth 3 (three) times daily as needed for cough., Disp: , Rfl:    BREO ELLIPTA  100-25 MCG/ACT AEPB, TAKE 1 PUFF BY MOUTH EVERY DAY, Disp: 60 each, Rfl: 5   clopidogrel  (PLAVIX ) 75 MG tablet, Take 1 tablet (75 mg total) by mouth daily., Disp: 30 tablet, Rfl: 5   dapagliflozin propanediol (FARXIGA) 5 MG TABS tablet, Take 5 mg by mouth every Monday, Wednesday, and Friday., Disp: , Rfl:    furosemide  (LASIX ) 20 MG tablet, Take 1-2 tablets (20-40 mg total) by mouth See admin instructions. Take 20 mg by mouth in the morning and 40 mg in the evening, Disp: , Rfl:    isosorbide  mononitrate (IMDUR ) 120 MG 24 hr tablet, Take 1 tablet (120 mg total) by mouth daily., Disp: 90 tablet, Rfl: 1   ketoconazole (NIZORAL) 2 % cream, Apply 1 Application topically daily as needed for irritation., Disp: , Rfl:    ketoconazole (NIZORAL) 2 % shampoo, Apply 1 Application topically daily as needed for irritation., Disp: , Rfl:    Lancets (ONETOUCH DELICA PLUS LANCET33G) MISC, SMARTSIG:11.9 Topical Daily, Disp: , Rfl:    MAGNESIUM  PO, Take 1 tablet by mouth daily., Disp: , Rfl:    metoprolol  succinate (TOPROL -XL) 25 MG 24 hr tablet, Take 25 mg by mouth daily., Disp: , Rfl:    montelukast (SINGULAIR) 10 MG tablet, Take 10 mg by mouth at bedtime., Disp: , Rfl:    Omega-3 Fatty Acids (FISH OIL PO), Take  1 capsule by mouth daily., Disp: , Rfl:    pantoprazole  (PROTONIX ) 40 MG tablet, Take 1 tablet (40 mg total) by mouth daily., Disp: 30 tablet, Rfl: 1   rosuvastatin  (CRESTOR ) 10 MG tablet, Take 1 tablet (10 mg total) by mouth every evening., Disp: 30 tablet, Rfl: 1   spironolactone  (ALDACTONE ) 25 MG tablet, Take 1 tablet (25 mg total) by mouth every Tuesday, Thursday, Saturday, and Sunday., Disp: , Rfl:    Ubrogepant  (UBRELVY ) 100 MG TABS, Samples of this drug were given to the patient, quantity 3, Lot Number [id number] exp6/27  Samples of this drug were given to the patient, quantity 1, Lot Number [id number] exp 04/28, Disp: , Rfl:    venlafaxine  XR (EFFEXOR  XR) 37.5 MG 24 hr capsule, Take 1 capsule (37.5 mg total) by mouth daily with breakfast., Disp: 30 capsule, Rfl: 5   vitamin C (ASCORBIC ACID) 250 MG tablet, Take 250 mg by mouth daily., Disp: , Rfl:    vitamin E 200 UNIT capsule, Take 200 Units by mouth daily., Disp: , Rfl:  [2]  Social History Tobacco Use  Smoking Status Never   Passive exposure: Past  Smokeless  Tobacco Never

## 2024-06-14 ENCOUNTER — Telehealth: Payer: Self-pay | Admitting: Neurology

## 2024-06-14 NOTE — Telephone Encounter (Signed)
 Per patient since taking the Venlafaxine  at night she off balance. She is taking it as directed.

## 2024-06-14 NOTE — Telephone Encounter (Signed)
 Patient advised of DR.jaffe note, Stop venlafaxine . She has tried beta blocker and venlafaxine . I would like to try a CGRP inhibitor. Which would most likely be approved by [employer]?   Try Aimovig

## 2024-06-14 NOTE — Telephone Encounter (Signed)
 Pt called in this afternoon. Pt stated that the prescription called: venlafaxine  XR (EFFEXOR  XR) 37.5 MG 24 hr capsule . The prescription is making her nausea and sleepy so Pt started taking the prescription at night instead of taking it in the morning and that works better for her. Pt stated that she feels like she took to much medicine and she is off balance as well. Thanks

## 2024-06-16 ENCOUNTER — Other Ambulatory Visit: Payer: Self-pay | Admitting: Neurology

## 2024-06-16 MED ORDER — AIMOVIG 140 MG/ML ~~LOC~~ SOAJ: 140.0000 mg | SUBCUTANEOUS | 11 refills | Status: AC

## 2024-06-17 NOTE — Telephone Encounter (Signed)
 Patient advised Per Dr.Jaffe, Aimovig  140mg  sent to [facility] in [facility]

## 2024-06-20 ENCOUNTER — Ambulatory Visit: Admitting: Neurology

## 2024-06-27 ENCOUNTER — Telehealth: Payer: Self-pay | Admitting: Pharmacy Technician

## 2024-06-27 ENCOUNTER — Other Ambulatory Visit (HOSPITAL_COMMUNITY): Payer: Self-pay

## 2024-06-27 ENCOUNTER — Telehealth: Payer: Self-pay | Admitting: Neurology

## 2024-06-27 NOTE — Telephone Encounter (Signed)
 Pt states [employer] denied Migraine Injection since we did not answer their questions, pls call [employer] [phone] Name: Lauren Hamilton

## 2024-06-27 NOTE — Telephone Encounter (Signed)
 Question just received 06/26/24.  Received via fax, and sent to the PA via fax once it was received.   May take a minute to get answer but they will call.

## 2024-06-27 NOTE — Telephone Encounter (Signed)
 I don't see where a PA had been submitted for there Aimovig . PA has been submitted, and telephone encounter has been created. Please see telephone encounter dated 1.15.26.

## 2024-06-27 NOTE — Telephone Encounter (Signed)
 Pharmacy Patient Advocate Encounter   Received notification from Pt Calls Messages that prior authorization for AIMOVIG  140MG  is required/requested.   Insurance verification completed.   The patient is insured through [facility] ADVANTAGE/RX ADVANCE.   Per test claim: PA required; PA submitted to above mentioned insurance via Latent Key/confirmation #/EOC [id number] Status is pending

## 2024-07-01 ENCOUNTER — Encounter: Payer: Self-pay | Admitting: Cardiology

## 2024-07-01 ENCOUNTER — Ambulatory Visit: Attending: Cardiology | Admitting: Cardiology

## 2024-07-01 VITALS — BP 130/72 | HR 85 | Ht 66.0 in | Wt 222.6 lb

## 2024-07-01 DIAGNOSIS — R079 Chest pain, unspecified: Secondary | ICD-10-CM | POA: Diagnosis not present

## 2024-07-01 DIAGNOSIS — I10 Essential (primary) hypertension: Secondary | ICD-10-CM

## 2024-07-01 DIAGNOSIS — I2510 Atherosclerotic heart disease of native coronary artery without angina pectoris: Secondary | ICD-10-CM

## 2024-07-01 MED ORDER — ISOSORBIDE MONONITRATE ER 60 MG PO TB24: 60.0000 mg | ORAL_TABLET | Freq: Every day | ORAL | 3 refills | Status: AC

## 2024-07-01 NOTE — Progress Notes (Signed)
 "     Clinical Summary Lauren Hamilton is a 79 y.o.female seen today for follow up of the following medical problems.      1. Chest pain/CAD - ER visit 10/2019 with chest pain - troponins negative, no acute  EKG changes. CXR no acute process - 12/2019 nuclear stress test: no ischemia - 01/2021 echo: LVEF 60-65%, no WMAs, grade I dd, normal RV    - 02/2023 ER visit with chest pain and DOE [facility] - trop neg, proBNP 212,  CXR clear.   - symptoms on and off for a month prior. Typically with exertion initially - had [place] to [place] the [place] before, symptoms were increasing. Some intermittent pain left upper chest - on car ride home constant pain several ours. Not positional - after getting back from beach, went to ER for evalaution - since ER visit constant left sided chest pain, ongoing DOE.        03/2023 echo: LVEF 60-65%, no WMAs, grade I dd 04/2023 nuclear stress: inferior/inferolateral/inferoapical infarct with mild to moderate peri-infarct ischemia.   03/2024 cath: mid LAD 20%, occlued LCX fills  by collatearls, prox to mid RCA 85%. DES to RCA. Mean PA 30, PCWP 15, CI 4.29  - some chest pains left sided. Burning like pain, typically occurs with activity. Resolves with rest. Not as severe as prior symptoms before her stent.      2. HTN - allergies clonidine, norvasc, lisinopril , losartan.  - more recently reported side effects to hydralazine , aldactone  - issues with gout - most recently has been on coreg , chlorthalidone .  - renal increased coreg  to 12.5mg  bid  - compliant with meds     3. LE edema - noted by renal at 01/2021 visit, an echo was ordered 01/2021 echo LVEF 60-65%, no WMAs, grade I dd, normal RV  - taking lasix  20mg  bid, has compression stockigns - overall controlled.    4. 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 HLD - 05/2024 TC 141 TG 87 HDL 56 LDL 68 Past Medical History:  Diagnosis Date   Anemia    Arthritis     Bilateral pulmonary embolism (HCC) 10/2017   H/O unprovoked PE in 2017/2018 treated with anticoagulation for one year and 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 off anticoagulation   Bronchitis    Cancer (HCC)    Skin   Diabetes mellitus without complication (HCC)    Gout    H/O colonoscopy with polypectomy    Hypercholesteremia    Hypertension    Renal disorder    Renal insufficiency      Allergies[1]   Current Outpatient Medications  Medication Sig Dispense Refill   allopurinol  (ZYLOPRIM ) 300 MG tablet Take 300 mg by mouth at bedtime.     apixaban  (ELIQUIS ) 5 MG TABS tablet Take 1 tablet (5 mg total) by mouth 2 (two) times daily.     Artificial Tear Solution (SOOTHE XP) SOLN Apply 1-2 drops to eye as needed (dry eyes).      benzonatate (TESSALON) 200 MG capsule Take 200 mg by mouth 3 (three) times daily as needed for cough.     BREO ELLIPTA  100-25 MCG/ACT AEPB TAKE 1 PUFF BY MOUTH EVERY DAY 60 each 5   clopidogrel  (PLAVIX ) 75 MG tablet Take 1 tablet (75 mg total) by mouth daily. 30 tablet 5   dapagliflozin propanediol (FARXIGA) 5 MG TABS tablet Take 5 mg by mouth every Monday, Wednesday, and Friday.  Erenumab -aooe (AIMOVIG ) 140 MG/ML SOAJ Inject 140 mg into the skin every 28 (twenty-eight) days. 1.12 mL 11   furosemide  (LASIX ) 20 MG tablet Take 1-2 tablets (20-40 mg total) by mouth See admin instructions. Take 20 mg by mouth in the morning and 40 mg in the evening     isosorbide  mononitrate (IMDUR ) 120 MG 24 hr tablet Take 1 tablet (120 mg total) by mouth daily. 90 tablet 1   ketoconazole (NIZORAL) 2 % cream Apply 1 Application topically daily as needed for irritation.     ketoconazole (NIZORAL) 2 % shampoo Apply 1 Application topically daily as needed for irritation.     Lancets (ONETOUCH DELICA PLUS LANCET33G) MISC SMARTSIG:11.9 Topical Daily     MAGNESIUM  PO Take 1 tablet by mouth daily.     metoprolol  succinate (TOPROL -XL) 25 MG 24 hr tablet Take 25 mg by mouth daily.      montelukast (SINGULAIR) 10 MG tablet Take 10 mg by mouth at bedtime.     Omega-3 Fatty Acids (FISH OIL PO) Take 1 capsule by mouth daily.     pantoprazole  (PROTONIX ) 40 MG tablet Take 1 tablet (40 mg total) by mouth daily. 30 tablet 1   rosuvastatin  (CRESTOR ) 10 MG tablet Take 1 tablet (10 mg total) by mouth every evening. 30 tablet 1   spironolactone  (ALDACTONE ) 25 MG tablet Take 1 tablet (25 mg total) by mouth every Tuesday, Thursday, Saturday, and Sunday.     Ubrogepant  (UBRELVY ) 100 MG TABS Samples of this drug were given to the patient, quantity 3, Lot Number [id number] exp6/27  Samples of this drug were given to the patient, quantity 1, Lot Number [id number] exp 04/28     venlafaxine  XR (EFFEXOR  XR) 37.5 MG 24 hr capsule Take 1 capsule (37.5 mg total) by mouth daily with breakfast. 30 capsule 5   vitamin C (ASCORBIC ACID) 250 MG tablet Take 250 mg by mouth daily.     vitamin E 200 UNIT capsule Take 200 Units by mouth daily.     No current facility-administered medications for this visit.     Past Surgical History:  Procedure Laterality Date   bilateral heel spur removed     carpel tunnel Bilateral    CHOLECYSTECTOMY     COLONOSCOPY  2013   [facility]: hyperplastic polyps. advised 5 year follow up colonoscopy.    COLONOSCOPY N/A 06/12/2018   [facility]: three 4-77mm polyps at heptic flexure, one 7mm polyp at hepatic flexure all tubular adenomas, non bleeding external and internal hemorrhoids   COLONOSCOPY WITH PROPOFOL  N/A 10/07/2021   Surgeon: Shaaron Lamar HERO, MD;  Nonbleeding grade 2 internal hemorrhoids, 2-3-4 mm polyps removed, otherwise normal exam.  Pathology with tubular adenomas.  Recommended repeat in 7 years if overall health permits.   CORONARY LITHOTRIPSY N/A 03/28/2024   Procedure: CORONARY LITHOTRIPSY;  Surgeon: Ladona Heinz, MD;  Location: [facility] INVASIVE CV LAB;  Service: Cardiovascular;  Laterality: N/A;   CORONARY STENT INTERVENTION N/A 03/28/2024    Procedure: CORONARY STENT INTERVENTION;  Surgeon: Ladona Heinz, MD;  Location: [facility] INVASIVE CV LAB;  Service: Cardiovascular;  Laterality: N/A;   CORONARY ULTRASOUND/IVUS N/A 03/28/2024   Procedure: Coronary Ultrasound/IVUS;  Surgeon: Ladona Heinz, MD;  Location: [facility] INVASIVE CV LAB;  Service: Cardiovascular;  Laterality: N/A;   ESOPHAGOGASTRODUODENOSCOPY (EGD) WITH PROPOFOL  N/A 06/18/2021   Surgeon: Eartha Flavors, Toribio, MD;  2cm hiatal hernia and multiple gastric polyps (3 were removed). Pathology with hyperplastic polyp.   KNEE ARTHROSCOPY Right  PARTIAL HYSTERECTOMY     POLYPECTOMY  06/12/2018   Procedure: POLYPECTOMY;  Surgeon: Shaaron Lamar HERO, MD;  Location: [facility] ENDO SUITE;  Service: Endoscopy;;  hep.flex   POLYPECTOMY  06/18/2021   Procedure: POLYPECTOMY;  Surgeon: Eartha Angelia Sieving, MD;  Location: [facility] ENDO SUITE;  Service: Gastroenterology;;   POLYPECTOMY  10/07/2021   Procedure: POLYPECTOMY;  Surgeon: Shaaron Lamar HERO, MD;  Location: [facility] ENDO SUITE;  Service: Endoscopy;;   RIGHT/LEFT HEART CATH AND CORONARY ANGIOGRAPHY N/A 03/28/2024   Procedure: RIGHT/LEFT HEART CATH AND CORONARY ANGIOGRAPHY;  Surgeon: Ladona Heinz, MD;  Location: [facility] INVASIVE CV LAB;  Service: Cardiovascular;  Laterality: N/A;   THYROIDECTOMY, PARTIAL     TONSILLECTOMY     TOTAL KNEE ARTHROPLASTY Left 11/2021     Allergies[2]    Family History  Problem Relation Age of Onset   Hypertension Mother    Heart attack Father    Cirrhosis Father    Emphysema Father    Allergic rhinitis Sister    Allergic rhinitis Brother    Breast cancer Maternal Aunt    Colon cancer Neg Hx      Social History Lauren Hamilton reports that she has never smoked. She has been exposed to tobacco smoke. She has never used smokeless tobacco. Lauren Hamilton reports no history of alcohol  use.    Physical Examination Today's Vitals   07/01/24 0843  BP: 130/72  Pulse: 85  SpO2: 95%  Weight: 222 lb 9.6 oz (101 kg)  Height: 5' 6  (1.676 m)  PainSc: 2   PainLoc: Chest   Body mass index is 35.93 kg/m.  Gen: resting comfortably, no acute distress HEENT: no scleral icterus, pupils equal round and reactive, no palptable cervical adenopathy,  CV: RRR, no mrg, no jvd Resp: Clear to auscultation bilaterally GI: abdomen is soft, non-tender, non-distended, normal bowel sounds, no hepatosplenomegaly MSK: extremities are warm, no edema.  Skin: warm, no rash Neuro:  no focal deficits Psych: appropriate affect   Diagnostic Studies  12/2019 nuclear stress There was no ST segment deviation noted during stress. The study is normal. There are no perfusion defects consistent with prior infarct or current ischemia. This is a low risk study. The left ventricular ejection fraction is normal (55-65%).   01/2021 echo IMPRESSIONS     1. Left ventricular ejection fraction, by estimation, is 60 to 65%. The  left ventricle has normal function. The left ventricle has no regional  wall motion abnormalities. There is moderate left ventricular hypertrophy.  Left ventricular diastolic  parameters are consistent with Grade I diastolic dysfunction (impaired  relaxation).   2. Right ventricular systolic function is normal. The right ventricular  size is normal. There is normal pulmonary artery systolic pressure.   3. The mitral valve is normal in structure. No evidence of mitral valve  regurgitation. No evidence of mitral stenosis.   4. The aortic valve is tricuspid. Aortic valve regurgitation is not  visualized. No aortic stenosis is present.   5. The inferior vena cava is normal in size with greater than 50%  respiratory variability, suggesting right atrial pressure of 3 mmHg.    04/2023 nuclear stress    Findings are consistent with prior inferior/inferolatera/inferoapical infarction with mild to moderate peri-infarct ischemia. Low to intermediate risk study   No ST deviation was noted.   LV perfusion is abnormal. Moderate size  mild to moderate intensity inferior/inferolateral/inferoapical defect with mild to moderate reversibility.   Left ventricular function is normal. Nuclear stress EF: 73%.  The left ventricular ejection fraction is hyperdynamic (>65%). End diastolic cavity size is normal.   Assessment and Plan   1.CAD - some chronic chest pains less intense since her stenting - perhaps ongoing symptoms related to CTO, perhaps collaterals not quite sufficent  - will try increasing imdur  to 180mg  daily.   2. HTN - at goal,continue current meds   Dorn PHEBE Ross, M.D.     [1]  Allergies Allergen Reactions   Linagliptin  Shortness Of Breath and Cough   Latex Rash    Reaction is only with internal contact    Clonidine And Derivatives     Dizzy, dry mouth   Etodolac     Unknown reaction   Hydralazine      Pt unsure of reaction    Hydrocodone Other (See Comments)    Altered mental status-Hallucination, tolerated in low doses    Naproxen Other (See Comments)    Damage to kidney    Statins Cough     tolerates lovastatin    Tramadol     Patient, Felt funny, didn't feel right, and felt weird.   Amlodipine Cough   Lisinopril  Cough   Losartan Cough  [2]  Allergies Allergen Reactions   Linagliptin  Shortness Of Breath and Cough   Latex Rash    Reaction is only with internal contact    Clonidine And Derivatives     Dizzy, dry mouth   Etodolac     Unknown reaction   Hydralazine      Pt unsure of reaction    Hydrocodone Other (See Comments)    Altered mental status-Hallucination, tolerated in low doses    Naproxen Other (See Comments)    Damage to kidney    Statins Cough     tolerates lovastatin    Tramadol     Patient, Felt funny, didn't feel right, and felt weird.   Amlodipine Cough   Lisinopril  Cough   Losartan Cough   "

## 2024-07-01 NOTE — Patient Instructions (Signed)
 Medication Instructions:  Your physician has recommended you make the following change in your medication:   -Increase Imdur  to 180 mg once daily.   *If you need a refill on your cardiac medications before your next appointment, please call your pharmacy*  Lab Work: None If you have labs (blood work) drawn today and your tests are completely normal, you will receive your results only by: MyChart Message (if you have MyChart) OR A paper copy in the mail If you have any lab test that is abnormal or we need to change your treatment, we will call you to review the results.  Testing/Procedures: None  Follow-Up: At [facility], you and your health needs are our priority.  As part of our continuing mission to provide you with exceptional heart care, our providers are all part of one team.  This team includes your primary Cardiologist (physician) and Advanced Practice Providers or APPs (Physician Assistants and Nurse Practitioners) who all work together to provide you with the care you need, when you need it.  Your next appointment:   4 month(s)  Provider:   You may see Alvan Carrier, MD or one of the following Advanced Practice Providers on your designated Care Team:   Laymon Qua, PA-C  Scotesia Pavillion, [place] Olivia Pavy, [place]     We recommend signing up for the patient portal called MyChart.  Sign up information is provided on this After Visit Summary.  MyChart is used to connect with patients for Virtual Visits (Telemedicine).  Patients are able to view lab/test results, encounter notes, upcoming appointments, etc.  Non-urgent messages can be sent to your provider as well.   To learn more about what you can do with MyChart, go to [email or web address].   Other Instructions Thank you for choosing [facility]!

## 2024-07-03 NOTE — Telephone Encounter (Signed)
 Lauren Hamilton called in stating that her insurance denied the Aimovig , due to not having questions sent back in the 3 days that was requested. She stated that it is almost time for her injection.   PH: [phone]

## 2024-07-04 ENCOUNTER — Other Ambulatory Visit (HOSPITAL_COMMUNITY): Payer: Self-pay

## 2024-07-04 ENCOUNTER — Telehealth: Payer: Self-pay | Admitting: Pharmacy Technician

## 2024-07-04 NOTE — Telephone Encounter (Signed)
 Pharmacy Patient Advocate Encounter   Received notification from Pt Calls Messages that prior authorization for AIMOVIG  140MG  is required/requested.   Insurance verification completed.   The patient is insured through [facility] ADVANTAGE/RX ADVANCE.   Per test claim: PA required; PA submitted to above mentioned insurance via Phone Key/confirmation #/[id number] Status is pending  FAXED CHART NOTES: [phone]

## 2024-07-04 NOTE — Telephone Encounter (Signed)
 Pharmacy Patient Advocate Encounter  Received notification from [employer] that Prior Authorization for AIMOVIG  140MG  has been DENIED.  See denial reason below. No denial letter attached in CMM. Will attach denial letter to Media tab once received.

## 2024-07-04 NOTE — Telephone Encounter (Signed)
 LMOVM for patient to call the office.

## 2024-07-04 NOTE — Telephone Encounter (Signed)
 Patient advised

## 2024-07-04 NOTE — Telephone Encounter (Signed)
 Called insurance this morning for clarification. Was informed that patient initiated PA on 1.12.26. I did not receive question set. PA request was initiated on 1.15.26 from PA Team, but was denied due previous denial. Was also informed that the PA initiated on 1.12.26 by patient was for 70mg  Aimovig .   PA has been submitted, and telephone encounter has been created. Please see telephone encounter dated 1.22.26.

## 2024-07-09 ENCOUNTER — Encounter (HOSPITAL_COMMUNITY): Payer: Self-pay | Admitting: *Deleted

## 2024-07-09 DIAGNOSIS — Z95820 Peripheral vascular angioplasty status with implants and grafts: Secondary | ICD-10-CM

## 2024-07-09 NOTE — Progress Notes (Signed)
 Cardiac Individual Treatment Plan  Patient Details  Name: Lauren Hamilton MRN: [id number] Date of Birth: [birth year] Referring Provider:   Flowsheet Row CARDIAC REHAB PHASE II ORIENTATION from 04/18/2024 in [facility] CARDIAC REHABILITATION  Referring Provider Ladona Heinz MD    Initial Encounter Date:  Flowsheet Row CARDIAC REHAB PHASE II ORIENTATION from 04/18/2024 in [facility] CARDIAC [facility]IL[facility]  Date 04/18/24    Visit Diagnosis: Status post angioplasty with stent  Patient's Home Medications on Admission: Current Medications[1]  Past Medical History: 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 off anticoagulation   Bronchitis    Cancer (HCC)    Skin   Diabetes mellitus without complication (HCC)    Gout    H/O colonoscopy with polypectomy    Hypercholesteremia    Hypertension    Renal disorder    Renal insufficiency     Tobacco Use: Tobacco Use History[2]  Labs: Review Flowsheet       Latest Ref Rng & Units 06/16/2021 06/17/2021 11/03/2023 03/28/2024  Labs for ITP Cardiac and Pulmonary Rehab  Cholestrol <200 mg/dL - 899  858  -  LDL (calc) mg/dL (calc) - 42  70  -  HDL-C > OR = 50 mg/dL - 24  53  -  Trlycerides <150 mg/dL - 830  898  -  Hemoglobin A1c 4.8 - 5.6 % 8.1  - - -  PH, Arterial 7.35 - 7.45 - - - 7.398   PCO2 arterial 32 - 48 mmHg - - - 42.3   Bicarbonate 20.0 - 28.0 mmol/L - - - 25.7  26.1   TCO2 22 - 32 mmol/L - - - 27  27   O2 Saturation % - - - 72  84     Details       Multiple values from one day are sorted in reverse-chronological order         Capillary Blood Glucose: Lab Results  Component Value Date   GLUCAP 119 (H) 04/25/2024   GLUCAP 120 (H) 04/25/2024   GLUCAP 136 (H) 04/23/2024   GLUCAP 98 03/28/2024   GLUCAP 108 (H) 03/28/2024     Exercise  Target Goals: Exercise Program Goal: Individual exercise prescription set using results from initial 6 min walk test and THRR while considering  patients activity barriers and safety.   Exercise Prescription Goal: Starting with aerobic activity 30 plus minutes a day, 3 days per week for initial exercise prescription. Provide home exercise prescription and guidelines that participant acknowledges understanding prior to discharge.  Activity Barriers & Risk Stratification:  Activity Barriers & Cardiac Risk Stratification - 04/05/24 1112       Activity Barriers & Cardiac Risk Stratification   Activity Barriers Back Problems;Left Knee Replacement;Right Knee Replacement;Chest Pain/Angina;Balance Concerns;Arthritis    Cardiac Risk Stratification High          6 Minute Walk:  6 Minute Walk     Row Name 04/18/24 1158 06/04/24 1049       6 Minute Walk   Phase Initial Discharge    Distance 820 feet 1000 feet    Distance % Change -- 22 %    Distance Feet Change -- 180 ft    Walk Time 6 minutes 6 minutes    # of Rest Breaks 0 0    MPH  1.55 1.9    METS 1.33 1.7    RPE 13 13    Perceived Dyspnea  1 1    VO2 Peak 4.66 6.2    Symptoms No No    Resting HR 93 bpm 105 bpm    Resting BP 126/60 140/64    Resting Oxygen  Saturation  96 % 96 %    Exercise Oxygen  Saturation  during 6 min walk 94 % 96 %    Max Ex. HR 110 bpm 105 bpm    Max Ex. BP 140/70 160/70    2 Minute Post BP 128/70 --       Oxygen  Initial Assessment:   Oxygen  Re-Evaluation:   Oxygen  Discharge (Final Oxygen  Re-Evaluation):   Initial Exercise Prescription:  Initial Exercise Prescription - 04/18/24 1200       Date of Initial Exercise RX and Referring Provider   Date 04/18/24    Referring Provider Ladona Heinz MD      Treadmill   MPH 1.4    Grade 0    Minutes 15    METs 2.07      NuStep   Level 1    SPM 60    Minutes 15    METs 1.8      Prescription Details   Frequency (times per week) 2     Duration Progress to 30 minutes of continuous aerobic without signs/symptoms of physical distress      Intensity   THRR 40-80% of Max Heartrate 113-133    Ratings of Perceived Exertion 11-13    Perceived Dyspnea 0-4      Resistance Training   Training Prescription Yes    Weight 4    Reps 10-15          Perform Capillary Blood Glucose checks as needed.  Exercise Prescription Changes:   Exercise Prescription Changes     Row Name 04/18/24 1200 05/16/24 1500 06/04/24 1000         Response to Exercise   Blood Pressure (Admit) 126/60 150/80 --     Blood Pressure (Exercise) 140/70 -- --     Blood Pressure (Exit) 128/70 124/62 --     Heart Rate (Admit) 93 bpm 81 bpm --     Heart Rate (Exercise) 110 bpm 107 bpm --     Heart Rate (Exit) 90 bpm 87 bpm --     Oxygen  Saturation (Admit) 96 % -- --     Oxygen  Saturation (Exercise) 94 % -- --     Oxygen  Saturation (Exit) 94 % -- --     Rating of Perceived Exertion (Exercise) 13 14 --     Perceived Dyspnea (Exercise) 1 -- --     Duration -- Continue with 30 min of aerobic exercise without signs/symptoms of physical distress. --     Intensity -- THRR unchanged --       Progression   Progression -- Continue to progress workloads to maintain intensity without signs/symptoms of physical distress. --       Paramedic Prescription -- Yes --     Weight -- 4 --     Reps -- 10-15 --       Treadmill   MPH -- 1.4 --     Grade -- 0 --     Minutes -- 15 --     METs -- 2.07 --       NuStep   Level -- 3 --     SPM -- 102 --  Minutes -- 15 --     METs -- 2.5 --       Home Exercise Plan   Plans to continue exercise at -- -- Home (comment)     Frequency -- -- Add 3 additional days to program exercise sessions.     Initial Home Exercises Provided -- -- 06/04/24        Exercise Comments:   Exercise Comments     Row Name 04/23/24 1030           Exercise Comments First full day of exercise!  Patient was  oriented to gym and equipment including functions, settings, policies, and procedures.  Patient's individual exercise prescription and treatment plan were reviewed.  All starting workloads were established based on the results of the 6 minute walk test done at initial orientation visit.  The plan for exercise progression was also introduced and progression will be customized based on patient's performance and goals.          Exercise Goals and Review:   Exercise Goals     Row Name 04/18/24 1202             Exercise Goals   Increase Physical Activity Yes       Intervention Provide advice, education, support and counseling about physical activity/exercise needs.;Develop an individualized exercise prescription for aerobic and resistive training based on initial evaluation findings, risk stratification, comorbidities and participant's personal goals.       Expected Outcomes Short Term: Attend rehab on a regular basis to increase amount of physical activity.;Long Term: Add in home exercise to make exercise part of routine and to increase amount of physical activity.;Long Term: Exercising regularly at least 3-5 days a week.       Increase Strength and Stamina Yes       Intervention Provide advice, education, support and counseling about physical activity/exercise needs.;Develop an individualized exercise prescription for aerobic and resistive training based on initial evaluation findings, risk stratification, comorbidities and participant's personal goals.       Expected Outcomes Short Term: Increase workloads from initial exercise prescription for resistance, speed, and METs.;Short Term: Perform resistance training exercises routinely during rehab and add in resistance training at home;Long Term: Improve cardiorespiratory fitness, muscular endurance and strength as measured by increased METs and functional capacity ( )       Able to understand and use rate of perceived exertion (RPE) scale Yes        Intervention Provide education and explanation on how to use RPE scale       Expected Outcomes Short Term: Able to use RPE daily in rehab to express subjective intensity level;Long Term:  Able to use RPE to guide intensity level when exercising independently       Able to understand and use Dyspnea scale Yes       Intervention Provide education and explanation on how to use Dyspnea scale       Expected Outcomes Short Term: Able to use Dyspnea scale daily in rehab to express subjective sense of shortness of breath during exertion;Long Term: Able to use Dyspnea scale to guide intensity level when exercising independently       Knowledge and understanding of Target Heart Rate Range (THRR) Yes       Intervention Provide education and explanation of THRR including how the numbers were predicted and where they are located for reference       Expected Outcomes Short Term: Able to state/look up THRR;Long Term: Able to use  THRR to govern intensity when exercising independently;Short Term: Able to use daily as guideline for intensity in rehab       Able to check pulse independently Yes       Intervention Provide education and demonstration on how to check pulse in carotid and radial arteries.;Review the importance of being able to check your own pulse for safety during independent exercise       Expected Outcomes Short Term: Able to explain why pulse checking is important during independent exercise;Long Term: Able to check pulse independently and accurately       Understanding of Exercise Prescription Yes       Intervention Provide education, explanation, and written materials on patient's individual exercise prescription       Expected Outcomes Short Term: Able to explain program exercise prescription;Long Term: Able to explain home exercise prescription to exercise independently          Exercise Goals Re-Evaluation :  Exercise Goals Re-Evaluation     Row Name 04/30/24 1049 05/21/24 0838 05/28/24  1038         Exercise Goal Re-Evaluation   Exercise Goals Review Increase Physical Activity;Increase Strength and Stamina;Understanding of Exercise Prescription Increase Physical Activity;Increase Strength and Stamina;Understanding of Exercise Prescription Increase Physical Activity;Increase Strength and Stamina;Able to understand and use rate of perceived exertion (RPE) scale     Comments Alexxis is doing well in rehab. She stated that she does not like exerciisng at all but is trying to get to it. She is exercising at [facility] with [facility] [facility] 3xs a week plus in rehab 2 days week. Lynett is doing well in rehab. She is increaing her levels on the nustep and is now exericsing at level 3. She has not increased walking speed. WIll continue to monitor and progress as ablem Zeeva is doing well in rehab. She is increasing her levels on the Nustep. She is sticking to seated exercises because her back has been bothering her. She will graduate next Tuesday per her choice and she is going to continue going to silver sneakers 3 days a week which she does on her off days here.     Expected Outcomes Short: continue to attend rehab program   long: continue to exercise Shor: increase walking speed   long: contiue to exercise Short: Finish up the last week in rehab. Long: Exercise once program is finished.         Discharge Exercise Prescription (Final Exercise Prescription Changes):  Exercise Prescription Changes - 06/04/24 1000       Home Exercise Plan   Plans to continue exercise at Home (comment)    Frequency Add 3 additional days to program exercise sessions.    Initial Home Exercises Provided 06/04/24          Nutrition:  Target Goals: Understanding of nutrition guidelines, daily intake of sodium 1500mg , cholesterol 200mg , calories 30% from fat and 7% or less from saturated fats, daily to have 5 or more servings of fruits and vegetables.  Biometrics:  Pre Biometrics - 04/18/24 1203        Pre Biometrics   Height 5' 6 (1.676 m)    Weight 225 lb 12 oz (102.4 kg)    Waist Circumference 41 inches    Hip Circumference 52 inches    Waist to Hip Ratio 0.79 %    BMI (Calculated) 36.45    Single Leg Stand 0 seconds          Post Biometrics - 06/04/24 1053  Post  Biometrics   Height 5' 6 (1.676 m)    Weight 223 lb 5.2 oz (101.3 kg)    Waist Circumference 40 inches    Hip Circumference 52 inches    Waist to Hip Ratio 0.77 %    BMI (Calculated) 36.06    Grip Strength 18.2 kg    Single Leg Stand 0 seconds          Nutrition Therapy Plan and Nutrition Goals:   Nutrition Assessments:  MEDIFICTS Score Key: >=70 Need to make dietary changes  40-70 Heart Healthy Diet <= 40 Therapeutic Level Cholesterol Diet  Flowsheet Row CARDIAC REHAB PHASE II EXERCISE from 06/04/2024 in [facility] CARDIAC REHABILITATION  Picture Your Plate Total Score on Discharge 70   Picture Your Plate Scores: <59 Unhealthy dietary pattern with much room for improvement. 41-50 Dietary pattern unlikely to meet recommendations for good health and room for improvement. 51-60 More healthful dietary pattern, with some room for improvement.  >60 Healthy dietary pattern, although there may be some specific behaviors that could be improved.    Nutrition Goals Re-Evaluation:  Nutrition Goals Re-Evaluation     Row Name 04/30/24 1053 05/28/24 1041           Goals   Nutrition Goal healthy eating --      Comment Kassy is trying to eat healthier. She lives with her daughter and her family so she eats what they cook. They have been trying to cook healthier options. She will do chicken taco salad when they make tacos. She has been watching what she eats in moderation. She drinks mostly waters with about 2-3 sodas a week. She is a swwet person and loves choloclate but she has been trying to cut back. and eat in moderation Zamira is trying to eat healthier. She lives with her daughter and her  family so she eats what they cook. They have been trying to cook healthier options. She will do chicken taco salad when they make tacos. She has been watching what she eats in moderation. She drinks mostly water  with about 2-3 sodas a week.  She has cut out on her snacks, and she will do a protein shake for breakfast.      Expected Outcome Short: continue to watch sweets   long: contiune to pick healthy optiions Short: Continue to attend rehab. Long: Continue to enjoy foods but in moderation.         Nutrition Goals Discharge (Final Nutrition Goals Re-Evaluation):  Nutrition Goals Re-Evaluation - 05/28/24 1041       Goals   Comment Ysabella is trying to eat healthier. She lives with her daughter and her family so she eats what they cook. They have been trying to cook healthier options. She will do chicken taco salad when they make tacos. She has been watching what she eats in moderation. She drinks mostly water  with about 2-3 sodas a week.  She has cut out on her snacks, and she will do a protein shake for breakfast.    Expected Outcome Short: Continue to attend rehab. Long: Continue to enjoy foods but in moderation.          Psychosocial: Target Goals: Acknowledge presence or absence of significant depression and/or stress, maximize coping skills, provide positive support system. Participant is able to verbalize types and ability to use techniques and skills needed for reducing stress and depression.  Initial Review & Psychosocial Screening:  Initial Psych Review & Screening - 04/05/24 1127  Initial Review   Current issues with None Identified      Family Dynamics   Good Support System? Yes    Comments Patient's daughter supports her.      Barriers   Psychosocial barriers to participate in program The patient should benefit from training in stress management and relaxation.;There are no identifiable barriers or psychosocial needs.      Screening Interventions   Interventions  To provide support and resources with identified psychosocial needs;Encouraged to exercise;Provide feedback about the scores to participant    Expected Outcomes Short Term goal: Utilizing psychosocial counselor, staff and physician to assist with identification of specific Stressors or current issues interfering with healing process. Setting desired goal for each stressor or current issue identified.;Long Term Goal: Stressors or current issues are controlled or eliminated.;Short Term goal: Identification and review with participant of any Quality of Life or Depression concerns found by scoring the questionnaire.;Long Term goal: The participant improves quality of Life and PHQ9 Scores as seen by post scores and/or verbalization of changes          Quality of Life Scores:  Quality of Life - 06/04/24 1058       Quality of Life Scores   Health/Function Pre 20.73 %    Health/Function Post 27.1 %    Health/Function % Change 30.73 %    Socioeconomic Pre 25.31 %    Socioeconomic Post 30 %    Socioeconomic % Change  18.53 %    Psych/Spiritual Pre 28.21 %    Psych/Spiritual Post 30 %    Psych/Spiritual % Change 6.35 %    Family Pre 27 %    Family Post 30 %    Family % Change 11.11 %    GLOBAL Pre 24.17 %    GLOBAL Post 28.72 %    GLOBAL % Change 18.82 %         Scores of 19 and below usually indicate a poorer quality of life in these areas.  A difference of  2-3 points is a clinically meaningful difference.  A difference of 2-3 points in the total score of the Quality of Life Index has been associated with significant improvement in overall quality of life, self-image, physical symptoms, and general health in studies assessing change in quality of life.  PHQ-9: Review Flowsheet       06/04/2024 04/18/2024  Depression screen PHQ 2/9  Decreased Interest 0 3  Down, Depressed, Hopeless 0 0  PHQ - 2 Score 0 3  Altered sleeping 0 1  Tired, decreased energy 1 2  Change in appetite 0 0   Feeling bad or failure about yourself  0 0  Trouble concentrating 0 1  Moving slowly or fidgety/restless 0 0  Suicidal thoughts 0 0  PHQ-9 Score 1 7  Difficult doing work/chores Not difficult at all Not difficult at all   Interpretation of Total Score  Total Score Depression Severity:  1-4 = Minimal depression, 5-9 = Mild depression, 10-14 = Moderate depression, 15-19 = Moderately severe depression, 20-27 = Severe depression   Psychosocial Evaluation and Intervention:  Psychosocial Evaluation - 04/05/24 1128       Psychosocial Evaluation & Interventions   Interventions Relaxation education;Stress management education;Encouraged to exercise with the program and follow exercise prescription    Comments Patient was referred to cardiac rehab with angioplasty with stent placement. She denies any depression or anxiety. She says she sleeps okay. She lives with her daughter. She says she continues to have chest  pain that she was having prior to her stent. She has follow up with [facility] 11/6 and due to chest pain, we are moving her orientation visist to after this follow up OV. Patient goes to silver sneakers at the [facility] but can not return until her follow up. 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 She is not sure she will be able to participate but wants to try. Her OA pain may be a barrier. Her goals for the program are to get back to silver sneakers and learn about a healthy lifestyle.    Expected Outcomes Short Term: Patient will start the program and attend consistently. Long Term: Patient will complete the program meeting personal goals.    Continue Psychosocial Services  Follow up required by staff          Psychosocial Re-Evaluation:  Psychosocial Re-Evaluation     Row Name 04/30/24 1051 05/28/24 1042           Psychosocial Re-Evaluation   Current issues with None Identified None Identified      Comments Madeeha is doing well oin rehab. She has not sleep issues.  she uses her CPAP and has been doing well. She also does not have any major stressors in her life. She stated that its life and has everyday small stressors that do not bother her. Maryum is doing well oin rehab. She has not sleep issues. she uses her CPAP and has been doing well. She also does not have any major stressors in her life. She stated that its life and has everyday small stressors that do not bother her.      Expected Outcomes Short: continue to use CPAP for good sleep  long: exercise to help woth overall wellbeing Short: continue to use CPAP for good sleep  long: exercise to help woth overall wellbeing      Interventions Encouraged to attend Cardiac Rehabilitation for the exercise Encouraged to attend Cardiac Rehabilitation for the exercise      Continue Psychosocial Services  Follow up required by staff Follow up required by staff         Psychosocial Discharge (Final Psychosocial Re-Evaluation):  Psychosocial Re-Evaluation - 05/28/24 1042       Psychosocial Re-Evaluation   Current issues with None Identified    Comments Neshia is doing well oin rehab. She has not sleep issues. she uses her CPAP and has been doing well. She also does not have any major stressors in her life. She stated that its life and has everyday small stressors that do not bother her.    Expected Outcomes Short: continue to use CPAP for good sleep  long: exercise to help woth overall wellbeing    Interventions Encouraged to attend Cardiac Rehabilitation for the exercise    Continue Psychosocial Services  Follow up required by staff          Vocational Rehabilitation: Provide vocational rehab assistance to qualifying candidates.   Vocational Rehab Evaluation & Intervention:  Vocational Rehab - 04/05/24 1127       Initial Vocational Rehab Evaluation & Intervention   Assessment shows need for Vocational Rehabilitation No      Vocational Rehab Re-Evaulation   Comments Patient is retired.           Education: Education Goals: Education classes will be provided on a weekly basis, covering required topics. Participant will state understanding/return demonstration of topics presented.  Learning Barriers/Preferences:   Education Topics: Hypertension, Hypertension Reduction -Define heart  disease and high blood pressure. Discus how high blood pressure affects the body and ways to reduce high blood pressure.   Exercise and Your Heart -Discuss why it is important to exercise, the FITT principles of exercise, normal and abnormal responses to exercise, and how to exercise safely.   Angina -Discuss definition of angina, causes of angina, treatment of angina, and how to decrease risk of having angina.   Cardiac Medications -Review what the following cardiac medications are used for, how they affect the body, and side effects that may occur when taking the medications.  Medications include Aspirin , Beta blockers, calcium  channel blockers, ACE Inhibitors, angiotensin receptor blockers, diuretics, digoxin, and antihyperlipidemics.   Congestive Heart Failure -Discuss the definition of CHF, how to live with CHF, the signs and symptoms of CHF, and how keep track of weight and sodium intake.   Heart Disease and Intimacy -Discus the effect sexual activity has on the heart, how changes occur during intimacy as we age, and safety during sexual activity.   Smoking Cessation / COPD -Discuss different methods to quit smoking, the health benefits of quitting smoking, and the definition of COPD.   Nutrition I: Fats -Discuss the types of cholesterol, what cholesterol does to the heart, and how cholesterol levels can be controlled.   Nutrition II: Labels -Discuss the different components of food labels and how to read food label   Heart Parts/Heart Disease and PAD -Discuss the anatomy of the heart, the pathway of blood circulation through the heart, and these are affected by heart  disease.   Stress I: Signs and Symptoms -Discuss the causes of stress, how stress may lead to anxiety and depression, and ways to limit stress.   Stress II: Relaxation -Discuss different types of relaxation techniques to limit stress.   Warning Signs of Stroke / TIA -Discuss definition of a stroke, what the signs and symptoms are of a stroke, and how to identify when someone is having stroke.   Knowledge Questionnaire Score:  Knowledge Questionnaire Score - 06/04/24 1057       Knowledge Questionnaire Score   Post Score 20/24          Core Components/Risk Factors/Patient Goals at Admission:  Personal Goals and Risk Factors at Admission - 04/05/24 1127       Core Components/Risk Factors/Patient Goals on Admission    Weight Management Obesity    Diabetes Yes    Intervention Provide education about signs/symptoms and action to take for hypo/hyperglycemia.;Provide education about proper nutrition, including hydration, and aerobic/resistive exercise prescription along with prescribed medications to achieve blood glucose in normal ranges: Fasting glucose 65-99 mg/dL    Expected Outcomes Short Term: Participant verbalizes understanding of the signs/symptoms and immediate care of hyper/hypoglycemia, proper foot care and importance of medication, aerobic/resistive exercise and nutrition plan for blood glucose control.;Long Term: Attainment of HbA1C < 7%.    Hypertension Yes    Intervention Provide education on lifestyle modifcations including regular physical activity/exercise, weight management, moderate sodium restriction and increased consumption of fresh fruit, vegetables, and low fat dairy, alcohol  moderation, and smoking cessation.;Monitor prescription use compliance.    Expected Outcomes Short Term: Continued assessment and intervention until BP is < 140/8mm HG in hypertensive participants. < 130/39mm HG in hypertensive participants with diabetes, heart failure or chronic kidney  disease.    Lipids Yes    Intervention Provide education and support for participant on nutrition & aerobic/resistive exercise along with prescribed medications to achieve LDL 70mg , HDL >40mg .  Expected Outcomes Short Term: Participant states understanding of desired cholesterol values and is compliant with medications prescribed. Participant is following exercise prescription and nutrition guidelines.;Long Term: Cholesterol controlled with medications as prescribed, with individualized exercise RX and with personalized nutrition plan. Value goals: LDL < 70mg , HDL > 40 mg.          Core Components/Risk Factors/Patient Goals Review:   Goals and Risk Factor Review     Row Name 04/30/24 1056 05/28/24 1039           Core Components/Risk Factors/Patient Goals Review   Personal Goals Review Weight Management/Obesity;Hypertension Weight Management/Obesity;Hypertension;Diabetes      Review Dehlia is doing well in rehab. She has been watching her food options and picking healthier options. She has been monitoing her BP and numbers have been WNL at rehab and at home. She takes her medications and has had no bad side effects to them. Chasitie is doing well in rehab! She checks her blood sugar everyday and her BP at least 2/3 times per week. She takes all her medicines as prescribed.      Expected Outcomes Short: continue to attend rehab    long: continue to monitor BP and take medications Short: continue to attend rehab    long: continue to monitor BP and take medications         Core Components/Risk Factors/Patient Goals at Discharge (Final Review):   Goals and Risk Factor Review - 05/28/24 1039       Core Components/Risk Factors/Patient Goals Review   Personal Goals Review Weight Management/Obesity;Hypertension;Diabetes    Review Sadira is doing well in rehab! She checks her blood sugar everyday and her BP at least 2/3 times per week. She takes all her medicines as prescribed.    Expected  Outcomes Short: continue to attend rehab    long: continue to monitor BP and take medications          ITP Comments:  ITP Comments     Row Name 04/05/24 1139 04/18/24 1127 04/23/24 1030 05/14/24 1057 06/11/24 1442   ITP Comments Virtual orientation visit completed for cardiac rehab with angioplasty with stent placement. On-site orientation visit scheduled for 04/18/24 at 10:30 pending clearance from Almarie Crate, NP. Patient arrived for 1st visit/orientation/education at 1030. Patient was referred to CR by Dr. Alexandria. Alvan attending due to PTCA with stent. During orientation advised patient on arrival and appointment times what to wear, what to do before, during and after exercise. Reviewed attendance and class policy.  Pt is scheduled to return Cardiac Rehab on 04/23/24 at 10:30. Pt was advised to come to class 15 minutes before class starts.  Discussed RPE/Dpysnea scales. Patient participated in warm up stretches. Patient was able to complete 6 minute walk test.  Telemetry:NSR Patient was measured for the equipment. Discussed equipment safety with patient. Took patient pre-anthropometric measurements. Patient finished visit at 1127. First full day of exercise!  Patient was oriented to gym and equipment including functions, settings, policies, and procedures.  Patient's individual exercise prescription and treatment plan were reviewed.  All starting workloads were established based on the results of the 6 minute walk test done at initial orientation visit.  The plan for exercise progression was also introduced and progression will be customized based on patient's performance and goals. 30 day review completed. ITP sent to Dr. Dorn Alvan, Medical Director of Cardiac Rehab. Continue with ITP unless changes are made by physician. 30 day review completed. ITP sent to Dr. Dorn Alvan, Medical Director  of Cardiac Rehab. Continue with ITP unless changes are made by physician. Pt stated that she  wanted to graduate on 06/04/24 but upon leaving said she may be back if Dr. Alvan said she should continue program, however her appt got moved to 06/22/23, will wait til then for return to rehab.    Row Name 07/09/24 1146           ITP Comments Pt did not return after f/u appt.  Will discharge at this time.          Comments: Discharge ITP    [1]  Current Outpatient Medications:    allopurinol  (ZYLOPRIM ) 300 MG tablet, Take 300 mg by mouth at bedtime., Disp: , Rfl:    apixaban  (ELIQUIS ) 5 MG TABS tablet, Take 1 tablet (5 mg total) by mouth 2 (two) times daily., Disp: , Rfl:    benzonatate (TESSALON) 200 MG capsule, Take 200 mg by mouth 3 (three) times daily as needed for cough., Disp: , Rfl:    BREO ELLIPTA  100-25 MCG/ACT AEPB, TAKE 1 PUFF BY MOUTH EVERY DAY (Patient not taking: Reported on 07/01/2024), Disp: 60 each, Rfl: 5   clopidogrel  (PLAVIX ) 75 MG tablet, Take 1 tablet (75 mg total) by mouth daily., Disp: 30 tablet, Rfl: 5   dapagliflozin propanediol (FARXIGA) 5 MG TABS tablet, Take 5 mg by mouth every Monday, Wednesday, and Friday., Disp: , Rfl:    Erenumab -aooe (AIMOVIG ) 140 MG/ML SOAJ, Inject 140 mg into the skin every 28 (twenty-eight) days., Disp: 1.12 mL, Rfl: 11   furosemide  (LASIX ) 20 MG tablet, Take 1-2 tablets (20-40 mg total) by mouth See admin instructions. Take 20 mg by mouth in the morning and 40 mg in the evening, Disp: , Rfl:    isosorbide  mononitrate (IMDUR ) 120 MG 24 hr tablet, Take 1 tablet (120 mg total) by mouth daily., Disp: 90 tablet, Rfl: 1   isosorbide  mononitrate (IMDUR ) 60 MG 24 hr tablet, Take 1 tablet (60 mg total) by mouth daily. Take in addition to 120 mg for a total of 180 mg daily, Disp: 90 tablet, Rfl: 3   ketoconazole (NIZORAL) 2 % cream, Apply 1 Application topically daily as needed for irritation., Disp: , Rfl:    ketoconazole (NIZORAL) 2 % shampoo, Apply 1 Application topically daily as needed for irritation., Disp: , Rfl:    Lancets (ONETOUCH  DELICA PLUS LANCET33G) MISC, SMARTSIG:11.9 Topical Daily, Disp: , Rfl:    MAGNESIUM  PO, Take 1 tablet by mouth daily., Disp: , Rfl:    metoprolol  succinate (TOPROL -XL) 25 MG 24 hr tablet, Take 25 mg by mouth daily., Disp: , Rfl:    montelukast (SINGULAIR) 10 MG tablet, Take 10 mg by mouth at bedtime., Disp: , Rfl:    Omega-3 Fatty Acids (FISH OIL PO), Take 1 capsule by mouth daily., Disp: , Rfl:    pantoprazole  (PROTONIX ) 40 MG tablet, Take 1 tablet (40 mg total) by mouth daily., Disp: 30 tablet, Rfl: 1   rosuvastatin  (CRESTOR ) 10 MG tablet, Take 1 tablet (10 mg total) by mouth every evening., Disp: 30 tablet, Rfl: 1   spironolactone  (ALDACTONE ) 25 MG tablet, Take 1 tablet (25 mg total) by mouth every Tuesday, Thursday, Saturday, and Sunday., Disp: , Rfl:    Ubrogepant  (UBRELVY ) 100 MG TABS, Samples of this drug were given to the patient, quantity 3, Lot Number [id number] exp6/27  Samples of this drug were given to the patient, quantity 1, Lot Number [id number] exp 04/28, Disp: , Rfl:    venlafaxine   XR (EFFEXOR  XR) 37.5 MG 24 hr capsule, Take 1 capsule (37.5 mg total) by mouth daily with breakfast., Disp: 30 capsule, Rfl: 5   vitamin C (ASCORBIC ACID) 250 MG tablet, Take 250 mg by mouth daily., Disp: , Rfl:    vitamin E 200 UNIT capsule, Take 200 Units by mouth daily., Disp: , Rfl:  [2]  Social History Tobacco Use  Smoking Status Never   Passive exposure: Past  Smokeless Tobacco Never

## 2024-07-09 NOTE — Progress Notes (Signed)
 Discharge Progress Report  Patient Details  Name: Lauren Hamilton MRN: [id number] Date of Birth: 1946-04-24 Referring Provider:   Flowsheet Row CARDIAC REHAB PHASE II ORIENTATION from 04/18/2024 in [facility] CARDIAC REHABILITATION  Referring Provider Ladona Heinz MD     Number of Visits: 16  Reason for Discharge:  Early Exit:  Personal  Smoking History:  Tobacco Use History[1]  Diagnosis:  Status post angioplasty with stent     Initial Exercise Prescription:  Initial Exercise Prescription - 04/18/24 1200       Date of Initial Exercise RX and Referring Provider   Date 04/18/24    Referring Provider Ladona Heinz MD      Treadmill   MPH 1.4    Grade 0    Minutes 15    METs 2.07      NuStep   Level 1    SPM 60    Minutes 15    METs 1.8      Prescription Details   Frequency (times per week) 2    Duration Progress to 30 minutes of continuous aerobic without signs/symptoms of physical distress      Intensity   THRR 40-80% of Max Heartrate 113-133    Ratings of Perceived Exertion 11-13    Perceived Dyspnea 0-4      Resistance Training   Training Prescription Yes    Weight 4    Reps 10-15          Discharge Exercise Prescription (Final Exercise Prescription Changes):  Exercise Prescription Changes - 06/04/24 1000       Home Exercise Plan   Plans to continue exercise at Home (comment)    Frequency Add 3 additional days to program exercise sessions.    Initial Home Exercises Provided 06/04/24          Functional Capacity:  6 Minute Walk     Row Name 04/18/24 1158 06/04/24 1049       6 Minute Walk   Phase Initial Discharge    Distance 820 feet 1000 feet    Distance % Change -- 22 %    Distance Feet Change -- 180 ft    Walk Time 6 minutes 6 minutes    # of Rest Breaks 0 0    MPH 1.55 1.9    METS 1.33 1.7    RPE 13 13    Perceived Dyspnea  1 1    VO2 Peak 4.66 6.2    Symptoms No No    Resting HR 93 bpm 105 bpm    Resting BP 126/60 140/64     Resting Oxygen  Saturation  96 % 96 %    Exercise Oxygen  Saturation  during 6 min walk 94 % 96 %    Max Ex. HR 110 bpm 105 bpm    Max Ex. BP 140/70 160/70    2 Minute Post BP 128/70 --       Psychological, QOL, Others - Outcomes: PHQ 2/9:    06/04/2024   10:59 AM 04/18/2024   11:27 AM  Depression screen PHQ 2/9  Decreased Interest 0 3  Down, Depressed, Hopeless 0 0  PHQ - 2 Score 0 3  Altered sleeping 0 1  Tired, decreased energy 1 2  Change in appetite 0 0  Feeling bad or failure about yourself  0 0  Trouble concentrating 0 1  Moving slowly or fidgety/restless 0 0  Suicidal thoughts 0 0  PHQ-9 Score 1 7  Difficult doing work/chores Not difficult  at all Not difficult at all    Quality of Life:  Quality of Life - 06/04/24 1058       Quality of Life Scores   Health/Function Pre 20.73 %    Health/Function Post 27.1 %    Health/Function % Change 30.73 %    Socioeconomic Pre 25.31 %    Socioeconomic Post 30 %    Socioeconomic % Change  18.53 %    Psych/Spiritual Pre 28.21 %    Psych/Spiritual Post 30 %    Psych/Spiritual % Change 6.35 %    Family Pre 27 %    Family Post 30 %    Family % Change 11.11 %    GLOBAL Pre 24.17 %    GLOBAL Post 28.72 %    GLOBAL % Change 18.82 %             Nutrition & Weight - Outcomes:  Pre Biometrics - 04/18/24 1203       Pre Biometrics   Height 5' 6 (1.676 m)    Weight 225 lb 12 oz (102.4 kg)    Waist Circumference 41 inches    Hip Circumference 52 inches    Waist to Hip Ratio 0.79 %    BMI (Calculated) 36.45    Single Leg Stand 0 seconds          Post Biometrics - 06/04/24 1053        Post  Biometrics   Height 5' 6 (1.676 m)    Weight 223 lb 5.2 oz (101.3 kg)    Waist Circumference 40 inches    Hip Circumference 52 inches    Waist to Hip Ratio 0.77 %    BMI (Calculated) 36.06    Grip Strength 18.2 kg    Single Leg Stand 0 seconds             [1]  Social History Tobacco Use  Smoking Status Never    Passive exposure: Past  Smokeless Tobacco Never

## 2024-07-12 ENCOUNTER — Other Ambulatory Visit: Payer: Self-pay | Admitting: Neurology

## 2024-07-12 ENCOUNTER — Telehealth: Payer: Self-pay

## 2024-07-12 MED ORDER — UBRELVY 100 MG PO TABS: 1.0000 | ORAL_TABLET | ORAL | 11 refills | Status: AC | PRN

## 2024-07-12 NOTE — Telephone Encounter (Signed)
 DR.Jaffe per patient the ubrelvy  worked wonders please send in script to the pharmacy.   PA team can we start a PA for ubrelvy 

## 2024-07-12 NOTE — Telephone Encounter (Signed)
 Patient advised of Dr.jaffe note, Prescription for Ubrelvy  100mg  has been sent to the [facility] on [address] in [place]

## 2024-07-12 NOTE — Telephone Encounter (Signed)
 Pharmacy Patient Advocate Encounter  Received notification from [facility] [employer] that Prior Authorization for AIMOVIG  140MG   has been APPROVED from 07-10-2024 to 01-01-2025   PA #/Case ID/Reference #: [id number]

## 2024-07-16 ENCOUNTER — Telehealth: Payer: Self-pay | Admitting: Pharmacy Technician

## 2024-07-16 ENCOUNTER — Other Ambulatory Visit (HOSPITAL_COMMUNITY): Payer: Self-pay

## 2024-07-16 NOTE — Telephone Encounter (Signed)
 Per Macario at the pharmacy, patient has an deductible $500 which hopefully this time it will go towards the it and bring the cost down. But patient did pick up this month.   Per patient the ubrelvy  is Tier 5 on her insurance. Will see if she can apply for the Medicare Assistance program for Prescriptions.

## 2024-07-16 NOTE — Telephone Encounter (Signed)
 PA has been submitted, and telephone encounter has been created. Please see telephone encounter dated 2.3.26.  Tier exception can be tried, but deductible amount will not change and would have to be met.

## 2024-07-16 NOTE — Telephone Encounter (Signed)
 Pharmacy Patient Advocate Encounter   Received notification from Pt Calls Messages that prior authorization for UBRELVY  100MG  is required/requested.   Insurance verification completed.   The patient is insured through [facility] [employer]VANT[employer]/RX ADVANCE.   Per test claim: PA required; PA submitted to above mentioned insurance via Latent Key/confirmation #/EOC [id number] Status is pending

## 2024-07-16 NOTE — Telephone Encounter (Signed)
 Lauren Hamilton stating that she would like to speak with the nurse regarding Rx(Ubrelvy ). She stated the drug store is going to want $350.00 for a 30 day plus doctor authorization. She is not sure if she is able to afford that.   PH: [phone]

## 2024-07-17 ENCOUNTER — Telehealth: Payer: Self-pay | Admitting: Neurology

## 2024-07-17 NOTE — Telephone Encounter (Signed)
 Pt called in this afternoon and  she stated that her insurance called [employer] did approve the prescription called :   Ubrogepant  (UBRELVY ) 100 MG TABS  , but it will be $338.00. Pt stated that [employer] stated that our office could File a Grief Disclosure and then Pt may be able to get a discount of the prescription. Thanks.

## 2024-07-17 NOTE — Telephone Encounter (Signed)
 See note 07/16/24: PA has been submitted, and telephone encounter has been created. Please see telephone encounter dated 2.3.26.   Tier exception can be tried, but deductible amount will not change and would have to be met.

## 2024-09-24 ENCOUNTER — Ambulatory Visit: Admitting: Adult Health

## 2024-11-14 ENCOUNTER — Ambulatory Visit: Admitting: Cardiology

## 2024-11-19 ENCOUNTER — Ambulatory Visit: Admitting: Cardiology

## 2024-12-24 ENCOUNTER — Ambulatory Visit: Payer: Self-pay | Admitting: Neurology
